# Patient Record
Sex: Male | Born: 1935 | Race: White | Hispanic: No | Marital: Married | Smoking: Former smoker
Health system: Southern US, Community
[De-identification: ages and names within clinical notes are randomized; demographics above are authoritative.]

## PROBLEM LIST (undated history)

## (undated) DIAGNOSIS — R351 Nocturia: Secondary | ICD-10-CM

## (undated) DIAGNOSIS — C61 Malignant neoplasm of prostate: Secondary | ICD-10-CM

## (undated) DIAGNOSIS — N3281 Overactive bladder: Secondary | ICD-10-CM

## (undated) DIAGNOSIS — E119 Type 2 diabetes mellitus without complications: Secondary | ICD-10-CM

## (undated) DIAGNOSIS — K8689 Other specified diseases of pancreas: Secondary | ICD-10-CM

## (undated) DIAGNOSIS — R32 Unspecified urinary incontinence: Secondary | ICD-10-CM

## (undated) DIAGNOSIS — S72001A Fracture of unspecified part of neck of right femur, initial encounter for closed fracture: Secondary | ICD-10-CM

## (undated) DIAGNOSIS — N400 Enlarged prostate without lower urinary tract symptoms: Secondary | ICD-10-CM

## (undated) HISTORY — DX: Unspecified urinary incontinence: R32

## (undated) HISTORY — PX: CATARACT EXTRACTION, BILATERAL: SHX1313

## (undated) HISTORY — PX: CHOLECYSTECTOMY: SHX55

## (undated) HISTORY — DX: Overactive bladder: N32.81

## (undated) HISTORY — DX: Nocturia: R35.1

## (undated) HISTORY — PX: HERNIA REPAIR: SHX51

## (undated) HISTORY — DX: Malignant neoplasm of prostate: C61

## (undated) HISTORY — DX: Benign prostatic hyperplasia without lower urinary tract symptoms: N40.0

## (undated) HISTORY — PX: PROSTATECTOMY: SHX69

---

## 2005-08-04 ENCOUNTER — Ambulatory Visit: Payer: Self-pay | Admitting: Oncology

## 2006-02-05 ENCOUNTER — Ambulatory Visit: Payer: Self-pay | Admitting: Radiation Oncology

## 2010-09-03 ENCOUNTER — Ambulatory Visit: Payer: Self-pay | Admitting: Ophthalmology

## 2010-09-18 ENCOUNTER — Ambulatory Visit: Payer: Self-pay | Admitting: Ophthalmology

## 2013-04-08 ENCOUNTER — Emergency Department: Payer: Self-pay | Admitting: Emergency Medicine

## 2014-08-25 DIAGNOSIS — I429 Cardiomyopathy, unspecified: Secondary | ICD-10-CM | POA: Insufficient documentation

## 2014-08-25 DIAGNOSIS — Z8546 Personal history of malignant neoplasm of prostate: Secondary | ICD-10-CM | POA: Insufficient documentation

## 2015-03-15 ENCOUNTER — Encounter: Payer: Self-pay | Admitting: Urology

## 2015-03-15 ENCOUNTER — Ambulatory Visit (INDEPENDENT_AMBULATORY_CARE_PROVIDER_SITE_OTHER): Payer: Medicare Other | Admitting: Urology

## 2015-03-15 VITALS — BP 169/74 | HR 65 | Ht 69.0 in | Wt 159.8 lb

## 2015-03-15 DIAGNOSIS — C61 Malignant neoplasm of prostate: Secondary | ICD-10-CM

## 2015-03-15 DIAGNOSIS — N3281 Overactive bladder: Secondary | ICD-10-CM

## 2015-03-15 DIAGNOSIS — N393 Stress incontinence (female) (male): Secondary | ICD-10-CM | POA: Diagnosis not present

## 2015-03-15 LAB — URINALYSIS, COMPLETE
BILIRUBIN UA: NEGATIVE
GLUCOSE, UA: NEGATIVE
KETONES UA: NEGATIVE
Leukocytes, UA: NEGATIVE
NITRITE UA: NEGATIVE
Protein, UA: NEGATIVE
UUROB: 0.2 mg/dL (ref 0.2–1.0)
pH, UA: 5 (ref 5.0–7.5)

## 2015-03-15 LAB — MICROSCOPIC EXAMINATION: Bacteria, UA: NONE SEEN

## 2015-03-15 NOTE — Progress Notes (Signed)
03/15/2015 3:35 PM   Gerald Alvarez. March 02, 1926 [id number]  Referring provider: Rusty Aus, MD [place], [place] [address]  Chief Complaint  Patient presents with  . Prostate Cancer    1 yr f/u. Pt complains that his urinary incontinence has worsen . the pt also complains of rash around he penis     HPI: Patient will has a long history of status post radical prostatectomy for prostate cancer with negative PSAs the last 20 years. Has some problems with stress incontinence and overactive bladder does not leak at night does get up several times at night. I gave him samples of mybetriq 25 mg is as been the only anticholinergic that helped him. The other anticholinergics caused too much constipation and he wants to have a bowel movement daily basis.     PMH: Past Medical History  Diagnosis Date  . OAB (overactive bladder)   . Incontinence   . Nocturia   . BPH (benign prostatic hyperplasia)   . Prostate cancer     Surgical History: Past Surgical History  Procedure Laterality Date  . Cholecystectomy    . Hernia repair      Home Medications:    Medication List       This list is accurate as of: 03/15/15  3:35 PM.  Always use your most recent med list.               ALPRAZolam 0.5 MG tablet  Commonly known as:  XANAX  Take by mouth.     aspirin 81 MG chewable tablet  Chew by mouth.     Azelastine HCl 0.15 % Soln  Place into the nose.     metoprolol tartrate 25 MG tablet  Commonly known as:  LOPRESSOR  Take by mouth.     MULTI-DAY VITAMINS Tabs  Take by mouth.     pantoprazole 40 MG tablet  Commonly known as:  PROTONIX  TAKE 1 TABLET TWICE A DAY        Allergies:  Allergies  Allergen Reactions  . Sulfa Antibiotics Nausea And Vomiting    Family History: No family history on file.  Social History:  reports that he quit smoking about 48 years ago. He does not have any smokeless tobacco history on file. He reports that he does  not drink alcohol or use illicit drugs.  BSJ:[id number] Symptom Review    Patient is experiencing the following symptoms: Frequent urination Leakage of urine Weak stream Erection problems (male only)   Review of Systems  Gastrointestinal (upper)  : Negative for upper GI symptoms  Gastrointestinal (lower) : Negative for lower GI symptoms  Constitutional : Negative for symptoms  Skin: Negative for skin symptoms  Eyes: Negative for eye symptoms  Ear/Nose/Throat : Negative for Ear/Nose/Throat symptoms  Hematologic/Lymphatic: Negative for Hematologic/Lymphatic symptoms  Cardiovascular : Negative for cardiovascular symptoms  Respiratory : Negative for respiratory symptoms  Endocrine: Negative for endocrine symptoms  Musculoskeletal: Back pain  Neurological: Negative for neurological symptoms  Psychologic: Anxiety   Physical Exam: BP 169/74 mmHg  Pulse 65  Ht 5\' 9"  (1.753 m)  Wt 159 lb 12.8 oz (72.485 kg)  BMI 23.59 kg/m2  Constitutional:  Alert and oriented, No acute distress. HEENT: [name] AT, moist mucus membranes.  Trachea midline, no masses. Cardiovascular: No clubbing, cyanosis, or edema. Respiratory: Normal respiratory effort, no increased work of breathing. GI: Abdomen is soft, nontender, nondistended, no abdominal masses GU: No CVA tenderness. Prostate absent rectal sphincter  good noncircumcised but retractable foreskin testes descended and normal Skin: No rashes, bruises or suspicious lesions. Lymph: No cervical or inguinal adenopathy. Neurologic: Grossly intact, no focal deficits, moving all 4 extremities. Psychiatric: Normal mood and affect.  Laboratory Data: No results found for: WBC, HGB, HCT, MCV, PLT  No results found for: CREATININE  No results found for: PSA  No results found for: TESTOSTERONE  No results found for: HGBA1C  Urinalysis No results found for: COLORURINE, APPEARANCEUR, LABSPEC, PHURINE, GLUCOSEU, HGBUR,  BILIRUBINUR, KETONESUR, PROTEINUR, UROBILINOGEN, NITRITE, LEUKOCYTESUR  Pertinent Imaging: None   Assessment & Plan:  status post prostatectomy with no recurrence of disease and a negative rectal exam. PSA drawn.  1. Prostate cancer  Await results- PSA  2. Stress incontinence  - continued stress incontinence but not at night. Overactive bladde  only at night Urinalysis, Complete  3. OAB (overactive bladder)    No Follow-up on file.  Collier Flowers, [facility] [address] [place], [place] [address] [phone]

## 2015-03-16 LAB — PSA

## 2016-03-10 ENCOUNTER — Encounter: Payer: Self-pay | Admitting: Urology

## 2016-03-10 ENCOUNTER — Ambulatory Visit (INDEPENDENT_AMBULATORY_CARE_PROVIDER_SITE_OTHER): Payer: Medicare Other | Admitting: Urology

## 2016-03-10 VITALS — BP 183/72 | HR 64 | Ht 67.0 in | Wt 152.0 lb

## 2016-03-10 DIAGNOSIS — C61 Malignant neoplasm of prostate: Secondary | ICD-10-CM

## 2016-03-10 DIAGNOSIS — N200 Calculus of kidney: Secondary | ICD-10-CM | POA: Insufficient documentation

## 2016-03-10 DIAGNOSIS — H9313 Tinnitus, bilateral: Secondary | ICD-10-CM | POA: Insufficient documentation

## 2016-03-10 DIAGNOSIS — N3281 Overactive bladder: Secondary | ICD-10-CM

## 2016-03-10 DIAGNOSIS — N393 Stress incontinence (female) (male): Secondary | ICD-10-CM | POA: Diagnosis not present

## 2016-03-10 DIAGNOSIS — K5790 Diverticulosis of intestine, part unspecified, without perforation or abscess without bleeding: Secondary | ICD-10-CM | POA: Insufficient documentation

## 2016-03-10 MED ORDER — MIRABEGRON ER 25 MG PO TB24: 25.0000 mg | ORAL_TABLET | Freq: Every day | ORAL | Status: DC

## 2016-03-10 NOTE — Progress Notes (Signed)
03/10/2016 10:02 AM   Gerald Alvarez. December 16, 1925 [id number]  Referring provider: No referring provider defined for this encounter.  Chief Complaint  Patient presents with  . Prostate Cancer    1year     HPI: The patient is a 82 year old male with a history of a radical prostatectomy approximately 20 years ago presents for annual follow-up. His pathology is not available to me at this time. However, he had a PSA drawn last year that was undetectable.    He has stress urinary incontinence. He uses 3-4 pads daily. He wears one at night. Does not leak at night. He also has urinary frequency. He has been on Myrbetriq in the past and this is helped with his leakage as well as his frequency. He does however denied urge incontinence. He was not able to afford his medications would not purchase. He was unable to tolerate anticholinergics severe problems with constipation. He would like to try samples of this medication again.   PMH: Past Medical History  Diagnosis Date  . OAB (overactive bladder)   . Incontinence   . Nocturia   . BPH (benign prostatic hyperplasia)   . Prostate cancer [facility])     Surgical History: Past Surgical History  Procedure Laterality Date  . Cholecystectomy    . Hernia repair      Home Medications:    Medication List       This list is accurate as of: 03/10/16 10:02 AM.  Always use your most recent med list.               ALPRAZolam 0.5 MG tablet  Commonly known as:  XANAX  Take by mouth.     aspirin 81 MG chewable tablet  Chew by mouth.     metoprolol tartrate 25 MG tablet  Commonly known as:  LOPRESSOR  Take by mouth.     MULTI-DAY VITAMINS Tabs  Take by mouth.     pantoprazole 40 MG tablet  Commonly known as:  PROTONIX  TAKE 1 TABLET TWICE A DAY        Allergies:  Allergies  Allergen Reactions  . Sulfa Antibiotics Nausea And Vomiting    Family History: History reviewed. No pertinent family history.  Social History:   reports that he quit smoking about 49 years ago. He does not have any smokeless tobacco history on file. He reports that he does not drink alcohol or use illicit drugs.  ROS: UROLOGY Frequent Urination?: No Hard to postpone urination?: No Burning/pain with urination?: No Get up at night to urinate?: No Leakage of urine?: Yes Urine stream starts and stops?: No Trouble starting stream?: No Do you have to strain to urinate?: No Blood in urine?: No Urinary tract infection?: No Sexually transmitted disease?: No Injury to kidneys or bladder?: No Painful intercourse?: No Weak stream?: No Erection problems?: No Penile pain?: No  Gastrointestinal Nausea?: No Vomiting?: No Indigestion/heartburn?: No Diarrhea?: No Constipation?: No  Constitutional Fever: No Night sweats?: No Weight loss?: No Fatigue?: No  Skin Skin rash/lesions?: No Itching?: No  Eyes Blurred vision?: No Double vision?: No  Ears/Nose/Throat Sore throat?: No Sinus problems?: No  Hematologic/Lymphatic Swollen glands?: No Easy bruising?: No  Cardiovascular Leg swelling?: No Chest pain?: No  Respiratory Cough?: No Shortness of breath?: No  Endocrine Excessive thirst?: No  Musculoskeletal Back pain?: No Joint pain?: No  Neurological Headaches?: No Dizziness?: No  Psychologic Depression?: No Anxiety?: No  Physical Exam: BP 183/72 mmHg  Pulse 64  Ht 5\' 7"  (1.702 m)  Wt 152 lb (68.947 kg)  BMI 23.80 kg/m2  Constitutional:  Alert and oriented, No acute distress. HEENT: [place] AT, moist mucus membranes.  Trachea midline, no masses. Cardiovascular: No clubbing, cyanosis, or edema. Respiratory: Normal respiratory effort, no increased work of breathing. GI: Abdomen is soft, nontender, nondistended, no abdominal masses GU: No CVA tenderness. Uncircumcised phallus. Mild irritation of glans. No sign of infection. Skin: No rashes, bruises or suspicious lesions. Lymph: No cervical or inguinal  adenopathy. Neurologic: Grossly intact, no focal deficits, moving all 4 extremities. Psychiatric: Normal mood and affect.  Laboratory Data: No results found for: WBC, HGB, HCT, MCV, PLT  No results found for: CREATININE  No results found for: PSA  No results found for: TESTOSTERONE  No results found for: HGBA1C  Urinalysis    Component Value Date/Time   APPEARANCEUR Clear 03/15/2015 1517   GLUCOSEU Negative 03/15/2015 1517   BILIRUBINUR Negative 03/15/2015 1517   PROTEINUR Negative 03/15/2015 1517   NITRITE Negative 03/15/2015 1517   LEUKOCYTESUR Negative 03/15/2015 1517     Assessment & Plan:    1. History of prostate cancer -check PSA today  2. Overactive bladder -Patient was given samples of Myrbetric as this has worked in the past. A Rx was sent to his pharmacy for Myrbetriq 25 mg daily.  3. SUI -continue pads    Return in about 1 year (around 03/10/2017).  Nickie Retort, MD  [facility] [address] [place], [place] [address] [phone]

## 2016-03-11 LAB — PSA: Prostate Specific Ag, Serum: <0.1 ng/mL (ref 0.0–4.0)

## 2016-03-14 ENCOUNTER — Ambulatory Visit: Payer: Self-pay | Admitting: Urology

## 2016-03-14 ENCOUNTER — Ambulatory Visit: Payer: Self-pay

## 2016-03-14 ENCOUNTER — Telehealth: Payer: Self-pay

## 2016-03-14 NOTE — Telephone Encounter (Signed)
-----   Message from Nickie Retort, MD sent at 03/13/2016 10:19 AM EDT ----- Please let patient know PSA remains undetectable. F/u as scheduled

## 2016-03-14 NOTE — Telephone Encounter (Signed)
LMOM- most recent labs are undetectable.

## 2016-07-18 ENCOUNTER — Ambulatory Visit
Admission: RE | Admit: 2016-07-18 | Discharge: 2016-07-18 | Disposition: A | Discharge status: Home / Self Care | Payer: Medicare Other | Source: Ambulatory Visit | Attending: Internal Medicine | Admitting: Internal Medicine

## 2016-07-18 ENCOUNTER — Other Ambulatory Visit: Payer: Self-pay | Admitting: Internal Medicine

## 2016-07-18 DIAGNOSIS — R55 Syncope and collapse: Secondary | ICD-10-CM | POA: Diagnosis not present

## 2016-07-18 DIAGNOSIS — I6523 Occlusion and stenosis of bilateral carotid arteries: Secondary | ICD-10-CM | POA: Insufficient documentation

## 2016-09-04 DIAGNOSIS — Z0000 Encounter for general adult medical examination without abnormal findings: Secondary | ICD-10-CM | POA: Insufficient documentation

## 2016-09-09 ENCOUNTER — Other Ambulatory Visit: Payer: Self-pay

## 2016-10-16 ENCOUNTER — Ambulatory Visit: Admission: RE | Admit: 2016-10-16 | Payer: Medicare Other | Source: Ambulatory Visit

## 2016-10-16 ENCOUNTER — Ambulatory Visit
Admission: RE | Admit: 2016-10-16 | Discharge: 2016-10-16 | Disposition: A | Discharge status: Home / Self Care | Payer: Medicare Other | Source: Ambulatory Visit | Attending: Internal Medicine | Admitting: Internal Medicine

## 2016-10-16 ENCOUNTER — Other Ambulatory Visit: Payer: Self-pay | Admitting: Internal Medicine

## 2016-10-16 DIAGNOSIS — R1084 Generalized abdominal pain: Secondary | ICD-10-CM | POA: Diagnosis not present

## 2016-10-16 DIAGNOSIS — I700 Atherosclerosis of aorta: Secondary | ICD-10-CM | POA: Diagnosis not present

## 2016-10-16 DIAGNOSIS — M438X4 Other specified deforming dorsopathies, thoracic region: Secondary | ICD-10-CM | POA: Insufficient documentation

## 2016-10-16 DIAGNOSIS — K769 Liver disease, unspecified: Secondary | ICD-10-CM | POA: Diagnosis not present

## 2016-10-16 MED ORDER — IOPAMIDOL (ISOVUE-300) INJECTION 61%
100.0000 mL | Freq: Once | INTRAVENOUS | Status: AC | PRN
  Administered 2016-10-16: 100 mL via INTRAVENOUS

## 2016-10-23 DIAGNOSIS — E538 Deficiency of other specified B group vitamins: Secondary | ICD-10-CM | POA: Insufficient documentation

## 2016-12-31 ENCOUNTER — Other Ambulatory Visit: Payer: Self-pay | Admitting: Internal Medicine

## 2016-12-31 DIAGNOSIS — R1031 Right lower quadrant pain: Secondary | ICD-10-CM

## 2017-01-04 ENCOUNTER — Emergency Department
Admission: EM | Admit: 2017-01-04 | Discharge: 2017-01-04 | Disposition: A | Discharge status: Home / Self Care | Payer: Medicare Other | Attending: Emergency Medicine | Admitting: Emergency Medicine

## 2017-01-04 ENCOUNTER — Emergency Department: Payer: Medicare Other

## 2017-01-04 ENCOUNTER — Encounter: Payer: Self-pay | Admitting: Radiology

## 2017-01-04 DIAGNOSIS — Z8546 Personal history of malignant neoplasm of prostate: Secondary | ICD-10-CM | POA: Diagnosis not present

## 2017-01-04 DIAGNOSIS — M4854XA Collapsed vertebra, not elsewhere classified, thoracic region, initial encounter for fracture: Secondary | ICD-10-CM | POA: Insufficient documentation

## 2017-01-04 DIAGNOSIS — Z7982 Long term (current) use of aspirin: Secondary | ICD-10-CM | POA: Diagnosis not present

## 2017-01-04 DIAGNOSIS — R0789 Other chest pain: Secondary | ICD-10-CM | POA: Diagnosis not present

## 2017-01-04 DIAGNOSIS — Z87891 Personal history of nicotine dependence: Secondary | ICD-10-CM | POA: Diagnosis not present

## 2017-01-04 DIAGNOSIS — R1084 Generalized abdominal pain: Secondary | ICD-10-CM | POA: Insufficient documentation

## 2017-01-04 DIAGNOSIS — S22000A Wedge compression fracture of unspecified thoracic vertebra, initial encounter for closed fracture: Secondary | ICD-10-CM

## 2017-01-04 DIAGNOSIS — R109 Unspecified abdominal pain: Secondary | ICD-10-CM

## 2017-01-04 DIAGNOSIS — Z79899 Other long term (current) drug therapy: Secondary | ICD-10-CM | POA: Insufficient documentation

## 2017-01-04 LAB — URINALYSIS, ROUTINE W REFLEX MICROSCOPIC
BACTERIA UA: NONE SEEN
BILIRUBIN URINE: NEGATIVE
Glucose, UA: NEGATIVE mg/dL
KETONES UR: NEGATIVE mg/dL
Leukocytes, UA: NEGATIVE
NITRITE: NEGATIVE
PROTEIN: NEGATIVE mg/dL
Specific Gravity, Urine: 1.021 (ref 1.005–1.030)
Squamous Epithelial / LPF: NONE SEEN
pH: 5 (ref 5.0–8.0)

## 2017-01-04 LAB — CBC
HCT: 43.2 % (ref 40.0–52.0)
HEMOGLOBIN: 14.9 g/dL (ref 13.0–18.0)
MCH: 35.1 pg — ABNORMAL HIGH (ref 26.0–34.0)
MCHC: 34.5 g/dL (ref 32.0–36.0)
MCV: 101.6 fL — ABNORMAL HIGH (ref 80.0–100.0)
PLATELETS: 250 10*3/uL (ref 150–440)
RBC: 4.25 MIL/uL — AB (ref 4.40–5.90)
RDW: 13.4 % (ref 11.5–14.5)
WBC: 5.6 10*3/uL (ref 3.8–10.6)

## 2017-01-04 LAB — BASIC METABOLIC PANEL
Anion gap: 9 (ref 5–15)
BUN: 16 mg/dL (ref 6–20)
CHLORIDE: 102 mmol/L (ref 101–111)
CO2: 24 mmol/L (ref 22–32)
CREATININE: 0.9 mg/dL (ref 0.61–1.24)
Calcium: 8.8 mg/dL — ABNORMAL LOW (ref 8.9–10.3)
GFR calc Af Amer: >60 mL/min (ref >60)
GFR calc non Af Amer: >60 mL/min (ref >60)
Glucose, Bld: 181 mg/dL — ABNORMAL HIGH (ref 65–99)
POTASSIUM: 4.1 mmol/L (ref 3.5–5.1)
SODIUM: 135 mmol/L (ref 135–145)

## 2017-01-04 LAB — TROPONIN I

## 2017-01-04 LAB — LIPASE, BLOOD: LIPASE: 20 U/L (ref 11–51)

## 2017-01-04 LAB — HEPATIC FUNCTION PANEL
ALT: 15 U/L — ABNORMAL LOW (ref 17–63)
AST: 19 U/L (ref 15–41)
Albumin: 3.6 g/dL (ref 3.5–5.0)
Alkaline Phosphatase: 66 U/L (ref 38–126)
Total Bilirubin: 0.9 mg/dL (ref 0.3–1.2)
Total Protein: 6.9 g/dL (ref 6.5–8.1)

## 2017-01-04 MED ORDER — CYCLOBENZAPRINE HCL 10 MG PO TABS: 10.0000 mg | ORAL_TABLET | Freq: Three times a day (TID) | ORAL | 0 refills | Status: DC | PRN

## 2017-01-04 MED ORDER — CYCLOBENZAPRINE HCL 10 MG PO TABS
10.0000 mg | ORAL_TABLET | Freq: Once | ORAL | Status: AC
  Administered 2017-01-04: 10 mg via ORAL
  Filled 2017-01-04: qty 1

## 2017-01-04 MED ORDER — HYDROCODONE-ACETAMINOPHEN 5-325 MG PO TABS: 1.0000 | ORAL_TABLET | Freq: Four times a day (QID) | ORAL | 0 refills | Status: DC | PRN

## 2017-01-04 MED ORDER — IOPAMIDOL (ISOVUE-370) INJECTION 76%
125.0000 mL | Freq: Once | INTRAVENOUS | Status: AC | PRN
  Administered 2017-01-04: 100 mL via INTRAVENOUS

## 2017-01-04 NOTE — ED Notes (Signed)
Pt c/o abdominal pain and bilateral lower back pain, hurts worse when standing. Abdomen tender and distended.  Reports taking MOM a couple of days ago and had BM on 4/28.  Pt is alert and oriented x 4.

## 2017-01-04 NOTE — ED Notes (Signed)
Patient transported to CT 

## 2017-01-04 NOTE — ED Triage Notes (Signed)
Patient reports having left sided chest pain and right lower abdominal pain off/on for 2 weeks.  Reports pain was worse during the night.

## 2017-01-04 NOTE — ED Notes (Signed)
CT notified patient has an IV

## 2017-01-04 NOTE — Discharge Instructions (Signed)
You were evaluated for chest and abdominal back pain, and although no certain cause was found, your exam and evaluation are reassuring in the emergency department today.  As we discussed, I suspect muscle spasm may be responsible for the intermittent severe pains that are now gone. He may try a muscle relaxer Flexeril.  If you have any severe pain, I will prescribe Norco, a narcotic for severe pain as needed.  Beware not to take at the same time due to sedation side effect.    As we discussed, please follow-up with your primary care doctor for further investigation which might include gastroenterology consultation for girdle ulcers, orthopedic surgeon consultation for possible intervention on T12 vertebral fracture, and I would also recommend seeing a cardiologist due to the nonspecific chest discomfort.  Return to the emergency room immediately for any worsening chest pain, abdominal pain, cough, trouble breathing, skin rash, weakness, numbness, or any other symptoms concerning to you.

## 2017-01-04 NOTE — ED Provider Notes (Signed)
[facility] Emergency Department Provider Note ____________________________________________   I have reviewed the triage vital signs and the triage nursing note.  HISTORY  Chief Complaint Chest Pain and Abdominal Pain   Historian Patient, wife and two daughters Patient somewhat poor historian  HPI Gerald Alvarez. is a 83 y.o. male presents with his wife and 2 daughters for acute worsening of pain that he's been having somewhat chronically but slightly intermittently for several months. However last night around 8 PM his wife reports he was in much more severe pain in his back and right flank and then overnight complained of chest pain. At times he'll have trouble breathing. Patient is a history of kidney stones, but states this does not fairly feel like a kidney stone. He feels like his abdomen is bloated. Pain at times was severe, currently moderate reading 6 out of 10 although he is declining any pain medication at this point in time. No nausea or vomiting. Denies constant patient diarrhea. He's been taking tramadol for pain, prescribed by primary care physician Dr. Sabra Heck.  History of prostate cancer over 20years ago    Past Medical History:  Diagnosis Date  . BPH (benign prostatic hyperplasia)   . Incontinence   . Nocturia   . OAB (overactive bladder)   . Prostate cancer [facility])     Patient Active Problem List   Diagnosis Date Noted  . DD (diverticular disease) 03/10/2016  . Calculus of kidney 03/10/2016  . Bilateral tinnitus 03/10/2016  . Cardiomyopathy ([place]) 08/25/2014  . H/O malignant neoplasm of prostate 08/25/2014    Past Surgical History:  Procedure Laterality Date  . CHOLECYSTECTOMY    . HERNIA REPAIR      Prior to Admission medications   Medication Sig Start Date End Date Taking? Authorizing Provider  ALPRAZolam Duanne Moron) 0.5 MG tablet Take by mouth. 08/25/14   Historical Provider, MD  aspirin 81 MG chewable tablet Chew by mouth.     Historical Provider, MD  metoprolol tartrate (LOPRESSOR) 25 MG tablet Take by mouth. 01/15/15 01/15/16  Historical Provider, MD  mirabegron ER (MYRBETRIQ) 25 MG TB24 tablet Take 1 tablet (25 mg total) by mouth daily. 03/10/16   Nickie Retort, MD  Multiple Vitamin (MULTI-DAY VITAMINS) TABS Take by mouth.    Historical Provider, MD  pantoprazole (PROTONIX) 40 MG tablet TAKE 1 TABLET TWICE A DAY 02/08/15   Historical Provider, MD    Allergies  Allergen Reactions  . Sulfa Antibiotics Nausea And Vomiting    No family history on file.  Social History Social History  Substance Use Topics  . Smoking status: Former Smoker    Quit date: 03/15/1967  . Smokeless tobacco: Not on file  . Alcohol use No    Review of Systems  Constitutional: Negative for fever. Eyes: Negative for visual changes. ENT: Negative for sore throat. Cardiovascular: positivefor chest pain. Respiratory: positive for occasional, but no currentshortness of breath. Gastrointestinal: Negative for  vomiting and diarrhea. Genitourinary: Negative for dysuria. Musculoskeletal: positivefor back pain. Skin: Negative for rash. Neurological: Negative for headache. 10 point Review of Systems otherwise negative ____________________________________________   PHYSICAL EXAM:  VITAL SIGNS: ED Triage Vitals  Enc Vitals Group     BP 01/04/17 0700 (!) 148/67     Pulse Rate 01/04/17 0700 81     Resp 01/04/17 0700 20     Temp 01/04/17 0700 97.8 F (36.6 C)     Temp Source 01/04/17 0700 Oral     SpO2 01/04/17  0700 95 %     Weight 01/04/17 0700 163 lb (73.9 kg)     Height 01/04/17 0700 5\' 10"  (1.778 m)     Head Circumference --      Peak Flow --      Pain Score 01/04/17 0652 6     Pain Loc --      Pain Edu? --      Excl. in [place]? --      Constitutional: Alert and oriented. Well appearing and in no distress. HEENT   Head: Normocephalic and atraumatic.      Eyes: Conjunctivae are normal. PERRL. Normal extraocular  movements.      Ears:         Nose: No congestion/rhinnorhea.   Mouth/Throat: Mucous membranes are moist.   Neck: No stridor. Cardiovascular/Chest: Normal rate, regular rhythm.  No murmurs, rubs, or gallops. Respiratory: Normal respiratory effort without tachypnea nor retractions. Breath sounds are clear and equal bilaterally. No wheezes/rales/rhonchi. Gastrointestinal: Soft. Somewhat distended. Moderate tenderness diffusely. No guarding or rebound.  Genitourinary/rectal:Deferred Musculoskeletal: Nontender with normal range of motion in all extremities. No joint effusions.  No lower extremity tenderness.  No edema. Neurologic:  Normal speech and language. No gross or focal neurologic deficits are appreciated. Skin:  Skin is warm, dry and intact. No rash noted. Psychiatric: Mood and affect are normal. Speech and behavior are normal. Patient exhibits appropriate insight and judgment.   ____________________________________________  LABS (pertinent positives/negatives)  Labs Reviewed  BASIC METABOLIC PANEL - Abnormal; Notable for the following:       Result Value   Glucose, Bld 181 (*)    Calcium 8.8 (*)    All other components within normal limits  CBC - Abnormal; Notable for the following:    RBC 4.25 (*)    MCV 101.6 (*)    MCH 35.1 (*)    All other components within normal limits  URINALYSIS, ROUTINE W REFLEX MICROSCOPIC - Abnormal; Notable for the following:    Color, Urine YELLOW (*)    APPearance CLEAR (*)    Hgb urine dipstick SMALL (*)    All other components within normal limits  HEPATIC FUNCTION PANEL - Abnormal; Notable for the following:    ALT 15 (*)    Bilirubin, Direct <0.1 (*)    All other components within normal limits  TROPONIN I  LIPASE, BLOOD  TROPONIN I    ____________________________________________    EKG I, Lisa Roca, MD, the attending physician have personally viewed and interpreted all ECGs.  80 bpm normal sinus rhythm. Left  axisdeviation. Nonspecific T-wave LVH. ____________________________________________  RADIOLOGY All Xrays were viewed by me. Imaging interpreted by Radiologist.  CT angiogramchest abdomen and pelvis:  IMPRESSION: CT angiogram chest: No demonstrable thoracic aortic aneurysm or dissection. No pulmonary embolus. There is bibasilar atelectasis without frank edema or consolidation. No adenopathy. Compression fractures in the thoracic spine, most pronounced at T7. Sclerosis in the T7 vertebral body could be due to the compression but also potentially could be due to known prostate carcinoma. Note that there is mild retropulsion of bone at T12 at the site of a fracture, unchanged from prior study. No significant spinal stenosis in this region.  CT angiogram abdomen ; CT angiogram pelvis: Areas of atherosclerotic calcification in the aorta as well as in major mesenteric and pelvic arterial vessels without hemodynamically significant obstruction is. No aneurysm or dissection noted in in the abdominal or pelvic arterial vessel.  No focal liver lesions are identified on arterial  phase imaging. Indeterminate lesions noted on prior CT in the penis studies are not appreciable on this current examination. Recommendation for pre and serial post CT or MR imaging of the liver to further evaluate these lesions nonemergently remains in effect.  Scattered sigmoid diverticular without diverticulitis. No bowel obstruction or bowel wall thickening. No abscess. Appendix appears normal. Postoperative change noted in pelvis. Prostate absent. Gallbladder absent.  No evident renal or ureteral calculus. No hydronephrosis.  Stable 8 mm sclerotic focus left iliac bone of uncertain etiology. No new sclerotic lesions in abdomen or pelvis. No lytic or destructive bone lesions evident abdomen or pelvis.  There is a small ventral hernia containing only  fat. __________________________________________  PROCEDURES  Procedure(s) performed: None  Critical Care performed: None  ____________________________________________   ED COURSE / ASSESSMENT AND PLAN  Pertinent labs & imaging results that were available during my care of the patient were reviewed by me and considered in my medical decision making (see chart for details).   Mr. Carmen has been dealing with pain all over and specifically back and abdomen pain for several months, but apparently had acute worsening overnight with extension up into his chest. It's been waxing and waning is somewhat moderate at this current point in time.    Given the chest discomfort in the back discomfort, I think he needs imaging to evaluate the aorta. We did discuss that this might limit diagnosis of kidney stone, although  Description of pain seems a little unlikely for kidney stone given the chest component.  Symptoms do not seem all explained by ACS. This would be an atypical presentation for that. EKG is nonspecific.  CTA chest and abd/pel without acute finding to explain symptoms.    Discussed with patient and family.  Multiple sclerotic findings in bones, but no certain lytic or destructive findings.   T7, T2 undetermined age, compression fractures.  T12 fracture (on prior scan 50%), unchanged from Feb scan.  Consider GERD/Gastritis, esophageal spasm, body wall/muscular spasm.    Also, perhaps pain is coming from the spine compression fractures.  Will refer to see ortho and PCP can also discuss pain management options.  I don't like the idea of him being on long term narcotics.  I will however give rx for 6 tablets as an emergency relief option if uncontrolled pain.    We discussed the symptoms a little more, and it really does seem spasm like and I am going to add Flexeril for likely muscular spasm as a source of the discomfort.  However I did discuss follow-up with primary care doctor  as well as cardiologist, and consider gastroenterology if GI symptoms become most prominent, and orthopedic given the 50% height loss in the T12 vertebra.      CONSULTATIONS:   None   Patient / Family / Caregiver informed of clinical course, medical decision-making process, and agree with plan.   I discussed return precautions, follow-up instructions, and discharge instructions with patient and/or family.  Discharge Instructions:  You were evaluated for chest and abdominal back pain, and although no certain cause was found, your exam and evaluation are reassuring in the emergency department today.  As we discussed, I suspect muscle spasm may be responsible for the intermittent severe pains that are now gone. He may try a muscle relaxer Flexeril.  If you have any severe pain, I will prescribe Norco, a narcotic for severe pain as needed.  Beware not to take at the same time due to sedation side effect.  As we discussed, please follow-up with your primary care doctor for further investigation which might include gastroenterology consultation for girdle ulcers, orthopedic surgeon consultation for possible intervention on T12 vertebral fracture, and I would also recommend seeing a cardiologist due to the nonspecific chest discomfort.  Return to the emergency room immediately for any worsening chest pain, abdominal pain, cough, trouble breathing, skin rash, weakness, numbness, or any other symptoms concerning to you. ___________________________________________   FINAL CLINICAL IMPRESSION(S) / ED DIAGNOSES   Final diagnoses:  Abdominal pain, right lateral  Atypical chest pain  Compression fracture of body of thoracic vertebra [facility])              Note: This dictation was prepared with Dragon dictation. Any transcriptional errors that result from this process are unintentional    Lisa Roca, MD 01/04/17 1235

## 2017-01-04 NOTE — ED Notes (Signed)
Pt given crackers and gingerale to drink, already drank water which he has been able to keep down.

## 2017-01-05 ENCOUNTER — Other Ambulatory Visit: Payer: Self-pay | Admitting: Internal Medicine

## 2017-01-05 DIAGNOSIS — S22000A Wedge compression fracture of unspecified thoracic vertebra, initial encounter for closed fracture: Secondary | ICD-10-CM

## 2017-01-06 ENCOUNTER — Ambulatory Visit
Admission: RE | Admit: 2017-01-06 | Discharge: 2017-01-06 | Disposition: A | Discharge status: Home / Self Care | Payer: Medicare Other | Source: Ambulatory Visit | Attending: Internal Medicine | Admitting: Internal Medicine

## 2017-01-07 ENCOUNTER — Ambulatory Visit
Admission: RE | Admit: 2017-01-07 | Discharge: 2017-01-07 | Disposition: A | Discharge status: Home / Self Care | Payer: Medicare Other | Source: Ambulatory Visit | Attending: Internal Medicine | Admitting: Internal Medicine

## 2017-01-07 ENCOUNTER — Other Ambulatory Visit: Payer: Self-pay | Admitting: Internal Medicine

## 2017-01-07 DIAGNOSIS — S22000A Wedge compression fracture of unspecified thoracic vertebra, initial encounter for closed fracture: Secondary | ICD-10-CM | POA: Insufficient documentation

## 2017-01-07 DIAGNOSIS — M5134 Other intervertebral disc degeneration, thoracic region: Secondary | ICD-10-CM | POA: Diagnosis not present

## 2017-01-19 DIAGNOSIS — M4850XD Collapsed vertebra, not elsewhere classified, site unspecified, subsequent encounter for fracture with routine healing: Secondary | ICD-10-CM

## 2017-01-19 DIAGNOSIS — M818 Other osteoporosis without current pathological fracture: Secondary | ICD-10-CM | POA: Insufficient documentation

## 2017-01-19 DIAGNOSIS — IMO0001 Reserved for inherently not codable concepts without codable children: Secondary | ICD-10-CM | POA: Insufficient documentation

## 2017-03-05 DIAGNOSIS — R7989 Other specified abnormal findings of blood chemistry: Secondary | ICD-10-CM | POA: Insufficient documentation

## 2017-03-20 ENCOUNTER — Ambulatory Visit (INDEPENDENT_AMBULATORY_CARE_PROVIDER_SITE_OTHER): Payer: Medicare Other | Admitting: Urology

## 2017-03-20 ENCOUNTER — Encounter: Payer: Self-pay | Admitting: Urology

## 2017-03-20 ENCOUNTER — Ambulatory Visit: Payer: Medicare Other

## 2017-03-20 VITALS — BP 181/108 | HR 73 | Ht 64.0 in | Wt 162.4 lb

## 2017-03-20 DIAGNOSIS — C61 Malignant neoplasm of prostate: Secondary | ICD-10-CM

## 2017-03-20 DIAGNOSIS — N393 Stress incontinence (female) (male): Secondary | ICD-10-CM

## 2017-03-20 NOTE — Progress Notes (Signed)
03/20/2017 3:42 PM   Gerald Alvarez. [birth year] [id number]  Referring provider: Rusty Aus, MD [facility] [place], [place] [address]  Chief Complaint  Patient presents with  . Follow-up    1 year Prostate cancer    HPI: The patient is a 83-year-old gentleman who presents today for annual follow-up.  1. History of prostate cancer Patient had a radical prostatectomy over 20 years ago for unknown stage/grade. His last PSA in 2017 was undetectable.  2. Incontinence   He has stress urinary incontinence. He uses 4-5 pads daily. He wears one at night. Does not leak at night. He also has urinary frequency. He has been on Myrbetriq in the past and this is helped with his leakage as well as his frequency. He does however denied urge incontinence. He was unable to tolerate anticholinergics severe problems with constipation. He was restarted on Myrbetriq 25 mg last year. However, he since stopped this medication. Not interested in restarting   PMH: Past Medical History:  Diagnosis Date  . BPH (benign prostatic hyperplasia)   . Incontinence   . Nocturia   . OAB (overactive bladder)   . Prostate cancer [facility])     Surgical History: Past Surgical History:  Procedure Laterality Date  . CHOLECYSTECTOMY    . HERNIA REPAIR     x 3  . PROSTATECTOMY      Home Medications:  Allergies as of 03/20/2017      Reactions   Sulfa Antibiotics Nausea And Vomiting      Medication List       Accurate as of 03/20/17  3:42 PM. Always use your most recent med list.          acetaminophen 325 MG tablet Commonly known as:  TYLENOL Take by mouth.   ALPRAZolam 0.5 MG tablet Commonly known as:  XANAX Take by mouth.   aspirin 81 MG chewable tablet Chew by mouth.   bisoprolol 5 MG tablet Commonly known as:  ZEBETA Take by mouth.   cyclobenzaprine 10 MG tablet Commonly known as:  FLEXERIL Take 1 tablet (10 mg total) by mouth 3 (three)  times daily as needed for muscle spasms.   doxazosin 2 MG tablet Commonly known as:  CARDURA Take by mouth.   HYDROcodone-acetaminophen 5-325 MG tablet Commonly known as:  NORCO/VICODIN Take 1 tablet by mouth every 6 (six) hours as needed for moderate pain.   metoprolol tartrate 25 MG tablet Commonly known as:  LOPRESSOR Take by mouth.   mirabegron ER 25 MG Tb24 tablet Commonly known as:  MYRBETRIQ Take 1 tablet (25 mg total) by mouth daily.   MULTI-DAY VITAMINS Tabs Take by mouth.   MULTI-VITAMINS Tabs Take by mouth.   pantoprazole 40 MG tablet Commonly known as:  PROTONIX TAKE 1 TABLET TWICE A DAY   traZODone 50 MG tablet Commonly known as:  DESYREL Take by mouth.   vitamin B-12 1000 MCG tablet Commonly known as:  CYANOCOBALAMIN Take by mouth.   Vitamin D3 2000 units capsule Take by mouth.       Allergies:  Allergies  Allergen Reactions  . Sulfa Antibiotics Nausea And Vomiting    Family History: Family History  Problem Relation Age of Onset  . Prostate cancer Brother   . Bladder Cancer Brother   . Prostate cancer Brother   . Kidney cancer Neg Hx     Social History:  reports that he quit smoking about 50 years ago. He has  quit using smokeless tobacco. His smokeless tobacco use included Chew. He reports that he does not drink alcohol or use drugs.  ROS: UROLOGY Frequent Urination?: No Hard to postpone urination?: No Burning/pain with urination?: No Get up at night to urinate?: Yes Leakage of urine?: Yes Urine stream starts and stops?: No Trouble starting stream?: No Do you have to strain to urinate?: No Blood in urine?: No Urinary tract infection?: No Sexually transmitted disease?: No Injury to kidneys or bladder?: No Painful intercourse?: No Weak stream?: No Erection problems?: No Penile pain?: No  Gastrointestinal Nausea?: No Vomiting?: No Indigestion/heartburn?: No Diarrhea?: No Constipation?: No  Constitutional Fever: No Night  sweats?: No Weight loss?: No Fatigue?: Yes  Skin Skin rash/lesions?: No Itching?: No  Eyes Blurred vision?: No Double vision?: No  Ears/Nose/Throat Sore throat?: No Sinus problems?: No  Hematologic/Lymphatic Swollen glands?: No Easy bruising?: Yes  Cardiovascular Leg swelling?: Yes Chest pain?: No  Respiratory Cough?: No Shortness of breath?: No  Endocrine Excessive thirst?: No  Musculoskeletal Back pain?: No Joint pain?: No  Neurological Headaches?: No Dizziness?: Yes  Psychologic Depression?: No Anxiety?: No  Physical Exam: BP (!) 181/108   Pulse 73   Ht 5\' 4"  (1.626 m)   Wt 162 lb 6.4 oz (73.7 kg)   BMI 27.88 kg/m   Constitutional:  Alert and oriented, No acute distress. HEENT: [place] AT, moist mucus membranes.  Trachea midline, no masses. Cardiovascular: No clubbing, cyanosis, or edema. Respiratory: Normal respiratory effort, no increased work of breathing. GI: Abdomen is soft, nontender, nondistended, no abdominal masses GU: No CVA tenderness.  Skin: No rashes, bruises or suspicious lesions. Lymph: No cervical or inguinal adenopathy. Neurologic: Grossly intact, no focal deficits, moving all 4 extremities. Psychiatric: Normal mood and affect.  Laboratory Data: Lab Results  Component Value Date   WBC 5.6 01/04/2017   HGB 14.9 01/04/2017   HCT 43.2 01/04/2017   MCV 101.6 (H) 01/04/2017   PLT 250 01/04/2017    Lab Results  Component Value Date   CREATININE 0.90 01/04/2017    No results found for: PSA  No results found for: TESTOSTERONE  No results found for: HGBA1C  Urinalysis    Component Value Date/Time   COLORURINE YELLOW (A) 01/04/2017 0800   APPEARANCEUR CLEAR (A) 01/04/2017 0800   APPEARANCEUR Clear 03/15/2015 1517   LABSPEC 1.021 01/04/2017 0800   PHURINE 5.0 01/04/2017 0800   GLUCOSEU NEGATIVE 01/04/2017 0800   HGBUR SMALL (A) 01/04/2017 0800   BILIRUBINUR NEGATIVE 01/04/2017 0800   BILIRUBINUR Negative 03/15/2015 1517     KETONESUR NEGATIVE 01/04/2017 0800   PROTEINUR NEGATIVE 01/04/2017 0800   NITRITE NEGATIVE 01/04/2017 0800   LEUKOCYTESUR NEGATIVE 01/04/2017 0800   LEUKOCYTESUR Negative 03/15/2015 1517     Assessment & Plan:    1. History of prostate cancer -check PSA today  2. SUI -continue pads as needed  Return in about 1 year (around 03/20/2018).  Nickie Retort, MD  [facility] [address], [place], [place] [address] [phone]

## 2017-03-21 LAB — PSA: Prostate Specific Ag, Serum: <0.1 ng/mL (ref 0.0–4.0)

## 2017-03-30 ENCOUNTER — Telehealth: Payer: Self-pay | Admitting: Family Medicine

## 2017-03-30 NOTE — Telephone Encounter (Signed)
-----   Message from Nickie Retort, MD sent at 03/26/2017 12:47 PM EDT ----- Please let patient know PSA was undetectable. thanks   ----- Message ----- From: Orlene Erm, CMA Sent: 03/23/2017   8:11 AM To: Nickie Retort, MD    ----- Message ----- From: Interface, [facility] Lab Results In Sent: 03/21/2017   5:41 AM To: Rowe Robert Clinical

## 2017-03-30 NOTE — Telephone Encounter (Signed)
Patient notified

## 2017-08-05 ENCOUNTER — Ambulatory Visit
Admission: RE | Admit: 2017-08-05 | Discharge: 2017-08-05 | Disposition: A | Discharge status: Home / Self Care | Payer: Medicare Other | Source: Ambulatory Visit | Attending: Internal Medicine | Admitting: Internal Medicine

## 2017-08-05 ENCOUNTER — Other Ambulatory Visit: Payer: Self-pay | Admitting: Internal Medicine

## 2017-08-05 DIAGNOSIS — M4854XA Collapsed vertebra, not elsewhere classified, thoracic region, initial encounter for fracture: Secondary | ICD-10-CM | POA: Insufficient documentation

## 2017-08-05 DIAGNOSIS — R1312 Dysphagia, oropharyngeal phase: Secondary | ICD-10-CM

## 2017-08-05 DIAGNOSIS — K5730 Diverticulosis of large intestine without perforation or abscess without bleeding: Secondary | ICD-10-CM | POA: Diagnosis not present

## 2017-08-05 DIAGNOSIS — R1084 Generalized abdominal pain: Secondary | ICD-10-CM

## 2017-08-05 DIAGNOSIS — K7689 Other specified diseases of liver: Secondary | ICD-10-CM | POA: Diagnosis not present

## 2017-08-05 DIAGNOSIS — I700 Atherosclerosis of aorta: Secondary | ICD-10-CM | POA: Insufficient documentation

## 2017-08-05 LAB — POCT I-STAT CREATININE: Creatinine, Ser: 1 mg/dL (ref 0.61–1.24)

## 2017-08-05 MED ORDER — IOPAMIDOL (ISOVUE-300) INJECTION 61%
100.0000 mL | Freq: Once | INTRAVENOUS | Status: AC | PRN
  Administered 2017-08-05: 100 mL via INTRAVENOUS

## 2017-09-25 ENCOUNTER — Inpatient Hospital Stay
Admission: EM | Admit: 2017-09-25 | Discharge: 2017-09-28 | DRG: 374 | Disposition: A | Discharge status: Home / Self Care | Payer: Medicare Other | Attending: Internal Medicine | Admitting: Internal Medicine

## 2017-09-25 ENCOUNTER — Emergency Department: Payer: Medicare Other

## 2017-09-25 ENCOUNTER — Other Ambulatory Visit: Payer: Self-pay

## 2017-09-25 DIAGNOSIS — E860 Dehydration: Secondary | ICD-10-CM

## 2017-09-25 DIAGNOSIS — N3281 Overactive bladder: Secondary | ICD-10-CM | POA: Diagnosis present

## 2017-09-25 DIAGNOSIS — Z8546 Personal history of malignant neoplasm of prostate: Secondary | ICD-10-CM | POA: Diagnosis not present

## 2017-09-25 DIAGNOSIS — F411 Generalized anxiety disorder: Secondary | ICD-10-CM | POA: Diagnosis present

## 2017-09-25 DIAGNOSIS — Z8042 Family history of malignant neoplasm of prostate: Secondary | ICD-10-CM

## 2017-09-25 DIAGNOSIS — N401 Enlarged prostate with lower urinary tract symptoms: Secondary | ICD-10-CM | POA: Diagnosis present

## 2017-09-25 DIAGNOSIS — K869 Disease of pancreas, unspecified: Secondary | ICD-10-CM | POA: Diagnosis present

## 2017-09-25 DIAGNOSIS — E119 Type 2 diabetes mellitus without complications: Secondary | ICD-10-CM | POA: Diagnosis present

## 2017-09-25 DIAGNOSIS — F329 Major depressive disorder, single episode, unspecified: Secondary | ICD-10-CM | POA: Diagnosis present

## 2017-09-25 DIAGNOSIS — Z882 Allergy status to sulfonamides status: Secondary | ICD-10-CM

## 2017-09-25 DIAGNOSIS — Z79899 Other long term (current) drug therapy: Secondary | ICD-10-CM | POA: Diagnosis not present

## 2017-09-25 DIAGNOSIS — E440 Moderate protein-calorie malnutrition: Secondary | ICD-10-CM | POA: Diagnosis present

## 2017-09-25 DIAGNOSIS — Z7982 Long term (current) use of aspirin: Secondary | ICD-10-CM | POA: Diagnosis not present

## 2017-09-25 DIAGNOSIS — R17 Unspecified jaundice: Secondary | ICD-10-CM | POA: Diagnosis not present

## 2017-09-25 DIAGNOSIS — Z6826 Body mass index (BMI) 26.0-26.9, adult: Secondary | ICD-10-CM

## 2017-09-25 DIAGNOSIS — D490 Neoplasm of unspecified behavior of digestive system: Principal | ICD-10-CM | POA: Diagnosis present

## 2017-09-25 DIAGNOSIS — Z8052 Family history of malignant neoplasm of bladder: Secondary | ICD-10-CM

## 2017-09-25 DIAGNOSIS — K831 Obstruction of bile duct: Secondary | ICD-10-CM

## 2017-09-25 DIAGNOSIS — R351 Nocturia: Secondary | ICD-10-CM | POA: Diagnosis present

## 2017-09-25 DIAGNOSIS — K8689 Other specified diseases of pancreas: Secondary | ICD-10-CM

## 2017-09-25 DIAGNOSIS — Z87891 Personal history of nicotine dependence: Secondary | ICD-10-CM

## 2017-09-25 HISTORY — DX: Type 2 diabetes mellitus without complications: E11.9

## 2017-09-25 LAB — URINALYSIS, COMPLETE (UACMP) WITH MICROSCOPIC
BACTERIA UA: NONE SEEN
GLUCOSE, UA: NEGATIVE mg/dL
KETONES UR: NEGATIVE mg/dL
Leukocytes, UA: NEGATIVE
Nitrite: NEGATIVE
PROTEIN: 30 mg/dL — AB
Specific Gravity, Urine: 1.021 (ref 1.005–1.030)
pH: 5 (ref 5.0–8.0)

## 2017-09-25 LAB — CBC WITH DIFFERENTIAL/PLATELET
BASOS PCT: 1 %
Basophils Absolute: 0.1 10*3/uL (ref 0–0.1)
EOS ABS: 0.3 10*3/uL (ref 0–0.7)
EOS PCT: 6 %
HCT: 44.4 % (ref 40.0–52.0)
HEMOGLOBIN: 15.2 g/dL (ref 13.0–18.0)
Lymphocytes Relative: 27 %
Lymphs Abs: 1.5 10*3/uL (ref 1.0–3.6)
MCH: 34.6 pg — ABNORMAL HIGH (ref 26.0–34.0)
MCHC: 34.1 g/dL (ref 32.0–36.0)
MCV: 101.4 fL — ABNORMAL HIGH (ref 80.0–100.0)
MONOS PCT: 9 %
Monocytes Absolute: 0.5 10*3/uL (ref 0.2–1.0)
NEUTROS PCT: 57 %
Neutro Abs: 3.2 10*3/uL (ref 1.4–6.5)
PLATELETS: 267 10*3/uL (ref 150–440)
RBC: 4.38 MIL/uL — AB (ref 4.40–5.90)
RDW: 15.1 % — ABNORMAL HIGH (ref 11.5–14.5)
WBC: 5.5 10*3/uL (ref 3.8–10.6)

## 2017-09-25 LAB — COMPREHENSIVE METABOLIC PANEL
ALT: 814 U/L — AB (ref 17–63)
ANION GAP: 10 (ref 5–15)
AST: 587 U/L — ABNORMAL HIGH (ref 15–41)
Albumin: 3.4 g/dL — ABNORMAL LOW (ref 3.5–5.0)
Alkaline Phosphatase: 534 U/L — ABNORMAL HIGH (ref 38–126)
BUN: 24 mg/dL — ABNORMAL HIGH (ref 6–20)
CHLORIDE: 99 mmol/L — AB (ref 101–111)
CO2: 25 mmol/L (ref 22–32)
CREATININE: 0.55 mg/dL — AB (ref 0.61–1.24)
Calcium: 8.6 mg/dL — ABNORMAL LOW (ref 8.9–10.3)
Glucose, Bld: 74 mg/dL (ref 65–99)
Potassium: 4 mmol/L (ref 3.5–5.1)
Sodium: 134 mmol/L — ABNORMAL LOW (ref 135–145)
Total Bilirubin: 13.9 mg/dL — ABNORMAL HIGH (ref 0.3–1.2)
Total Protein: 7 g/dL (ref 6.5–8.1)

## 2017-09-25 LAB — GLUCOSE, CAPILLARY: Glucose-Capillary: 119 mg/dL — ABNORMAL HIGH (ref 65–99)

## 2017-09-25 LAB — ACETAMINOPHEN LEVEL

## 2017-09-25 LAB — PROTIME-INR
INR: 0.94
PROTHROMBIN TIME: 12.5 s (ref 11.4–15.2)

## 2017-09-25 LAB — LIPASE, BLOOD: Lipase: 19 U/L (ref 11–51)

## 2017-09-25 MED ORDER — MIRABEGRON ER 25 MG PO TB24
25.0000 mg | ORAL_TABLET | Freq: Every day | ORAL | Status: DC
  Administered 2017-09-26 – 2017-09-28 (×3): 25 mg via ORAL
  Filled 2017-09-25 (×4): qty 1

## 2017-09-25 MED ORDER — SODIUM CHLORIDE 0.9% FLUSH
3.0000 mL | Freq: Two times a day (BID) | INTRAVENOUS | Status: DC
  Administered 2017-09-26 – 2017-09-28 (×6): 3 mL via INTRAVENOUS

## 2017-09-25 MED ORDER — VITAMIN D 1000 UNITS PO TABS
2000.0000 [IU] | ORAL_TABLET | Freq: Every day | ORAL | Status: DC
  Administered 2017-09-26 – 2017-09-28 (×3): 2000 [IU] via ORAL
  Filled 2017-09-25 (×3): qty 2

## 2017-09-25 MED ORDER — ONDANSETRON HCL 4 MG PO TABS
4.0000 mg | ORAL_TABLET | Freq: Four times a day (QID) | ORAL | Status: DC | PRN
  Administered 2017-09-28: 4 mg via ORAL
  Filled 2017-09-25: qty 1

## 2017-09-25 MED ORDER — VITAMIN B-12 1000 MCG PO TABS
1000.0000 ug | ORAL_TABLET | Freq: Every day | ORAL | Status: DC
  Administered 2017-09-26 – 2017-09-28 (×3): 1000 ug via ORAL
  Filled 2017-09-25 (×3): qty 1

## 2017-09-25 MED ORDER — SODIUM CHLORIDE 0.9% FLUSH: 3.0000 mL | INTRAVENOUS | Status: DC | PRN

## 2017-09-25 MED ORDER — CYCLOBENZAPRINE HCL 10 MG PO TABS: 10.0000 mg | ORAL_TABLET | Freq: Three times a day (TID) | ORAL | Status: DC | PRN

## 2017-09-25 MED ORDER — IOPAMIDOL (ISOVUE-300) INJECTION 61%
30.0000 mL | Freq: Once | INTRAVENOUS | Status: AC | PRN
  Administered 2017-09-25: 30 mL via ORAL

## 2017-09-25 MED ORDER — DULOXETINE HCL 20 MG PO CPEP
20.0000 mg | ORAL_CAPSULE | Freq: Every day | ORAL | Status: DC
  Administered 2017-09-26 – 2017-09-28 (×3): 20 mg via ORAL
  Filled 2017-09-25 (×3): qty 1

## 2017-09-25 MED ORDER — ONDANSETRON HCL 4 MG/2ML IJ SOLN
4.0000 mg | Freq: Four times a day (QID) | INTRAMUSCULAR | Status: DC | PRN
  Administered 2017-09-26 – 2017-09-27 (×2): 4 mg via INTRAVENOUS
  Filled 2017-09-25 (×2): qty 2

## 2017-09-25 MED ORDER — INSULIN ASPART 100 UNIT/ML ~~LOC~~ SOLN
0.0000 [IU] | Freq: Three times a day (TID) | SUBCUTANEOUS | Status: DC
  Administered 2017-09-26: 2 [IU] via SUBCUTANEOUS
  Administered 2017-09-26 – 2017-09-27 (×2): 3 [IU] via SUBCUTANEOUS
  Administered 2017-09-27 – 2017-09-28 (×4): 2 [IU] via SUBCUTANEOUS
  Filled 2017-09-25 (×7): qty 1

## 2017-09-25 MED ORDER — HEPARIN SODIUM (PORCINE) 5000 UNIT/ML IJ SOLN
5000.0000 [IU] | Freq: Three times a day (TID) | INTRAMUSCULAR | Status: DC
  Administered 2017-09-26 (×2): 5000 [IU] via SUBCUTANEOUS
  Filled 2017-09-25 (×2): qty 1

## 2017-09-25 MED ORDER — VITAMIN D 1000 UNITS PO TABS: 2000.0000 [IU] | ORAL_TABLET | Freq: Every day | ORAL | Status: DC

## 2017-09-25 MED ORDER — IOPAMIDOL (ISOVUE-300) INJECTION 61%
100.0000 mL | Freq: Once | INTRAVENOUS | Status: AC | PRN
  Administered 2017-09-25: 100 mL via INTRAVENOUS

## 2017-09-25 MED ORDER — ALPRAZOLAM 0.5 MG PO TABS
0.5000 mg | ORAL_TABLET | Freq: Every day | ORAL | Status: DC
  Administered 2017-09-26 – 2017-09-27 (×3): 0.5 mg via ORAL
  Filled 2017-09-25 (×3): qty 1

## 2017-09-25 MED ORDER — SODIUM CHLORIDE 0.9 % IV SOLN
250.0000 mL | INTRAVENOUS | Status: DC | PRN
  Administered 2017-09-27: 08:00:00 via INTRAVENOUS

## 2017-09-25 MED ORDER — SODIUM CHLORIDE 0.9 % IV BOLUS (SEPSIS)
1000.0000 mL | Freq: Once | INTRAVENOUS | Status: AC
  Administered 2017-09-25: 1000 mL via INTRAVENOUS

## 2017-09-25 MED ORDER — PANTOPRAZOLE SODIUM 40 MG PO TBEC
40.0000 mg | DELAYED_RELEASE_TABLET | Freq: Two times a day (BID) | ORAL | Status: DC
  Administered 2017-09-26 – 2017-09-28 (×5): 40 mg via ORAL
  Filled 2017-09-25 (×5): qty 1

## 2017-09-25 MED ORDER — SERTRALINE HCL 50 MG PO TABS
50.0000 mg | ORAL_TABLET | Freq: Every day | ORAL | Status: DC
  Administered 2017-09-26 – 2017-09-28 (×3): 50 mg via ORAL
  Filled 2017-09-25 (×3): qty 1

## 2017-09-25 MED ORDER — DOXAZOSIN MESYLATE 2 MG PO TABS
2.0000 mg | ORAL_TABLET | Freq: Every day | ORAL | Status: DC
  Administered 2017-09-26 – 2017-09-28 (×3): 2 mg via ORAL
  Filled 2017-09-25 (×4): qty 1

## 2017-09-25 MED ORDER — TRAZODONE HCL 50 MG PO TABS
50.0000 mg | ORAL_TABLET | Freq: Every day | ORAL | Status: DC
  Administered 2017-09-26 – 2017-09-27 (×3): 50 mg via ORAL
  Filled 2017-09-25 (×3): qty 1

## 2017-09-25 NOTE — ED Triage Notes (Signed)
Pt was referred from PCP to the ED for elevated liver enzymes. States he called his PCP due to having yellow colored skin , states instructed to come to the office to have labs drawn and called back with elevated labs. Denies any pain or recent illness. Pt is a/ox4 on arrival.

## 2017-09-25 NOTE — ED Provider Notes (Signed)
[facility] Emergency Department Provider Note  ____________________________________________  Time seen: Approximately 8:01 PM  I have reviewed the triage vital signs and the nursing notes.   HISTORY  Chief Complaint Abnormal Lab  Level 5 Caveat: Portions of the History and Physical are unable to be obtained due to patient being a poor historian   HPI Gerald Alvarez. is a 84 y.o. male to the ED by his primary care doctor due to elevated LFTs today. He had called his doctor because he is worried about his skin turning yellow over the last 2 or 3 days. When labs are obtained by PCP, and found to be very abnormal, he was called and told to come to the ED. Patient denies any pain. He has had decreased appetite for the past several days and decreased oral intake. Denies fevers or chills. No significant weight change.     Past Medical History:  Diagnosis Date  . BPH (benign prostatic hyperplasia)   . Diabetes mellitus without complication ([place])   . Incontinence   . Nocturia   . OAB (overactive bladder)   . Prostate cancer [facility])      Patient Active Problem List   Diagnosis Date Noted  . DD (diverticular disease) 03/10/2016  . Calculus of kidney 03/10/2016  . Bilateral tinnitus 03/10/2016  . Cardiomyopathy ([place]) 08/25/2014  . H/O malignant neoplasm of prostate 08/25/2014     Past Surgical History:  Procedure Laterality Date  . CHOLECYSTECTOMY    . HERNIA REPAIR     x 3  . PROSTATECTOMY       Prior to Admission medications   Medication Sig Start Date End Date Taking? Authorizing Provider  acetaminophen (TYLENOL) 325 MG tablet Take by mouth.    [provider]  ALPRAZolam Duanne Moron) 0.5 MG tablet Take by mouth. 08/25/14   [provider]  aspirin 81 MG chewable tablet Chew by mouth.    [provider]  bisoprolol (ZEBETA) 5 MG tablet Take by mouth. 02/25/17   [provider]  Cholecalciferol (VITAMIN D3) 2000  units capsule Take by mouth. 03/05/17   [provider]  cyclobenzaprine (FLEXERIL) 10 MG tablet Take 1 tablet (10 mg total) by mouth 3 (three) times daily as needed for muscle spasms. Patient not taking: Reported on 03/20/2017 01/04/17   Lisa Roca, MD  doxazosin (CARDURA) 2 MG tablet Take by mouth. 12/31/16 12/31/17  [provider]  HYDROcodone-acetaminophen (NORCO/VICODIN) 5-325 MG tablet Take 1 tablet by mouth every 6 (six) hours as needed for moderate pain. Patient not taking: Reported on 03/20/2017 01/04/17   Lisa Roca, MD  metoprolol tartrate (LOPRESSOR) 25 MG tablet Take by mouth. 01/15/15 01/15/16  [provider]  mirabegron ER (MYRBETRIQ) 25 MG TB24 tablet Take 1 tablet (25 mg total) by mouth daily. Patient not taking: Reported on 03/20/2017 03/10/16   Nickie Retort, MD  Multiple Vitamin (MULTI-DAY VITAMINS) TABS Take by mouth.    [provider]  Multiple Vitamin (MULTI-VITAMINS) TABS Take by mouth.    [provider]  pantoprazole (PROTONIX) 40 MG tablet TAKE 1 TABLET TWICE A DAY 02/08/15   [provider]  traZODone (DESYREL) 50 MG tablet Take by mouth. 12/29/16   [provider]  vitamin B-12 (CYANOCOBALAMIN) 1000 MCG tablet Take by mouth.    [provider]     Allergies Sulfa antibiotics   Family History  Problem Relation Age of Onset  . Prostate cancer Brother   . Bladder Cancer  Brother   . Prostate cancer Brother   . Kidney cancer Neg Hx     Social History Social History   Tobacco Use  . Smoking status: Former Smoker    Last attempt to quit: 03/15/1967    Years since quitting: 50.5  . Smokeless tobacco: Former Systems developer    Types: Chew  . Tobacco comment: quit long time  Substance Use Topics  . Alcohol use: No    Alcohol/week: 0.0 oz  . Drug use: No    Review of Systems  Constitutional:   No fever or chills.  Cardiovascular:   No chest pain or syncope. Respiratory:   No dyspnea or  cough. Gastrointestinal:   Negative for abdominal pain, positive occasional vomiting over the last 3 days. No diarrhea or constipation.  Musculoskeletal:   Negative for focal pain or swelling All other systems reviewed and are negative except as documented above in ROS and HPI.  ____________________________________________   PHYSICAL EXAM:  VITAL SIGNS: ED Triage Vitals  Enc Vitals Group     BP 09/25/17 1703 (!) 140/54     Pulse Rate 09/25/17 1703 65     Resp 09/25/17 1821 19     Temp 09/25/17 1703 97.9 F (36.6 C)     Temp Source 09/25/17 1703 Oral     SpO2 09/25/17 1703 96 %     Weight --      Height --      Head Circumference --      Peak Flow --      Pain Score --      Pain Loc --      Pain Edu? --      Excl. in [place]? --     Vital signs reviewed, nursing assessments reviewed.   Constitutional:   Alert and oriented to person and place. not in distress Eyes:   No scleral icterus.  EOMI. No nystagmus. No conjunctival pallor. PERRL. ENT   Head:   Normocephalic and atraumatic.   Nose:   No congestion/rhinnorhea.    Mouth/Throat:   dry mucous membranes, no pharyngeal erythema. No peritonsillar mass.    Neck:   No meningismus. Full ROM. Hematological/Lymphatic/Immunilogical:   No cervical lymphadenopathy. Cardiovascular:   RRR. Symmetric bilateral radial and DP pulses.  No murmurs.  Respiratory:   Normal respiratory effort without tachypnea/retractions. Breath sounds are clear and equal bilaterally. No wheezes/rales/rhonchi. Gastrointestinal:   Soft with epigastric tenderness. Non distended. There is no CVA tenderness.  No rebound, rigidity, or guarding. Genitourinary:   deferred Musculoskeletal:   Normal range of motion in all extremities. No joint effusions.  No lower extremity tenderness.  No edema. Neurologic:   Normal speech and language.  Motor grossly intact. No acute focal neurologic deficits are appreciated.  Skin:    Skin is warm, dry and intact. No  rash noted.  No petechiae, purpura, or bullae.  ____________________________________________    LABS (pertinent positives/negatives) (all labs ordered are listed, but only abnormal results are displayed) Labs Reviewed  CBC WITH DIFFERENTIAL/PLATELET - Abnormal; Notable for the following components:      Result Value   RBC 4.38 (*)    MCV 101.4 (*)    MCH 34.6 (*)    RDW 15.1 (*)    All other components within normal limits  URINALYSIS, COMPLETE (UACMP) WITH MICROSCOPIC - Abnormal; Notable for the following components:   Color, Urine AMBER (*)    APPearance CLEAR (*)    Hgb urine dipstick MODERATE (*)  Bilirubin Urine MODERATE (*)    Protein, ur 30 (*)    Squamous Epithelial / LPF 0-5 (*)    All other components within normal limits  ACETAMINOPHEN LEVEL - Abnormal; Notable for the following components:   Acetaminophen (Tylenol), Serum <10 (*)    All other components within normal limits  COMPREHENSIVE METABOLIC PANEL - Abnormal; Notable for the following components:   Sodium 134 (*)    Chloride 99 (*)    BUN 24 (*)    Creatinine, Ser 0.55 (*)    Calcium 8.6 (*)    Albumin 3.4 (*)    AST 587 (*)    ALT 814 (*)    Alkaline Phosphatase 534 (*)    Total Bilirubin 13.9 (*)    All other components within normal limits  PROTIME-INR  LIPASE, BLOOD  HEPATITIS PANEL, ACUTE   ____________________________________________   EKG    ____________________________________________    RADIOLOGY  Ct Abdomen Pelvis W Contrast  Result Date: 09/25/2017 CLINICAL DATA:  Jaundice.  Biliary obstruction. EXAM: CT ABDOMEN AND PELVIS WITH CONTRAST TECHNIQUE: Multidetector CT imaging of the abdomen and pelvis was performed using the standard protocol following bolus administration of intravenous contrast. CONTRAST:  158mL ISOVUE-300 IOPAMIDOL (ISOVUE-300) INJECTION 61% COMPARISON:  08/05/2017.  And 10/16/2016 FINDINGS: Lower chest: No pleural or pericardial effusion. The lung bases appear  clear. Calcifications within the LAD coronary artery identified. Aortic atherosclerosis noted. Hepatobiliary: Along the dome of the liver within the medial segment of left lobe there is a 1.6 cm ill-defined low-attenuation lesion measuring 1.5 cm, image 16 of series 2. A second lesion is identified within the medial right lobe of liver measuring 2.6 x 1.3 cm. These are not significantly changed when compared with 10/16/16. Status post cholecystectomy. There is moderate to marked intrahepatic biliary dilatation. The common bile duct is increased in caliber measuring 1.2 cm. Pancreas: There is abrupt cut off of the common bile duct at the level of the head of pancreas where there is a focal area of enhancement measuring 1.8 cm, image 36 of series 2 and image 40 of series 5. main duct dilatation measuring up to 5 mm is identified, image 28 of series 2. There is mild atrophy of the body and tail of pancreas. Spleen: Normal in size without focal abnormality. Adrenals/Urinary Tract: The adrenal glands are normal. Normal adrenal glands. Small bilateral kidney lesions are too small to reliably characterize. No hydronephrosis is identified. Urinary bladder appears within normal limits. Stomach/Bowel: No pathologic dilatation of the small or large bowel loops. The appendix is visualized and appears normal. Distal colonic diverticula noted without acute inflammation. Vascular/Lymphatic: Aortic atherosclerosis. No aneurysm. No upper abdominal adenopathy. No pelvic or inguinal adenopathy. Reproductive: Previous prostatectomy. Other: No free fluid or fluid collections. No peritoneal nodularity identified. Musculoskeletal: Probable bone island noted within the left iliac bone. No suspicious bone lesions identified. Age-indeterminate T10 compression deformity is new from 10/16/2016. Unchanged T12 compression fracture. Moderate to advanced degenerative disc disease is noted throughout the lumbar spine. IMPRESSION: 1. Interval  development of moderate to marked intrahepatic biliary dilatation with increase caliber of the common bile duct, new from 08/05/2017. There is an abrupt cut off of the common bile duct at the level of the head of pancreas. A suspicious area of enhancement at the level of the cut off is identified. Findings are suspicious for lesion within head of pancreas. Further evaluation with contrast enhanced pancreas protocol is advised. 2. There are 2 ill-defined lesions within the liver.  These are indeterminate but are unchanged when compared with 10/16/2016. These can may be further characterized with MRI. 3.  Aortic Atherosclerosis (ICD10-I70.0). 4. T10 and T12 compression fractures. Electronically Signed   By: Kerby Moors M.D.   On: 09/25/2017 19:58   US Abdomen Limited Ruq  Result Date: 09/25/2017 CLINICAL DATA:  Hepatitis.  Elevated LFTs. EXAM: ULTRASOUND ABDOMEN LIMITED RIGHT UPPER QUADRANT COMPARISON:  CT abdomen and pelvis dated 08/05/2017. FINDINGS: Gallbladder: Surgically absent. Common bile duct: Diameter: New dilatation up to 1.1 cm, previously 6 mm on prior CT. Liver: No focal lesion identified. New mild intrahepatic biliary dilatation. Within normal limits in parenchymal echogenicity. Portal vein is patent on color Doppler imaging with normal direction of blood flow towards the liver. IMPRESSION: 1. New extra- and intrahepatic biliary dilatation when compared to prior CT. Further evaluation with ERCP or MRCP (if the patient can tolerate breath holds) is recommended. Electronically Signed   By: Titus Dubin M.D.   On: 09/25/2017 17:11    ____________________________________________   PROCEDURES Procedures  ____________________________________________    CLINICAL IMPRESSION / ASSESSMENT AND PLAN / ED COURSE  Pertinent labs & imaging results that were available during my care of the patient were reviewed by me and considered in my medical decision making (see chart for  details).     Clinical Course as of Sep 26 2007  Fri Sep 25, 2017  1736 Pt p/w nausea, otherwise painless jaundice. [place] today suggests biliary obstruction. ?biliary stone vs pancreatic mass. Will obtain CT scan, plan to admit for GI eval.   [PS]    Clinical Course User Index [PS] Carrie Mew, MD     ----------------------------------------- 8:08 PM on 09/25/2017 -----------------------------------------  CT suspicious for mass in the head of the pancreas causing biliary obstruction. We'll discuss with hospitalist for further management.  ____________________________________________   FINAL CLINICAL IMPRESSION(S) / ED DIAGNOSES    Final diagnoses:  Dehydration  Pancreatic mass  Common bile duct obstruction       Portions of this note were generated with dragon dictation software. Dictation errors may occur despite best attempts at proofreading.    Carrie Mew, MD 09/25/17 2009

## 2017-09-25 NOTE — H&P (Signed)
[facility] [place] NAME: Gerald Alvarez    MR#:  [id number]  DATE OF BIRTH:  12-28-1925  DATE OF ADMISSION:  09/25/2017  PRIMARY CARE PHYSICIAN: Rusty Aus, MD   REQUESTING/REFERRING PHYSICIAN: Brenton Grills MD  CHIEF COMPLAINT:   Chief Complaint  Patient presents with  . Abnormal Lab    HISTORY OF PRESENT ILLNESS: Gerald Alvarez  is a 84 y.o. male with a known history of BPH, diabetes type 2, urinary incontinence and history of prostate cancer who is presenting due to painless obstructive jaundice.  Patient states that he has not had much appetite over the past few weeks.  He is lost about 8-10 pounds.  He also has had some abdominal discomfort.  And distention.  Patient started to become jaundiced therefore had blood work drawn and was sent to the ED for evaluation in the ER patient had a CT scan which was abnormal. PAST MEDICAL HISTORY:   Past Medical History:  Diagnosis Date  . BPH (benign prostatic hyperplasia)   . Diabetes mellitus without complication ([place])   . Incontinence   . Nocturia   . OAB (overactive bladder)   . Prostate cancer ([place])     PAST SURGICAL HISTORY:  Past Surgical History:  Procedure Laterality Date  . CHOLECYSTECTOMY    . HERNIA REPAIR     x 3  . PROSTATECTOMY      SOCIAL HISTORY:  Social History   Tobacco Use  . Smoking status: Former Smoker    Last attempt to quit: 03/15/1967    Years since quitting: 50.5  . Smokeless tobacco: Former Systems developer    Types: Chew  . Tobacco comment: quit long time  Substance Use Topics  . Alcohol use: No    Alcohol/week: 0.0 oz    FAMILY HISTORY:  Family History  Problem Relation Age of Onset  . Prostate cancer Brother   . Bladder Cancer Brother   . Prostate cancer Brother   . Kidney cancer Neg Hx     DRUG ALLERGIES:  Allergies  Allergen Reactions  . Sulfa Antibiotics Nausea And Vomiting    REVIEW OF SYSTEMS:   CONSTITUTIONAL: No fever, fatigue or  weakness.  EYES: No blurred or double vision.  EARS, NOSE, AND THROAT: No tinnitus or ear pain.  RESPIRATORY: No cough, shortness of breath, wheezing or hemoptysis.  CARDIOVASCULAR: No chest pain, orthopnea, edema.  GASTROINTESTINAL: No nausea, vomiting, diarrhea or abdominal pain.  GENITOURINARY: No dysuria, hematuria.  ENDOCRINE: No polyuria, nocturia,  HEMATOLOGY: No anemia, easy bruising or bleeding SKIN: Positive jaundice MUSCULOSKELETAL: No joint pain or arthritis.   NEUROLOGIC: No tingling, numbness, weakness.  PSYCHIATRY: No anxiety or depression.   MEDICATIONS AT HOME:  Prior to Admission medications   Medication Sig Start Date End Date Taking? Authorizing Provider  acetaminophen (TYLENOL) 325 MG tablet Take 650 mg by mouth every 4 (four) hours as needed.    Yes [provider]  ALPRAZolam Duanne Moron) 0.5 MG tablet Take 0.5 mg by mouth at bedtime.  08/25/14  Yes [provider]  aspirin 81 MG chewable tablet Chew 81 mg by mouth daily.    Yes [provider]  Cholecalciferol (VITAMIN D3) 2000 units capsule Take 2,000 Units by mouth daily.  03/05/17  Yes [provider]  doxazosin (CARDURA) 2 MG tablet Take 2 mg by mouth daily.  12/31/16 12/31/17 Yes [provider]  DULoxetine (CYMBALTA) 20 MG capsule Take 1 capsule by mouth daily. 09/12/17  Yes [provider]  LEVEMIR FLEXTOUCH 100 UNIT/ML Pen Inject 20 Units into the skin 2 (two) times daily. 6 units-PM 09/11/17  Yes [provider]  Multiple Vitamin (MULTI-VITAMINS) TABS Take 1 tablet by mouth daily.    Yes [provider]  pantoprazole (PROTONIX) 40 MG tablet TAKE 1 TABLET TWICE A DAY 02/08/15  Yes [provider]  sertraline (ZOLOFT) 50 MG tablet Take 1 tablet by mouth daily. 07/14/17  Yes [provider]  traZODone (DESYREL) 50 MG tablet Take 50 mg by mouth at bedtime.  12/29/16  Yes [provider]  vitamin B-12 (CYANOCOBALAMIN) 1000 MCG  tablet Take 1,000 mcg by mouth daily.    Yes [provider]  cyclobenzaprine (FLEXERIL) 10 MG tablet Take 1 tablet (10 mg total) by mouth 3 (three) times daily as needed for muscle spasms. Patient not taking: Reported on 03/20/2017 01/04/17   Lisa Roca, MD  HYDROcodone-acetaminophen (NORCO/VICODIN) 5-325 MG tablet Take 1 tablet by mouth every 6 (six) hours as needed for moderate pain. Patient not taking: Reported on 03/20/2017 01/04/17   Lisa Roca, MD  metoprolol tartrate (LOPRESSOR) 25 MG tablet Take by mouth. 01/15/15 01/15/16  [provider]  mirabegron ER (MYRBETRIQ) 25 MG TB24 tablet Take 1 tablet (25 mg total) by mouth daily. Patient not taking: Reported on 03/20/2017 03/10/16   Nickie Retort, MD      PHYSICAL EXAMINATION:   VITAL SIGNS: Blood pressure (!) 143/76, pulse 73, temperature 97.9 F (36.6 C), temperature source Oral, resp. rate 19, SpO2 95 %.  GENERAL:  84 y.o.-year-old patient lying in the bed with no acute distress.  EYES: Pupils equal, round, reactive to light and accommodation. No scleral icterus. Extraocular muscles intact.  HEENT: Head atraumatic, normocephalic. Oropharynx and nasopharynx clear.  NECK:  Supple, no jugular venous distention. No thyroid enlargement, no tenderness.  LUNGS: Normal breath sounds bilaterally, no wheezing, rales,rhonchi or crepitation. No use of accessory muscles of respiration.  CARDIOVASCULAR: S1, S2 normal. No murmurs, rubs, or gallops.  ABDOMEN: Soft, mildly distended, nondistended. Bowel sounds present. No organomegaly or mass.  EXTREMITIES: No pedal edema, cyanosis, or clubbing.  NEUROLOGIC: Cranial nerves II through XII are intact. Muscle strength 5/5 in all extremities. Sensation intact. Gait not checked.  PSYCHIATRIC: The patient is alert and oriented x 3.  SKIN: Positive jaundice  LABORATORY PANEL:   CBC Recent Labs  Lab 09/25/17 1705  WBC 5.5  HGB 15.2  HCT 44.4  PLT 267  MCV 101.4*  MCH 34.6*   MCHC 34.1  RDW 15.1*  LYMPHSABS 1.5  MONOABS 0.5  EOSABS 0.3  BASOSABS 0.1   ------------------------------------------------------------------------------------------------------------------  Chemistries  Recent Labs  Lab 09/25/17 1810  NA 134*  K 4.0  CL 99*  CO2 25  GLUCOSE 74  BUN 24*  CREATININE 0.55*  CALCIUM 8.6*  AST 587*  ALT 814*  ALKPHOS 534*  BILITOT 13.9*   ------------------------------------------------------------------------------------------------------------------ CrCl cannot be calculated (Unknown ideal weight.). ------------------------------------------------------------------------------------------------------------------ No results for input(s): TSH, T4TOTAL, T3FREE, THYROIDAB in the last 72 hours.  Invalid input(s): FREET3   Coagulation profile Recent Labs  Lab 09/25/17 1705  INR 0.94   ------------------------------------------------------------------------------------------------------------------- No results for input(s): DDIMER in the last 72 hours. -------------------------------------------------------------------------------------------------------------------  Cardiac Enzymes No results for input(s): CKMB, TROPONINI, MYOGLOBIN in the last 168 hours.  Invalid input(s): CK ------------------------------------------------------------------------------------------------------------------ Invalid input(s): POCBNP  ---------------------------------------------------------------------------------------------------------------  Urinalysis    Component Value Date/Time   COLORURINE AMBER (A) 09/25/2017 1705   APPEARANCEUR CLEAR (A) 09/25/2017  [place] 03/15/2015 1517   LABSPEC 1.021 09/25/2017 1705   PHURINE 5.0 09/25/2017 1705   GLUCOSEU NEGATIVE 09/25/2017 1705   HGBUR MODERATE (A) 09/25/2017 1705   BILIRUBINUR MODERATE (A) 09/25/2017 1705   BILIRUBINUR Negative 03/15/2015 [place]  09/25/2017 1705   PROTEINUR 30 (A) 09/25/2017 1705   NITRITE NEGATIVE 09/25/2017 1705   LEUKOCYTESUR NEGATIVE 09/25/2017 1705   LEUKOCYTESUR Negative 03/15/2015 1517     RADIOLOGY: Ct Abdomen Pelvis W Contrast  Result Date: 09/25/2017 CLINICAL DATA:  Jaundice.  Biliary obstruction. EXAM: CT ABDOMEN AND PELVIS WITH CONTRAST TECHNIQUE: Multidetector CT imaging of the abdomen and pelvis was performed using the standard protocol following bolus administration of intravenous contrast. CONTRAST:  162mL ISOVUE-300 IOPAMIDOL (ISOVUE-300) INJECTION 61% COMPARISON:  08/05/2017.  And 10/16/2016 FINDINGS: Lower chest: No pleural or pericardial effusion. The lung bases appear clear. Calcifications within the LAD coronary artery identified. Aortic atherosclerosis noted. Hepatobiliary: Along the dome of the liver within the medial segment of left lobe there is a 1.6 cm ill-defined low-attenuation lesion measuring 1.5 cm, image 16 of series 2. A second lesion is identified within the medial right lobe of liver measuring 2.6 x 1.3 cm. These are not significantly changed when compared with 10/16/16. Status post cholecystectomy. There is moderate to marked intrahepatic biliary dilatation. The common bile duct is increased in caliber measuring 1.2 cm. Pancreas: There is abrupt cut off of the common bile duct at the level of the head of pancreas where there is a focal area of enhancement measuring 1.8 cm, image 36 of series 2 and image 40 of series 5. main duct dilatation measuring up to 5 mm is identified, image 28 of series 2. There is mild atrophy of the body and tail of pancreas. Spleen: Normal in size without focal abnormality. Adrenals/Urinary Tract: The adrenal glands are normal. Normal adrenal glands. Small bilateral kidney lesions are too small to reliably characterize. No hydronephrosis is identified. Urinary bladder appears within normal limits. Stomach/Bowel: No pathologic dilatation of the small or large bowel  loops. The appendix is visualized and appears normal. Distal colonic diverticula noted without acute inflammation. Vascular/Lymphatic: Aortic atherosclerosis. No aneurysm. No upper abdominal adenopathy. No pelvic or inguinal adenopathy. Reproductive: Previous prostatectomy. Other: No free fluid or fluid collections. No peritoneal nodularity identified. Musculoskeletal: Probable bone island noted within the left iliac bone. No suspicious bone lesions identified. Age-indeterminate T10 compression deformity is new from 10/16/2016. Unchanged T12 compression fracture. Moderate to advanced degenerative disc disease is noted throughout the lumbar spine. IMPRESSION: 1. Interval development of moderate to marked intrahepatic biliary dilatation with increase caliber of the common bile duct, new from 08/05/2017. There is an abrupt cut off of the common bile duct at the level of the head of pancreas. A suspicious area of enhancement at the level of the cut off is identified. Findings are suspicious for lesion within head of pancreas. Further evaluation with contrast enhanced pancreas protocol is advised. 2. There are 2 ill-defined lesions within the liver. These are indeterminate but are unchanged when compared with 10/16/2016. These can may be further characterized with MRI. 3.  Aortic Atherosclerosis (ICD10-I70.0). 4. T10 and T12 compression fractures. Electronically Signed   By: Kerby Moors M.D.   On: 09/25/2017 19:58   US Abdomen Limited Ruq  Result Date: 09/25/2017 CLINICAL DATA:  Hepatitis.  Elevated LFTs. EXAM: ULTRASOUND ABDOMEN LIMITED RIGHT UPPER QUADRANT COMPARISON:  CT abdomen and pelvis dated 08/05/2017. FINDINGS: Gallbladder: Surgically absent. Common  bile duct: Diameter: New dilatation up to 1.1 cm, previously 6 mm on prior CT. Liver: No focal lesion identified. New mild intrahepatic biliary dilatation. Within normal limits in parenchymal echogenicity. Portal vein is patent on color Doppler imaging with  normal direction of blood flow towards the liver. IMPRESSION: 1. New extra- and intrahepatic biliary dilatation when compared to prior CT. Further evaluation with ERCP or MRCP (if the patient can tolerate breath holds) is recommended. Electronically Signed   By: Titus Dubin M.D.   On: 09/25/2017 17:11    EKG: Orders placed or performed during the hospital encounter of 01/04/17  . ED EKG  . ED EKG  . ED EKG within 10 minutes  . ED EKG within 10 minutes  . EKG    IMPRESSION AND PLAN: Patient is a 84 year old white male being admitted for jaundice evaluation  1.  Obstructive jaundice concerning for possible pancreatic malignancy Will obtain MRCP GI evaluation Check CA-[id number]  2.  Diabetes type 2 sliding scale insulin hold insulin due to blood glucose being 79  3.  Generalized anxiety disorder continue alprazolam and Cymbalta and Zoloft and trazodone  4.  Miscellaneous SCDs for DVT prophylaxis    All the records are reviewed and case discussed with ED provider. Management plans discussed with the patient, family and they are in agreement.  CODE STATUS: Code Status History    This patient does not have a recorded code status. Please follow your organizational policy for patients in this situation.       TOTAL TIME TAKING CARE OF THIS PATIENT55 minutes.    Dustin Flock M.D on 09/25/2017 at 8:49 PM  Between 7am to 6pm - Pager - [phone]  After 6pm go to [email or web address] - password [email or web address] [facility]  Office  [phone]  CC: Primary care physician; Rusty Aus, MD

## 2017-09-26 ENCOUNTER — Inpatient Hospital Stay: Payer: Medicare Other

## 2017-09-26 DIAGNOSIS — K8689 Other specified diseases of pancreas: Secondary | ICD-10-CM

## 2017-09-26 DIAGNOSIS — K869 Disease of pancreas, unspecified: Secondary | ICD-10-CM

## 2017-09-26 DIAGNOSIS — R17 Unspecified jaundice: Secondary | ICD-10-CM

## 2017-09-26 DIAGNOSIS — K831 Obstruction of bile duct: Secondary | ICD-10-CM

## 2017-09-26 LAB — COMPREHENSIVE METABOLIC PANEL
ALBUMIN: 2.8 g/dL — AB (ref 3.5–5.0)
ALK PHOS: 483 U/L — AB (ref 38–126)
ALT: 622 U/L — AB (ref 17–63)
AST: 474 U/L — ABNORMAL HIGH (ref 15–41)
Anion gap: 10 (ref 5–15)
BILIRUBIN TOTAL: 12.7 mg/dL — AB (ref 0.3–1.2)
BUN: 17 mg/dL (ref 6–20)
CO2: 22 mmol/L (ref 22–32)
CREATININE: 0.46 mg/dL — AB (ref 0.61–1.24)
Calcium: 8.2 mg/dL — ABNORMAL LOW (ref 8.9–10.3)
Chloride: 103 mmol/L (ref 101–111)
GFR calc Af Amer: >60 mL/min (ref >60)
GLUCOSE: 130 mg/dL — AB (ref 65–99)
Potassium: 3.2 mmol/L — ABNORMAL LOW (ref 3.5–5.1)
Sodium: 135 mmol/L (ref 135–145)
TOTAL PROTEIN: 5.9 g/dL — AB (ref 6.5–8.1)

## 2017-09-26 LAB — HEMOGLOBIN A1C
HEMOGLOBIN A1C: 7.2 % — AB (ref 4.8–5.6)
MEAN PLASMA GLUCOSE: 159.94 mg/dL

## 2017-09-26 LAB — CBC
HEMATOCRIT: 39 % — AB (ref 40.0–52.0)
HEMOGLOBIN: 13.7 g/dL (ref 13.0–18.0)
MCH: 35.7 pg — ABNORMAL HIGH (ref 26.0–34.0)
MCHC: 35.1 g/dL (ref 32.0–36.0)
MCV: 101.7 fL — AB (ref 80.0–100.0)
Platelets: 204 10*3/uL (ref 150–440)
RBC: 3.83 MIL/uL — AB (ref 4.40–5.90)
RDW: 15 % — ABNORMAL HIGH (ref 11.5–14.5)
WBC: 4.5 10*3/uL (ref 3.8–10.6)

## 2017-09-26 LAB — GLUCOSE, CAPILLARY
Glucose-Capillary: 116 mg/dL — ABNORMAL HIGH (ref 65–99)
Glucose-Capillary: 246 mg/dL — ABNORMAL HIGH (ref 65–99)
Glucose-Capillary: 186 mg/dL — ABNORMAL HIGH (ref 65–99)
Glucose-Capillary: 165 mg/dL — ABNORMAL HIGH (ref 65–99)

## 2017-09-26 LAB — HEPATITIS PANEL, ACUTE
HEP A IGM: NEGATIVE
Hep B C IgM: NEGATIVE
Hepatitis B Surface Ag: NEGATIVE

## 2017-09-26 MED ORDER — GADOBENATE DIMEGLUMINE 529 MG/ML IV SOLN
15.0000 mL | Freq: Once | INTRAVENOUS | Status: AC | PRN
  Administered 2017-09-26: 03:00:00 15 mL via INTRAVENOUS

## 2017-09-26 NOTE — Plan of Care (Signed)
  Progressing Spiritual Needs Ability to function at adequate level 09/26/2017 1308 - Progressing by Cherylann Parr, RN Education: Knowledge of General Education information will improve 09/26/2017 1308 - Progressing by Cherylann Parr, RN Health Behavior/Discharge Planning: Ability to manage health-related needs will improve 09/26/2017 1308 - Progressing by Cherylann Parr, RN Clinical Measurements: Ability to maintain clinical measurements within normal limits will improve 09/26/2017 1308 - Progressing by Cherylann Parr, RN Will remain free from infection 09/26/2017 1308 - Progressing by Cherylann Parr, RN Diagnostic test results will improve 09/26/2017 1308 - Progressing by Cherylann Parr, RN Respiratory complications will improve 09/26/2017 1308 - Progressing by Cherylann Parr, RN Cardiovascular complication will be avoided 09/26/2017 1308 - Progressing by Cherylann Parr, RN Activity: Risk for activity intolerance will decrease 09/26/2017 1308 - Progressing by Cherylann Parr, RN Nutrition: Adequate nutrition will be maintained 09/26/2017 1308 - Progressing by Cherylann Parr, RN Coping: Level of anxiety will decrease 09/26/2017 1308 - Progressing by Cherylann Parr, RN Elimination: Will not experience complications related to bowel motility 09/26/2017 1308 - Progressing by Cherylann Parr, RN Will not experience complications related to urinary retention 09/26/2017 1308 - Progressing by Cherylann Parr, RN Safety: Ability to remain free from injury will improve 09/26/2017 1308 - Progressing by Cherylann Parr, RN Skin Integrity: Risk for impaired skin integrity will decrease 09/26/2017 1308 - Progressing by Cherylann Parr, RN

## 2017-09-26 NOTE — Anesthesia Preprocedure Evaluation (Deleted)
Anesthesia Evaluation    Airway        Dental   Pulmonary former smoker,           Cardiovascular      Neuro/Psych  Neuromuscular disease    GI/Hepatic   Endo/Other  diabetes  Renal/GU Renal disease     Musculoskeletal   Abdominal   Peds  Hematology   Anesthesia Other Findings Past Medical History: No date: BPH (benign prostatic hyperplasia) No date: Diabetes mellitus without complication ([place]) No date: Incontinence No date: Nocturia No date: OAB (overactive bladder) No date: Prostate cancer [facility])    Reproductive/Obstetrics                             Anesthesia Physical Anesthesia Plan Anesthesia Quick Evaluation

## 2017-09-26 NOTE — Anesthesia Preprocedure Evaluation (Addendum)
Anesthesia Evaluation  Patient identified by MRN, date of birth, ID band Patient awake    Reviewed: Allergy & Precautions, H&P , NPO status , Patient's Chart, lab work & pertinent test results, reviewed documented beta blocker date and time   History of Anesthesia Complications Negative for: history of anesthetic complications  Airway Mallampati: II  TM Distance: >3 FB Neck ROM: full    Dental  (+) Caps, Dental Advidsory Given, Teeth Intact   Pulmonary neg pulmonary ROS, former smoker,           Cardiovascular Exercise Tolerance: Good hypertension, (-) angina(-) CAD, (-) Past MI, (-) Cardiac Stents and (-) CABG (-) dysrhythmias + Valvular Problems/Murmurs      Neuro/Psych neg Seizures  Neuromuscular disease negative psych ROS   GI/Hepatic Neg liver ROS, GERD  ,  Endo/Other  diabetes  Renal/GU Renal disease  negative genitourinary   Musculoskeletal   Abdominal   Peds  Hematology negative hematology ROS (+)   Anesthesia Other Findings Past Medical History: No date: BPH (benign prostatic hyperplasia) No date: Diabetes mellitus without complication ([facility]) No date: Incontinence No date: Nocturia No date: OAB (overactive bladder) No date: Prostate cancer ([facility])  BMI    Body Mass Index:  26.75 kg/m    Past Surgical History: No date: CHOLECYSTECTOMY No date: HERNIA REPAIR     Comment:  x 3 No date: PROSTATECTOMY   Reproductive/Obstetrics negative OB ROS                            Anesthesia Physical Anesthesia Plan  ASA: III  Anesthesia Plan: General   Post-op Pain Management:    Induction: Intravenous  PONV Risk Score and Plan: 2 and Propofol infusion  Airway Management Planned: Nasal Cannula  Additional Equipment:   Intra-op Plan:   Post-operative Plan:   Informed Consent: I have reviewed the patients History and Physical, chart, labs and discussed the procedure  including the risks, benefits and alternatives for the proposed anesthesia with the patient or authorized representative who has indicated his/her understanding and acceptance.   Dental Advisory Given  Plan Discussed with: Anesthesiologist, CRNA and Surgeon  Anesthesia Plan Comments:         Anesthesia Quick Evaluation

## 2017-09-26 NOTE — Progress Notes (Signed)
Patient ID: Gerald Angst., male   DOB: [birth year], 84 y.o.   MRN: [id number]  [facility] PROGRESS NOTE  Gerald Angst. VPX:[id number] DOB: 08-Feb-1926 DOA: 09/25/2017 PCP: Rusty Aus, MD  HPI/Subjective: Patient feels okay.  No abdominal pain.  Had some decreased appetite at home.  Came in with jaundice  Objective: Vitals:   09/26/17 0422 09/26/17 1525  BP: (!) 107/51 (!) 133/55  Pulse: 66 72  Resp: 16   Temp: 97.6 F (36.4 C)   SpO2: 95% 99%    Intake/Output Summary (Last 24 hours) at 09/26/2017 1606 Last data filed at 09/26/2017 1300 Gross per 24 hour  Intake 1603 ml  Output -  Net 1603 ml   Filed Weights   09/25/17 2252  Weight: 66.3 kg (146 lb 4 oz)    ROS: Review of Systems  Constitutional: Negative for chills and fever.  Eyes: Negative for blurred vision.  Respiratory: Negative for cough and shortness of breath.   Cardiovascular: Negative for chest pain.  Gastrointestinal: Negative for abdominal pain, constipation, diarrhea, nausea and vomiting.  Genitourinary: Negative for dysuria.  Musculoskeletal: Negative for joint pain.  Neurological: Negative for dizziness and headaches.   Exam: Physical Exam  Constitutional: He is oriented to person, place, and time.  HENT:  Nose: No mucosal edema.  Mouth/Throat: No oropharyngeal exudate or posterior oropharyngeal edema.  Eyes: EOM and lids are normal. Pupils are equal, round, and reactive to light.  Conjunctiva icteric  Neck: No JVD present. Carotid bruit is not present. No edema present. No thyroid mass and no thyromegaly present.  Cardiovascular: S1 normal and S2 normal. Exam reveals no gallop.  No murmur heard. Pulses:      Dorsalis pedis pulses are 2+ on the right side, and 2+ on the left side.  Respiratory: No respiratory distress. He has no wheezes. He has no rhonchi. He has no rales.  GI: Soft. Bowel sounds are normal. There is no tenderness.  Musculoskeletal:       Right ankle: He exhibits  no swelling.       Left ankle: He exhibits no swelling.  Lymphadenopathy:    He has no cervical adenopathy.  Neurological: He is alert and oriented to person, place, and time. No cranial nerve deficit.  Skin: Skin is warm. Nails show no clubbing.  Jaundice  Psychiatric: He has a normal mood and affect.      Data Reviewed: Basic Metabolic Panel: Recent Labs  Lab 09/25/17 1810 09/26/17 0546  NA 134* 135  K 4.0 3.2*  CL 99* 103  CO2 25 22  GLUCOSE 74 130*  BUN 24* 17  CREATININE 0.55* 0.46*  CALCIUM 8.6* 8.2*   Liver Function Tests: Recent Labs  Lab 09/25/17 1810 09/26/17 0546  AST 587* 474*  ALT 814* 622*  ALKPHOS 534* 483*  BILITOT 13.9* 12.7*  PROT 7.0 5.9*  ALBUMIN 3.4* 2.8*   Recent Labs  Lab 09/25/17 1810  LIPASE 19   CBC: Recent Labs  Lab 09/25/17 1705 09/26/17 0546  WBC 5.5 4.5  NEUTROABS 3.2  --   HGB 15.2 13.7  HCT 44.4 39.0*  MCV 101.4* 101.7*  PLT 267 204    CBG: Recent Labs  Lab 09/25/17 2259 09/26/17 0736 09/26/17 1148  GLUCAP 119* 116* 246*     Studies: Ct Abdomen Pelvis W Contrast  Result Date: 09/25/2017 CLINICAL DATA:  Jaundice.  Biliary obstruction. EXAM: CT ABDOMEN AND PELVIS WITH CONTRAST TECHNIQUE: Multidetector CT imaging of the abdomen  and pelvis was performed using the standard protocol following bolus administration of intravenous contrast. CONTRAST:  11mL ISOVUE-300 IOPAMIDOL (ISOVUE-300) INJECTION 61% COMPARISON:  08/05/2017.  And 10/16/2016 FINDINGS: Lower chest: No pleural or pericardial effusion. The lung bases appear clear. Calcifications within the LAD coronary artery identified. Aortic atherosclerosis noted. Hepatobiliary: Along the dome of the liver within the medial segment of left lobe there is a 1.6 cm ill-defined low-attenuation lesion measuring 1.5 cm, image 16 of series 2. A second lesion is identified within the medial right lobe of liver measuring 2.6 x 1.3 cm. These are not significantly changed when  compared with 10/16/16. Status post cholecystectomy. There is moderate to marked intrahepatic biliary dilatation. The common bile duct is increased in caliber measuring 1.2 cm. Pancreas: There is abrupt cut off of the common bile duct at the level of the head of pancreas where there is a focal area of enhancement measuring 1.8 cm, image 36 of series 2 and image 40 of series 5. main duct dilatation measuring up to 5 mm is identified, image 28 of series 2. There is mild atrophy of the body and tail of pancreas. Spleen: Normal in size without focal abnormality. Adrenals/Urinary Tract: The adrenal glands are normal. Normal adrenal glands. Small bilateral kidney lesions are too small to reliably characterize. No hydronephrosis is identified. Urinary bladder appears within normal limits. Stomach/Bowel: No pathologic dilatation of the small or large bowel loops. The appendix is visualized and appears normal. Distal colonic diverticula noted without acute inflammation. Vascular/Lymphatic: Aortic atherosclerosis. No aneurysm. No upper abdominal adenopathy. No pelvic or inguinal adenopathy. Reproductive: Previous prostatectomy. Other: No free fluid or fluid collections. No peritoneal nodularity identified. Musculoskeletal: Probable bone island noted within the left iliac bone. No suspicious bone lesions identified. Age-indeterminate T10 compression deformity is new from 10/16/2016. Unchanged T12 compression fracture. Moderate to advanced degenerative disc disease is noted throughout the lumbar spine. IMPRESSION: 1. Interval development of moderate to marked intrahepatic biliary dilatation with increase caliber of the common bile duct, new from 08/05/2017. There is an abrupt cut off of the common bile duct at the level of the head of pancreas. A suspicious area of enhancement at the level of the cut off is identified. Findings are suspicious for lesion within head of pancreas. Further evaluation with contrast enhanced pancreas  protocol is advised. 2. There are 2 ill-defined lesions within the liver. These are indeterminate but are unchanged when compared with 10/16/2016. These can may be further characterized with MRI. 3.  Aortic Atherosclerosis (ICD10-I70.0). 4. T10 and T12 compression fractures. Electronically Signed   By: Kerby Moors M.D.   On: 09/25/2017 19:58   Mr 3d Recon At Scanner  Result Date: 09/26/2017 CLINICAL DATA:  84 year old male with history of painless obstructive jaundice. Lack of appetite. 8-10 pound weight loss over the past several weeks. Abdominal discomfort. EXAM: MRI ABDOMEN WITHOUT AND WITH CONTRAST (INCLUDING MRCP) TECHNIQUE: Multiplanar multisequence MR imaging of the abdomen was performed both before and after the administration of intravenous contrast. Heavily T2-weighted images of the biliary and pancreatic ducts were obtained, and three-dimensional MRCP images were rendered by post processing. CONTRAST:  47mL MULTIHANCE GADOBENATE DIMEGLUMINE 529 MG/ML IV SOLN COMPARISON:  No priors. FINDINGS: Lower chest: Unremarkable. Hepatobiliary: 1.4 x 0.9 cm T1 hypointense, T2 hyperintense, nonenhancing lesion in the central aspect of segment 8 of the liver is compatible with a small cyst. Additionally, in the central aspect of segments 7 and 8 there is a 2.5 x 1.5 cm T1 hypointense,  T2 hyperintense, nonenhancing lesion (axial image 44 of series 20), compatible with a small cyst. No other suspicious hepatic lesions are noted. Status post cholecystectomy. MRCP images demonstrate moderate intra and extrahepatic biliary ductal dilatation. Notably, the common bile duct abruptly terminates as it approaches the pancreatic head, with an appearance suggestive of compression from an extrinsic source. No intraductal lesion identified to suggest retained choledocholithiasis. Pancreas: There is a multilocular mixed cystic and solid lesion in the head of the pancreas which is poorly defined on today's examination, but  coincides with the area of extrinsic mass effect upon the common bile duct, highly concerning for primary pancreatic neoplasm. The area of apparent involvement is estimated to measure approximately 2.6 x 3.6 cm (axial image 23 of series 14), as demonstrated by multiple small T1 hypointense, T2 hyperintense, nonenhancing cysts, as well as some amorphous soft tissue stroma between the cysts which demonstrates heterogeneous low level enhancement. This is also associated with diffuse pancreatic ductal dilatation measuring up to 6 mm in the body of the pancreas. No peripancreatic fluid or inflammatory changes. Spleen:  Unremarkable. Adrenals/Urinary Tract: Multiple T1 hypointense, T2 hyperintense, nonenhancing lesions are noted in both kidneys, compatible with multiple simple cysts, all of which are subcentimeter in size. No hydroureteronephrosis in the visualized portions of the abdomen. Bilateral adrenal glands are normal in appearance. Stomach/Bowel: Visualized portions are unremarkable. Vascular/Lymphatic: No aneurysm identified in the visualized abdominal vasculature. No lymphadenopathy noted in the visualized portions of the abdomen. Other:  No significant volume portions of the peritoneal cavity. Musculoskeletal: No aggressive appearing osseous lesions are noted in the visualized portions of the skeleton. IMPRESSION: 1. 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 Further evaluation with endoscopy for endoscopic ultrasound and biopsy is in the near future to establish a tissue diagnosis. 2. 2 small lesions in the liver are compatible with cysts. Electronically Signed   By: Vinnie Langton M.D.   On: 09/26/2017 09:36   Mr Abdomen Mrcp Moise Boring Contast  Result Date: 09/26/2017 CLINICAL DATA:  84 year old male with history of painless obstructive jaundice. Lack of appetite. 8-10  pound weight loss over the past several weeks. Abdominal discomfort. EXAM: MRI ABDOMEN WITHOUT AND WITH CONTRAST (INCLUDING MRCP) TECHNIQUE: Multiplanar multisequence MR imaging of the abdomen was performed both before and after the administration of intravenous contrast. Heavily T2-weighted images of the biliary and pancreatic ducts were obtained, and three-dimensional MRCP images were rendered by post processing. CONTRAST:  47mL MULTIHANCE GADOBENATE DIMEGLUMINE 529 MG/ML IV SOLN COMPARISON:  No priors. FINDINGS: Lower chest: Unremarkable. Hepatobiliary: 1.4 x 0.9 cm T1 hypointense, T2 hyperintense, nonenhancing lesion in the central aspect of segment 8 of the liver is compatible with a small cyst. Additionally, in the central aspect of segments 7 and 8 there is a 2.5 x 1.5 cm T1 hypointense, T2 hyperintense, nonenhancing lesion (axial image 44 of series 20), compatible with a small cyst. No other suspicious hepatic lesions are noted. Status post cholecystectomy. MRCP images demonstrate moderate intra and extrahepatic biliary ductal dilatation. Notably, the common bile duct abruptly terminates as it approaches the pancreatic head, with an appearance suggestive of compression from an extrinsic source. No intraductal lesion identified to suggest retained choledocholithiasis. Pancreas: There is a multilocular mixed cystic and solid lesion in the head of the pancreas which is poorly defined on today's examination, but coincides with the area  of extrinsic mass effect upon the common bile duct, highly concerning for primary pancreatic neoplasm. The area of apparent involvement is estimated to measure approximately 2.6 x 3.6 cm (axial image 23 of series 14), as demonstrated by multiple small T1 hypointense, T2 hyperintense, nonenhancing cysts, as well as some amorphous soft tissue stroma between the cysts which demonstrates heterogeneous low level enhancement. This is also associated with diffuse pancreatic ductal  dilatation measuring up to 6 mm in the body of the pancreas. No peripancreatic fluid or inflammatory changes. Spleen:  Unremarkable. Adrenals/Urinary Tract: Multiple T1 hypointense, T2 hyperintense, nonenhancing lesions are noted in both kidneys, compatible with multiple simple cysts, all of which are subcentimeter in size. No hydroureteronephrosis in the visualized portions of the abdomen. Bilateral adrenal glands are normal in appearance. Stomach/Bowel: Visualized portions are unremarkable. Vascular/Lymphatic: No aneurysm identified in the visualized abdominal vasculature. No lymphadenopathy noted in the visualized portions of the abdomen. Other:  No significant volume portions of the peritoneal cavity. Musculoskeletal: No aggressive appearing osseous lesions are noted in the visualized portions of the skeleton. IMPRESSION: 1. 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 Further evaluation with endoscopy for endoscopic ultrasound and biopsy is in the near future to establish a tissue diagnosis. 2. 2 small lesions in the liver are compatible with cysts. Electronically Signed   By: Vinnie Langton M.D.   On: 09/26/2017 09:36   US Abdomen Limited Ruq  Result Date: 09/25/2017 CLINICAL DATA:  Hepatitis.  Elevated LFTs. EXAM: ULTRASOUND ABDOMEN LIMITED RIGHT UPPER QUADRANT COMPARISON:  CT abdomen and pelvis dated 08/05/2017. FINDINGS: Gallbladder: Surgically absent. Common bile duct: Diameter: New dilatation up to 1.1 cm, previously 6 mm on prior CT. Liver: No focal lesion identified. New mild intrahepatic biliary dilatation. Within normal limits in parenchymal echogenicity. Portal vein is patent on color Doppler imaging with normal direction of blood flow towards the liver. IMPRESSION: 1. New extra- and intrahepatic biliary dilatation when compared to prior CT. Further  evaluation with ERCP or MRCP (if the patient can tolerate breath holds) is recommended. Electronically Signed   By: Titus Dubin M.D.   On: 09/25/2017 17:11    Scheduled Meds: . ALPRAZolam  0.5 mg Oral QHS  . cholecalciferol  2,000 Units Oral Daily  . doxazosin  2 mg Oral Daily  . DULoxetine  20 mg Oral Daily  . insulin aspart  0-9 Units Subcutaneous TID WC  . mirabegron ER  25 mg Oral Daily  . pantoprazole  40 mg Oral BID  . sertraline  50 mg Oral Daily  . sodium chloride flush  3 mL Intravenous Q12H  . traZODone  50 mg Oral QHS  . vitamin B-12  1,000 mcg Oral Daily   Continuous Infusions: . sodium chloride      Assessment/Plan:  1. Jaundice with obstructing pancreatic mass.  Patient ate breakfast today so unable to do ERCP today.  Hopefully will be able to get tomorrow. 2. History of prostate cancer 3. Anxiety on Xanax 4. Type 2 diabetes on sliding scale 5. Depression on Zoloft and trazodone 6. BPH on doxazosin  Code Status:     Code Status Orders  (From admission, onward)        Start     Ordered   09/25/17 2317  Full code  Continuous     09/25/17 2317    Code Status  History    Date Active Date Inactive Code Status Order ID Comments User Context   This patient has a current code status but no historical code status.    Advance Directive Documentation     Most Recent Value  Type of Advance Directive  Healthcare Power of Attorney, Living will  Pre-existing out of facility DNR order (yellow form or pink MOST form)  No data  "MOST" Form in Place?  No data     Family Communication: Family at bedside Disposition Plan: Hopefully home in a day  after ERCP   Consultants:  Gastroenterology  Time spent: 35 minutes  [place]

## 2017-09-26 NOTE — Plan of Care (Signed)
VSS, free of falls since admission to unit.  Oriented to unit, call bell, bed alarm for FPP.  MRI completed during shift.  Denies pain, no needs overnight.  Bed in low position, call bell within reach.  WCTM.

## 2017-09-26 NOTE — Evaluation (Addendum)
Clinical/Bedside Swallow Evaluation Patient Details  Name: Gerald Alvarez. MRN: [id number] Date of Birth: August 28, 1926  Today's Date: 09/26/2017 Time: SLP Start Time (ACUTE ONLY): 0830 SLP Stop Time (ACUTE ONLY): 0940 SLP Time Calculation (min) (ACUTE ONLY): 70 min  Past Medical History:  Past Medical History:  Diagnosis Date  . BPH (benign prostatic hyperplasia)   . Diabetes mellitus without complication ([place])   . Incontinence   . Nocturia   . OAB (overactive bladder)   . Prostate cancer [facility])    Past Surgical History:  Past Surgical History:  Procedure Laterality Date  . CHOLECYSTECTOMY    . HERNIA REPAIR     x 3  . PROSTATECTOMY     HPI: Per Admitting History and Physical:  Gerald Alvarez  is a 84 y.o. male with a known history of BPH, diabetes type 2, urinary incontinence and history of prostate cancer who is presenting due to painless obstructive jaundice.  Patient states that he has not had much appetite over the past few weeks.  He is lost about 8-10 pounds.  He also has had some abdominal discomfort.  And distention.  Patient started to become jaundiced therefore had blood work drawn and was sent to the ED for evaluation in the ER patient had a CT scan which was        Assessment / Plan / Recommendation Clinical Impression  Pt presents with inconsistent s/s of aspiration with thin liquids. Pt tolerated all other consistencies well. Oral mech exam WFL. Given thin liquid by cup, Pt often presented with an immediate cough. Pt tolerated thin by spoon well. When asked to hold the thin liquid bolus in his mouth prior to swallowing, no s/s of aspiration were noted. Laryngeal elevation appeared adequate, vocal quality remained clear. Pt also tolerated single sips of water by straw while intentionally holding the bolus for 1 second prior to swallowing. Adequate oral transit with no oral residue after the swallow with solids. Rec continue with current heart healthy diet. Implement  swallowing stragy of intentioanl bolus hold with thin liquids before the swallow. Pt and family were instructed on strategy. If Pt continues to show difficulty with thin by straw or cup, ask Pt to take liquids by spoon. All meds to be given in applesauce or something thick. SLP Visit Diagnosis: Dysphagia, oropharyngeal phase (R13.12)    Aspiration Risk  Mild aspiration risk    Diet Recommendation Regular   Medication Administration: Whole meds with puree Supervision: Intermittent supervision to cue for compensatory strategies Compensations: Slow rate;Small sips/bites(Hold small sip of liquid in mouth before swallowing)    Other  Recommendations Oral Care Recommendations: Patient independent with oral care   Follow up Recommendations        Frequency and Duration min 3x week  1 week       Prognosis Prognosis for Safe Diet Advancement: Good      Swallow Study   General Type of Study: Bedside Swallow Evaluation Temperature Spikes Noted: No Respiratory Status: Room air History of Recent Intubation: No Behavior/Cognition: Alert;Cooperative;Pleasant mood Oral Cavity Assessment: Within Functional Limits Oral Care Completed by SLP: Yes Oral Cavity - Dentition: Adequate natural dentition Self-Feeding Abilities: Able to feed self Patient Positioning: Upright in bed Baseline Vocal Quality: Normal Volitional Cough: Strong Volitional Swallow: Able to elicit    Oral/Motor/Sensory Function     Ice Chips     Thin Liquid Thin Liquid: Impaired Presentation: Cup;Self Fed;Spoon;Straw Pharyngeal  Phase Impairments: Suspected delayed Swallow;Cough - Immediate Other Comments: (  Pt tolerated better when asked to hold in mouth)    Nectar Thick     Honey Thick     Puree Puree: Within functional limits   Solid   GO   Solid: Within functional limits        Lucila Maine 09/26/2017,9:41 AM

## 2017-09-26 NOTE — Consult Note (Addendum)
Gerald Lame, MD [facility]  [address], [place], [place] [address] Phone: [phone] Fax : [phone]  Consultation  Referring Provider:     Dr. Posey Pronto Primary Care Physician:  Rusty Aus, MD Primary Gastroenterologist:  Dr. Vira Agar         Reason for Consultation:     Obstructive jaundice  Date of Admission:  09/25/2017 Date of Consultation:  09/26/2017         HPI:   Gerald Alvarez. is a 84 y.o. male who was admitted with a few days of worsening jaundice. The patient also has lost approximate 10 pounds recently. The patient has not been needing and states that his abdomen has been bloated. The patient reports that a few years ago he was recommended to have a colonoscopy but his insurance companies stated that they would not cover 2 he did not have it done. The patient has been suffering from abdominal discomfort and bloating for some time. The patient had a CT scan of the abdomen back in November that showed a stable low attenuation lesion in the liver with diverticulosis and a chronic T12 compression fraction. The patient then had a repeat CT scan yesterday that showed interval development of a markedly dilated biliary system there was an abrupt cut off in the bile duct at the level of the head of the pancreas suspicious for a possible pancreatic head lesion. The lesions in the liver were unchanged. I'm now being asked to see the patient for obstructive jaundice.  Past Medical History:  Diagnosis Date  . BPH (benign prostatic hyperplasia)   . Diabetes mellitus without complication ([place])   . Incontinence   . Nocturia   . OAB (overactive bladder)   . Prostate cancer [facility])     Past Surgical History:  Procedure Laterality Date  . CHOLECYSTECTOMY    . HERNIA REPAIR     x 3  . PROSTATECTOMY      Prior to Admission medications   Medication Sig Start Date End Date Taking? Authorizing Provider  acetaminophen (TYLENOL) 325 MG tablet Take 650 mg by mouth every 4 (four)  hours as needed.    Yes [provider]  ALPRAZolam Duanne Moron) 0.5 MG tablet Take 0.5 mg by mouth at bedtime.  08/25/14  Yes [provider]  aspirin 81 MG chewable tablet Chew 81 mg by mouth daily.    Yes [provider]  Cholecalciferol (VITAMIN D3) 2000 units capsule Take 2,000 Units by mouth daily.  03/05/17  Yes [provider]  doxazosin (CARDURA) 2 MG tablet Take 2 mg by mouth daily.  12/31/16 12/31/17 Yes [provider]  DULoxetine (CYMBALTA) 20 MG capsule Take 1 capsule by mouth daily. 09/12/17  Yes [provider]  LEVEMIR FLEXTOUCH 100 UNIT/ML Pen Inject 20 Units into the skin 2 (two) times daily. 6 units-PM 09/11/17  Yes [provider]  Multiple Vitamin (MULTI-VITAMINS) TABS Take 1 tablet by mouth daily.    Yes [provider]  pantoprazole (PROTONIX) 40 MG tablet TAKE 1 TABLET TWICE A DAY 02/08/15  Yes [provider]  sertraline (ZOLOFT) 50 MG tablet Take 1 tablet by mouth daily. 07/14/17  Yes [provider]  traZODone (DESYREL) 50 MG tablet Take 50 mg by mouth at bedtime.  12/29/16  Yes [provider]  vitamin B-12 (CYANOCOBALAMIN) 1000 MCG tablet Take 1,000 mcg by mouth daily.    Yes [provider]  cyclobenzaprine (FLEXERIL) 10 MG tablet Take 1  tablet (10 mg total) by mouth 3 (three) times daily as needed for muscle spasms. Patient not taking: Reported on 03/20/2017 01/04/17   Lisa Roca, MD  HYDROcodone-acetaminophen (NORCO/VICODIN) 5-325 MG tablet Take 1 tablet by mouth every 6 (six) hours as needed for moderate pain. Patient not taking: Reported on 03/20/2017 01/04/17   Lisa Roca, MD  metoprolol tartrate (LOPRESSOR) 25 MG tablet Take by mouth. 01/15/15 01/15/16  [provider]  mirabegron ER (MYRBETRIQ) 25 MG TB24 tablet Take 1 tablet (25 mg total) by mouth daily. Patient not taking: Reported on 03/20/2017 03/10/16   Nickie Retort, MD    Family History  Problem  Relation Age of Onset  . Prostate cancer Brother   . Bladder Cancer Brother   . Prostate cancer Brother   . Kidney cancer Neg Hx      Social History   Tobacco Use  . Smoking status: Former Smoker    Last attempt to quit: 03/15/1967    Years since quitting: 50.5  . Smokeless tobacco: Former Systems developer    Types: Chew  . Tobacco comment: quit long time  Substance Use Topics  . Alcohol use: No    Alcohol/week: 0.0 oz  . Drug use: No    Allergies as of 09/25/2017 - Review Complete 09/25/2017  Allergen Reaction Noted  . Sulfa antibiotics Nausea And Vomiting 03/15/2015    Review of Systems:    All systems reviewed and negative except where noted in HPI.   Physical Exam:  Vital signs in last 24 hours: Temp:  [97.5 F (36.4 C)-97.9 F (36.6 C)] 97.6 F (36.4 C) (01/19 0422) Pulse Rate:  [63-86] 66 (01/19 0422) Resp:  [13-33] 16 (01/19 0422) BP: (107-145)/(51-82) 107/51 (01/19 0422) SpO2:  [92 %-100 %] 95 % (01/19 0422) Weight:  [146 lb 4 oz (66.3 kg)] 146 lb 4 oz (66.3 kg) (01/18 2252) Last BM Date: 09/26/17 General:   Pleasant, cooperative in NAD Head:  Normocephalic and atraumatic. Eyes:   Positive icterus.   Conjunctiva yellow. PERRLA. Ears:  Normal auditory acuity. Neck:  Supple; no masses or thyroidomegaly Lungs: Respirations even and unlabored. Lungs clear to auscultation bilaterally.   No wheezes, crackles, or rhonchi.  Heart:  Regular rate and rhythm;  positive murmur, positive click, rubs or gallops Abdomen:  Soft, nondistended, nontender. Normal bowel sounds. No appreciable masses or hepatomegaly.  No rebound or guarding.  Rectal:  Not performed. Msk:  Symmetrical without gross deformities.  Strength  Extremities:  Without edema, cyanosis or clubbing. Neurologic:  Alert and oriented x3;  grossly normal neurologically. Hard of hearing Skin:  Intact without significant lesions or rashes. Positive jaundice Cervical Nodes:  No significant cervical adenopathy. Psych:   Alert and cooperative. Normal affect.  LAB RESULTS: Recent Labs    09/25/17 1705 09/26/17 0546  WBC 5.5 4.5  HGB 15.2 13.7  HCT 44.4 39.0*  PLT 267 204   BMET Recent Labs    09/25/17 1810 09/26/17 0546  NA 134* 135  K 4.0 3.2*  CL 99* 103  CO2 25 22  GLUCOSE 74 130*  BUN 24* 17  CREATININE 0.55* 0.46*  CALCIUM 8.6* 8.2*   LFT Recent Labs    09/26/17 0546  PROT 5.9*  ALBUMIN 2.8*  AST 474*  ALT 622*  ALKPHOS 483*  BILITOT 12.7*   PT/INR Recent Labs    09/25/17 1705  LABPROT 12.5  INR 0.94    STUDIES: Ct Abdomen Pelvis W Contrast  Result Date: 09/25/2017 CLINICAL DATA:  Jaundice.  Biliary obstruction. EXAM: CT ABDOMEN AND PELVIS WITH CONTRAST TECHNIQUE: Multidetector CT imaging of the abdomen and pelvis was performed using the standard protocol following bolus administration of intravenous contrast. CONTRAST:  174mL ISOVUE-300 IOPAMIDOL (ISOVUE-300) INJECTION 61% COMPARISON:  08/05/2017.  And 10/16/2016 FINDINGS: Lower chest: No pleural or pericardial effusion. The lung bases appear clear. Calcifications within the LAD coronary artery identified. Aortic atherosclerosis noted. Hepatobiliary: Along the dome of the liver within the medial segment of left lobe there is a 1.6 cm ill-defined low-attenuation lesion measuring 1.5 cm, image 16 of series 2. A second lesion is identified within the medial right lobe of liver measuring 2.6 x 1.3 cm. These are not significantly changed when compared with 10/16/16. Status post cholecystectomy. There is moderate to marked intrahepatic biliary dilatation. The common bile duct is increased in caliber measuring 1.2 cm. Pancreas: There is abrupt cut off of the common bile duct at the level of the head of pancreas where there is a focal area of enhancement measuring 1.8 cm, image 36 of series 2 and image 40 of series 5. main duct dilatation measuring up to 5 mm is identified, image 28 of series 2. There is mild atrophy of the body and tail of  pancreas. Spleen: Normal in size without focal abnormality. Adrenals/Urinary Tract: The adrenal glands are normal. Normal adrenal glands. Small bilateral kidney lesions are too small to reliably characterize. No hydronephrosis is identified. Urinary bladder appears within normal limits. Stomach/Bowel: No pathologic dilatation of the small or large bowel loops. The appendix is visualized and appears normal. Distal colonic diverticula noted without acute inflammation. Vascular/Lymphatic: Aortic atherosclerosis. No aneurysm. No upper abdominal adenopathy. No pelvic or inguinal adenopathy. Reproductive: Previous prostatectomy. Other: No free fluid or fluid collections. No peritoneal nodularity identified. Musculoskeletal: Probable bone island noted within the left iliac bone. No suspicious bone lesions identified. Age-indeterminate T10 compression deformity is new from 10/16/2016. Unchanged T12 compression fracture. Moderate to advanced degenerative disc disease is noted throughout the lumbar spine. IMPRESSION: 1. Interval development of moderate to marked intrahepatic biliary dilatation with increase caliber of the common bile duct, new from 08/05/2017. There is an abrupt cut off of the common bile duct at the level of the head of pancreas. A suspicious area of enhancement at the level of the cut off is identified. Findings are suspicious for lesion within head of pancreas. Further evaluation with contrast enhanced pancreas protocol is advised. 2. There are 2 ill-defined lesions within the liver. These are indeterminate but are unchanged when compared with 10/16/2016. These can may be further characterized with MRI. 3.  Aortic Atherosclerosis (ICD10-I70.0). 4. T10 and T12 compression fractures. Electronically Signed   By: Kerby Moors M.D.   On: 09/25/2017 19:58   Mr 3d Recon At Scanner  Result Date: 09/26/2017 CLINICAL DATA:  84 year old male with history of painless obstructive jaundice. Lack of appetite. 8-10  pound weight loss over the past several weeks. Abdominal discomfort. EXAM: MRI ABDOMEN WITHOUT AND WITH CONTRAST (INCLUDING MRCP) TECHNIQUE: Multiplanar multisequence MR imaging of the abdomen was performed both before and after the administration of intravenous contrast. Heavily T2-weighted images of the biliary and pancreatic ducts were obtained, and three-dimensional MRCP images were rendered by post processing. CONTRAST:  6mL MULTIHANCE GADOBENATE DIMEGLUMINE 529 MG/ML IV SOLN COMPARISON:  No priors. FINDINGS: Lower chest: Unremarkable. Hepatobiliary: 1.4 x 0.9 cm T1 hypointense, T2 hyperintense, nonenhancing lesion in the central aspect of segment 8 of the liver is compatible with a small cyst. Additionally,  in the central aspect of segments 7 and 8 there is a 2.5 x 1.5 cm T1 hypointense, T2 hyperintense, nonenhancing lesion (axial image 44 of series 20), compatible with a small cyst. No other suspicious hepatic lesions are noted. Status post cholecystectomy. MRCP images demonstrate moderate intra and extrahepatic biliary ductal dilatation. Notably, the common bile duct abruptly terminates as it approaches the pancreatic head, with an appearance suggestive of compression from an extrinsic source. No intraductal lesion identified to suggest retained choledocholithiasis. Pancreas: There is a multilocular mixed cystic and solid lesion in the head of the pancreas which is poorly defined on today's examination, but coincides with the area of extrinsic mass effect upon the common bile duct, highly concerning for primary pancreatic neoplasm. The area of apparent involvement is estimated to measure approximately 2.6 x 3.6 cm (axial image 23 of series 14), as demonstrated by multiple small T1 hypointense, T2 hyperintense, nonenhancing cysts, as well as some amorphous soft tissue stroma between the cysts which demonstrates heterogeneous low level enhancement. This is also associated with diffuse pancreatic ductal  dilatation measuring up to 6 mm in the body of the pancreas. No peripancreatic fluid or inflammatory changes. Spleen:  Unremarkable. Adrenals/Urinary Tract: Multiple T1 hypointense, T2 hyperintense, nonenhancing lesions are noted in both kidneys, compatible with multiple simple cysts, all of which are subcentimeter in size. No hydroureteronephrosis in the visualized portions of the abdomen. Bilateral adrenal glands are normal in appearance. Stomach/Bowel: Visualized portions are unremarkable. Vascular/Lymphatic: No aneurysm identified in the visualized abdominal vasculature. No lymphadenopathy noted in the visualized portions of the abdomen. Other:  No significant volume portions of the peritoneal cavity. Musculoskeletal: No aggressive appearing osseous lesions are noted in the visualized portions of the skeleton. IMPRESSION: 1. 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 Further evaluation with endoscopy for endoscopic ultrasound and biopsy is in the near future to establish a tissue diagnosis. 2. 2 small lesions in the liver are compatible with cysts. Electronically Signed   By: Vinnie Langton M.D.   On: 09/26/2017 09:36   Mr Abdomen Mrcp Moise Boring Contast  Result Date: 09/26/2017 CLINICAL DATA:  85 year old male with history of painless obstructive jaundice. Lack of appetite. 8-10 pound weight loss over the past several weeks. Abdominal discomfort. EXAM: MRI ABDOMEN WITHOUT AND WITH CONTRAST (INCLUDING MRCP) TECHNIQUE: Multiplanar multisequence MR imaging of the abdomen was performed both before and after the administration of intravenous contrast. Heavily T2-weighted images of the biliary and pancreatic ducts were obtained, and three-dimensional MRCP images were rendered by post processing. CONTRAST:  35mL MULTIHANCE GADOBENATE DIMEGLUMINE 529 MG/ML IV SOLN COMPARISON:  No  priors. FINDINGS: Lower chest: Unremarkable. Hepatobiliary: 1.4 x 0.9 cm T1 hypointense, T2 hyperintense, nonenhancing lesion in the central aspect of segment 8 of the liver is compatible with a small cyst. Additionally, in the central aspect of segments 7 and 8 there is a 2.5 x 1.5 cm T1 hypointense, T2 hyperintense, nonenhancing lesion (axial image 44 of series 20), compatible with a small cyst. No other suspicious hepatic lesions are noted. Status post cholecystectomy. MRCP images demonstrate moderate intra and extrahepatic biliary ductal dilatation. Notably, the common bile duct abruptly terminates as it approaches the pancreatic head, with an appearance suggestive of compression from an extrinsic source. No intraductal lesion identified to suggest retained choledocholithiasis. Pancreas: There is a multilocular mixed cystic and solid lesion  in the head of the pancreas which is poorly defined on today's examination, but coincides with the area of extrinsic mass effect upon the common bile duct, highly concerning for primary pancreatic neoplasm. The area of apparent involvement is estimated to measure approximately 2.6 x 3.6 cm (axial image 23 of series 14), as demonstrated by multiple small T1 hypointense, T2 hyperintense, nonenhancing cysts, as well as some amorphous soft tissue stroma between the cysts which demonstrates heterogeneous low level enhancement. This is also associated with diffuse pancreatic ductal dilatation measuring up to 6 mm in the body of the pancreas. No peripancreatic fluid or inflammatory changes. Spleen:  Unremarkable. Adrenals/Urinary Tract: Multiple T1 hypointense, T2 hyperintense, nonenhancing lesions are noted in both kidneys, compatible with multiple simple cysts, all of which are subcentimeter in size. No hydroureteronephrosis in the visualized portions of the abdomen. Bilateral adrenal glands are normal in appearance. Stomach/Bowel: Visualized portions are unremarkable.  Vascular/Lymphatic: No aneurysm identified in the visualized abdominal vasculature. No lymphadenopathy noted in the visualized portions of the abdomen. Other:  No significant volume portions of the peritoneal cavity. Musculoskeletal: No aggressive appearing osseous lesions are noted in the visualized portions of the skeleton. IMPRESSION: 1. 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 Further evaluation with endoscopy for endoscopic ultrasound and biopsy is in the near future to establish a tissue diagnosis. 2. 2 small lesions in the liver are compatible with cysts. Electronically Signed   By: Vinnie Langton M.D.   On: 09/26/2017 09:36   US Abdomen Limited Ruq  Result Date: 09/25/2017 CLINICAL DATA:  Hepatitis.  Elevated LFTs. EXAM: ULTRASOUND ABDOMEN LIMITED RIGHT UPPER QUADRANT COMPARISON:  CT abdomen and pelvis dated 08/05/2017. FINDINGS: Gallbladder: Surgically absent. Common bile duct: Diameter: New dilatation up to 1.1 cm, previously 6 mm on prior CT. Liver: No focal lesion identified. New mild intrahepatic biliary dilatation. Within normal limits in parenchymal echogenicity. Portal vein is patent on color Doppler imaging with normal direction of blood flow towards the liver. IMPRESSION: 1. New extra- and intrahepatic biliary dilatation when compared to prior CT. Further evaluation with ERCP or MRCP (if the patient can tolerate breath holds) is recommended. Electronically Signed   By: Titus Dubin M.D.   On: 09/25/2017 17:11      Impression / Plan:   Gerald Alvarez. is a 84 y.o. y/o male with short of jaundice with markedly increased bilirubin and liver enzymes. The patient will be set up for an ERCP for tomorrow. The patient has already in breakfast this morning. The patient and family have been explained the risks and benefits of the ERCP. I agree with  the CA 19-9 levels to be checked. The patient's bilirubin on admission was 13.9 with alkaline phosphatase of 537. The patient and family have been told the risks of pancreatitis perforation bleeding and death. He agreed to proceed with the procedure.  Thank you for involving me in the care of this patient.      LOS: 1 day   Gerald Lame, MD  09/26/2017, 10:13 AM   Note: This dictation was prepared with Dragon dictation along with smaller phrase technology. Any transcriptional errors that result from this process are unintentional.

## 2017-09-27 ENCOUNTER — Encounter: Payer: Self-pay | Admitting: Anesthesiology

## 2017-09-27 ENCOUNTER — Inpatient Hospital Stay: Payer: Medicare Other | Admitting: Anesthesiology

## 2017-09-27 ENCOUNTER — Encounter
Admission: EM | Disposition: A | Discharge status: Home / Self Care | Payer: Self-pay | Source: Home / Self Care | Attending: Internal Medicine

## 2017-09-27 ENCOUNTER — Inpatient Hospital Stay: Payer: Medicare Other

## 2017-09-27 HISTORY — PX: ENDOSCOPIC RETROGRADE CHOLANGIOPANCREATOGRAPHY (ERCP) WITH PROPOFOL: SHX5810

## 2017-09-27 LAB — GLUCOSE, CAPILLARY
GLUCOSE-CAPILLARY: 153 mg/dL — AB (ref 65–99)
GLUCOSE-CAPILLARY: 227 mg/dL — AB (ref 65–99)
Glucose-Capillary: 198 mg/dL — ABNORMAL HIGH (ref 65–99)
Glucose-Capillary: 148 mg/dL — ABNORMAL HIGH (ref 65–99)

## 2017-09-27 SURGERY — ENDOSCOPIC RETROGRADE CHOLANGIOPANCREATOGRAPHY (ERCP) WITH PROPOFOL
Anesthesia: General

## 2017-09-27 MED ORDER — PROPOFOL 10 MG/ML IV BOLUS
INTRAVENOUS | Status: DC | PRN
  Administered 2017-09-27: 20 mg via INTRAVENOUS

## 2017-09-27 MED ORDER — INDOMETHACIN 50 MG RE SUPP
100.0000 mg | Freq: Once | RECTAL | Status: DC
  Filled 2017-09-27: qty 2

## 2017-09-27 MED ORDER — INDOMETHACIN 50 MG RE SUPP
RECTAL | Status: DC | PRN
  Administered 2017-09-27: 100 mg via RECTAL

## 2017-09-27 MED ORDER — PROPOFOL 500 MG/50ML IV EMUL
INTRAVENOUS | Status: DC | PRN
  Administered 2017-09-27: 75 ug/kg/min via INTRAVENOUS

## 2017-09-27 NOTE — Op Note (Signed)
[facility] Patient Name: Gerald Alvarez Procedure Date: 09/27/2017 7:43 AM MRN: [id number] Account #: [id number] Date of Birth: [birth year] Admit Type: Inpatient Age: 84 Room: [facility] ENDO ROOM 4 Gender: Male Note Status: Finalized Procedure:            ERCP Indications:          Jaundice, Elevated liver enzymes, Tumor of the head of                        pancreas Providers:            Lucilla Lame MD, MD Medicines:            Propofol per Anesthesia Complications:        No immediate complications. Procedure:            Pre-Anesthesia Assessment:                       - Prior to the procedure, a History and Physical was                        performed, and patient medications and allergies were                        reviewed. The patient's tolerance of previous                        anesthesia was also reviewed. The risks and benefits of                        the procedure and the sedation options and risks were                        discussed with the patient. All questions were                        answered, and informed consent was obtained. Prior                        Anticoagulants: The patient has taken no previous                        anticoagulant or antiplatelet agents. ASA Grade                        Assessment: II - A patient with mild systemic disease.                        After reviewing the risks and benefits, the patient was                        deemed in satisfactory condition to undergo the                        procedure.                       After obtaining informed consent, the scope was passed                        under direct vision. Throughout the  procedure, the                        patient's blood pressure, pulse, and oxygen saturations                        were monitored continuously. The Endoscope was                        introduced through the mouth, and used to inject   contrast into and used to cannulate the bile duct. The                        ERCP was accomplished without difficulty. The patient                        tolerated the procedure well. Findings:      A scout film of the abdomen was obtained. Surgical clips were seen in       the area of the cystic duct. The esophagus was successfully intubated       under direct vision. The scope was advanced to a normal major papilla in       the descending duodenum without detailed examination of the pharynx,       larynx and associated structures, and upper GI tract. The upper GI tract       was grossly normal. The bile duct was deeply cannulated with the       short-nosed traction sphincterotome. Contrast was injected. I personally       interpreted the bile duct images. There was brisk flow of contrast       through the ducts. Image quality was excellent. Contrast extended to the       entire biliary tree. The lower third of the main bile duct contained a       single segmental stenosis 10 mm in length. The main bile duct was       diffusely dilated, with extrinsic compression causing an obstruction. A       wire was passed into the biliary tree. A 7 mm biliary sphincterotomy was       made with a sphincterotome using ERBE electrocautery. There was no       post-sphincterotomy bleeding. One 10 Fr by 7 cm plastic stent with a       single external flap and a single internal flap was placed 5 cm into the       common bile duct. Bile flowed through the stent. The stent was in good       position. Impression:           - A segmental biliary stricture was found.                       - The entire main bile duct was dilated, with extrinsic                        compression causing an obstruction.                       - A biliary sphincterotomy was performed.                       - One plastic stent was placed into the  common bile                        duct. Recommendation:       - Return patient to  hospital ward for ongoing care.                       - Continue present medications. Procedure Code(s):    --- Professional ---                       [phone], Endoscopic retrograde cholangiopancreatography                        (ERCP); with placement of endoscopic stent into biliary                        or pancreatic duct, including pre- and post-dilation                        and guide wire passage, when performed, including                        sphincterotomy, when performed, each stent                       [id number], Endoscopic catheterization of the biliary ductal                        system, radiological supervision and interpretation Diagnosis Code(s):    --- Professional ---                       R17, Unspecified jaundice                       R74.8, Abnormal levels of other serum enzymes                       D49.0, Neoplasm of unspecified behavior of digestive                        system                       K83.1, Obstruction of bile duct CPT copyright 2016 [employer]. All rights reserved. The codes documented in this report are preliminary and upon coder review may  be revised to meet current compliance requirements. Lucilla Lame MD, MD 09/27/2017 8:53:54 AM This report has been signed electronically. Number of Addenda: 0 Note Initiated On: 09/27/2017 7:43 AM      [facility]

## 2017-09-27 NOTE — Anesthesia Post-op Follow-up Note (Signed)
Anesthesia QCDR form completed.        

## 2017-09-27 NOTE — Progress Notes (Signed)
Initial Nutrition Assessment  DOCUMENTATION CODES:   Non-severe (moderate) malnutrition in context of chronic illness  INTERVENTION:  Once diet advanced, recommend providing Premier Protein po BID, each supplement provides 160 kcal and 30 grams of protein.  Discussed increased needs for calories and protein. Encouraged adequate intake through meals and ONS.  NUTRITION DIAGNOSIS:   Moderate Malnutrition related to chronic illness(obstructing pancreatic mass) as evidenced by moderate fat depletion, moderate muscle depletion.  GOAL:   Patient will meet greater than or equal to 90% of their needs  MONITOR:   PO intake, Supplement acceptance, Diet advancement, Labs, Weight trends, Skin, I & O's  REASON FOR ASSESSMENT:   Malnutrition Screening Tool    ASSESSMENT:   83 year old male with PMHx of BPH, hx prostate cancer s/p prostatectomy, DM type 2 who was admitted with jaundice and obstructing pancreatic mass s/p ERCP with stent placement 1/20.   Met with patient and his family members at bedside. He has had a poor appetite for the past 2-3 months. He reports it is related to the fluid building up on his abdomen, causing pain and nausea. He has not been able to eat well, but is unable to provide details of intake PTA. He is on CLD today following ERCP.   Patient reports he lost from 170 lbs to current weight over the past 2 weeks. Does not align with weights in chart. He was 154 lbs on 09/11/2017 and has lost about 8 lbs of fluid over the past few weeks. He was 162.4 lbs on 03/20/2017, so he has lost about 16.1 lbs of true weight (9.9% body weight) over the past 6 months, which is significant for time frame. Weight still a little falsely elevated as patient still has fluid on his abdomen. He has tried Ensure at home before but he actually likes the store-brand better than regular Ensure. He would like to try a different ONS here.  Medications reviewed and include: Xanax, Vitamin D 2000  units daily, Novolog 0-9 units TID, pantoprazole, sertraline, vitamin B12 1000 micrograms daily.  Labs reviewed: CBG 153-246, HgbA1c 7.2, Potassium 3.2, Creatinine 0.46.  NUTRITION - FOCUSED PHYSICAL EXAM:   Most Recent Value  Orbital Region  Moderate depletion  Upper Arm Region  Moderate depletion  Thoracic and Lumbar Region  Moderate depletion  Buccal Region  Moderate depletion  Temple Region  Mild depletion  Clavicle Bone Region  Moderate depletion  Clavicle and Acromion Bone Region  Moderate depletion  Scapular Bone Region  Moderate depletion  Dorsal Hand  Moderate depletion  Patellar Region  Moderate depletion  Anterior Thigh Region  Moderate depletion  Posterior Calf Region  Moderate depletion  Edema (RD Assessment)  -- [fluid on abdomen]  Hair  Reviewed  Eyes  Reviewed  Mouth  Reviewed  Skin  Reviewed [jaundice]  Nails  Reviewed     Diet Order:  Diet clear liquid Room service appropriate? Yes; Fluid consistency: Thin  EDUCATION NEEDS:   Education needs have been addressed  Skin:  Skin Assessment: Reviewed RN Assessment(ecchymosis to bilateral arms)  Last BM:  09/26/2017  Height:   Ht Readings from Last 1 Encounters:  09/27/17 5' 2"  (1.575 m)    Weight:   Wt Readings from Last 1 Encounters:  09/25/17 146 lb 4 oz (66.3 kg)    Ideal Body Weight:  53.6 kg  BMI:  Body mass index is 26.75 kg/m.  Estimated Nutritional Needs:   Kcal:  1580-1820 (MSJ x 1.3-1.5)  Protein:  80-100  grams (1.2-1.5 grams/kg)  Fluid:  1.6 L/day (25 mL/kg)  Willey Blade, MS, RD, LDN Office: [phone] Pager: [phone] After Hours/Weekend Pager: [phone]

## 2017-09-27 NOTE — Progress Notes (Signed)
15 minutes call to floor.

## 2017-09-27 NOTE — Plan of Care (Signed)
  Progressing Spiritual Needs Ability to function at adequate level 09/27/2017 1309 - Progressing by Cherylann Parr, RN Education: Knowledge of General Education information will improve 09/27/2017 1309 - Progressing by Cherylann Parr, RN Health Behavior/Discharge Planning: Ability to manage health-related needs will improve 09/27/2017 1309 - Progressing by Cherylann Parr, RN Clinical Measurements: Ability to maintain clinical measurements within normal limits will improve 09/27/2017 1309 - Progressing by Cherylann Parr, RN Will remain free from infection 09/27/2017 1309 - Progressing by Cherylann Parr, RN Diagnostic test results will improve 09/27/2017 1309 - Progressing by Cherylann Parr, RN Respiratory complications will improve 09/27/2017 1309 - Progressing by Cherylann Parr, RN Cardiovascular complication will be avoided 09/27/2017 1309 - Progressing by Cherylann Parr, RN Activity: Risk for activity intolerance will decrease 09/27/2017 1309 - Progressing by Cherylann Parr, RN Nutrition: Adequate nutrition will be maintained 09/27/2017 1309 - Progressing by Cherylann Parr, RN Coping: Level of anxiety will decrease 09/27/2017 1309 - Progressing by Cherylann Parr, RN Elimination: Will not experience complications related to bowel motility 09/27/2017 1309 - Progressing by Cherylann Parr, RN Will not experience complications related to urinary retention 09/27/2017 1309 - Progressing by Cherylann Parr, RN Safety: Ability to remain free from injury will improve 09/27/2017 1309 - Progressing by Cherylann Parr, RN Skin Integrity: Risk for impaired skin integrity will decrease 09/27/2017 1309 - Progressing by Cherylann Parr, RN

## 2017-09-27 NOTE — Anesthesia Postprocedure Evaluation (Signed)
Anesthesia Post Note  Patient: Gerald Alvarez.  Procedure(s) Performed: ENDOSCOPIC RETROGRADE CHOLANGIOPANCREATOGRAPHY (ERCP) WITH PROPOFOL (N/A )  Patient location during evaluation: Endoscopy Anesthesia Type: General Level of consciousness: awake and alert Pain management: pain level controlled Vital Signs Assessment: post-procedure vital signs reviewed and stable Respiratory status: spontaneous breathing, nonlabored ventilation, respiratory function stable and patient connected to nasal cannula oxygen Cardiovascular status: blood pressure returned to baseline and stable Postop Assessment: no apparent nausea or vomiting Anesthetic complications: no     Last Vitals:  Vitals:   09/27/17 0906 09/27/17 0921  BP: (!) 156/60 (!) 158/71  Pulse: 63 62  Resp: 17 (!) 25  Temp:    SpO2: 100% 99%    Last Pain:  Vitals:   09/27/17 0737  TempSrc: Tympanic                 Martha Clan

## 2017-09-27 NOTE — Progress Notes (Addendum)
Patient ID: Gerald Angst., male   DOB: [birth year], 84 y.o.   MRN: [id number]  [facility] PROGRESS NOTE  Gerald Angst. GLO:[id number] DOB: 06/14/1926 DOA: 09/25/2017 PCP: Rusty Aus, MD  HPI/Subjective: Patient was seen after procedure and was a little bit drowsy.  He states he feels okay.  No abdominal pain or nausea vomiting.  Objective: Vitals:   09/27/17 0906 09/27/17 0921  BP: (!) 156/60 (!) 158/71  Pulse: 63 62  Resp: 17 (!) 25  Temp:    SpO2: 100% 99%    Filed Weights   09/25/17 2252  Weight: 66.3 kg (146 lb 4 oz)    ROS: Review of Systems  Unable to perform ROS: Acuity of condition  Gastrointestinal: Negative for abdominal pain, nausea and vomiting.   Exam: Physical Exam  Constitutional: He is oriented to person, place, and time.  HENT:  Nose: No mucosal edema.  Mouth/Throat: No oropharyngeal exudate or posterior oropharyngeal edema.  Eyes: EOM and lids are normal. Pupils are equal, round, and reactive to light.  Conjunctiva icteric  Neck: No JVD present. Carotid bruit is not present. No edema present. No thyroid mass and no thyromegaly present.  Cardiovascular: S1 normal and S2 normal. Exam reveals no gallop.  No murmur heard. Pulses:      Dorsalis pedis pulses are 2+ on the right side, and 2+ on the left side.  Respiratory: No respiratory distress. He has no wheezes. He has no rhonchi. He has no rales.  GI: Soft. Bowel sounds are normal. There is no tenderness.  Musculoskeletal:       Right ankle: He exhibits no swelling.       Left ankle: He exhibits no swelling.  Lymphadenopathy:    He has no cervical adenopathy.  Neurological: He is alert and oriented to person, place, and time. No cranial nerve deficit.  Skin: Skin is warm. Nails show no clubbing.  Jaundice  Psychiatric: He has a normal mood and affect.      Data Reviewed: Basic Metabolic Panel: Recent Labs  Lab 09/25/17 1810 09/26/17 0546  NA 134* 135  K 4.0 3.2*  CL 99*  103  CO2 25 22  GLUCOSE 74 130*  BUN 24* 17  CREATININE 0.55* 0.46*  CALCIUM 8.6* 8.2*   Liver Function Tests: Recent Labs  Lab 09/25/17 1810 09/26/17 0546  AST 587* 474*  ALT 814* 622*  ALKPHOS 534* 483*  BILITOT 13.9* 12.7*  PROT 7.0 5.9*  ALBUMIN 3.4* 2.8*   Recent Labs  Lab 09/25/17 1810  LIPASE 19   CBC: Recent Labs  Lab 09/25/17 1705 09/26/17 0546  WBC 5.5 4.5  NEUTROABS 3.2  --   HGB 15.2 13.7  HCT 44.4 39.0*  MCV 101.4* 101.7*  PLT 267 204    CBG: Recent Labs  Lab 09/26/17 1148 09/26/17 1700 09/26/17 1935 09/27/17 0954 09/27/17 1247  GLUCAP 246* 186* 165* 153* 227*     Studies: Ct Abdomen Pelvis W Contrast  Result Date: 09/25/2017 CLINICAL DATA:  Jaundice.  Biliary obstruction. EXAM: CT ABDOMEN AND PELVIS WITH CONTRAST TECHNIQUE: Multidetector CT imaging of the abdomen and pelvis was performed using the standard protocol following bolus administration of intravenous contrast. CONTRAST:  162mL ISOVUE-300 IOPAMIDOL (ISOVUE-300) INJECTION 61% COMPARISON:  08/05/2017.  And 10/16/2016 FINDINGS: Lower chest: No pleural or pericardial effusion. The lung bases appear clear. Calcifications within the LAD coronary artery identified. Aortic atherosclerosis noted. Hepatobiliary: Along the dome of the liver within the medial segment of  left lobe there is a 1.6 cm ill-defined low-attenuation lesion measuring 1.5 cm, image 16 of series 2. A second lesion is identified within the medial right lobe of liver measuring 2.6 x 1.3 cm. These are not significantly changed when compared with 10/16/16. Status post cholecystectomy. There is moderate to marked intrahepatic biliary dilatation. The common bile duct is increased in caliber measuring 1.2 cm. Pancreas: There is abrupt cut off of the common bile duct at the level of the head of pancreas where there is a focal area of enhancement measuring 1.8 cm, image 36 of series 2 and image 40 of series 5. main duct dilatation measuring  up to 5 mm is identified, image 28 of series 2. There is mild atrophy of the body and tail of pancreas. Spleen: Normal in size without focal abnormality. Adrenals/Urinary Tract: The adrenal glands are normal. Normal adrenal glands. Small bilateral kidney lesions are too small to reliably characterize. No hydronephrosis is identified. Urinary bladder appears within normal limits. Stomach/Bowel: No pathologic dilatation of the small or large bowel loops. The appendix is visualized and appears normal. Distal colonic diverticula noted without acute inflammation. Vascular/Lymphatic: Aortic atherosclerosis. No aneurysm. No upper abdominal adenopathy. No pelvic or inguinal adenopathy. Reproductive: Previous prostatectomy. Other: No free fluid or fluid collections. No peritoneal nodularity identified. Musculoskeletal: Probable bone island noted within the left iliac bone. No suspicious bone lesions identified. Age-indeterminate T10 compression deformity is new from 10/16/2016. Unchanged T12 compression fracture. Moderate to advanced degenerative disc disease is noted throughout the lumbar spine. IMPRESSION: 1. Interval development of moderate to marked intrahepatic biliary dilatation with increase caliber of the common bile duct, new from 08/05/2017. There is an abrupt cut off of the common bile duct at the level of the head of pancreas. A suspicious area of enhancement at the level of the cut off is identified. Findings are suspicious for lesion within head of pancreas. Further evaluation with contrast enhanced pancreas protocol is advised. 2. There are 2 ill-defined lesions within the liver. These are indeterminate but are unchanged when compared with 10/16/2016. These can may be further characterized with MRI. 3.  Aortic Atherosclerosis (ICD10-I70.0). 4. T10 and T12 compression fractures. Electronically Signed   By: Kerby Moors M.D.   On: 09/25/2017 19:58   Mr 3d Recon At Scanner  Result Date: 09/26/2017 CLINICAL  DATA:  84 year old male with history of painless obstructive jaundice. Lack of appetite. 8-10 pound weight loss over the past several weeks. Abdominal discomfort. EXAM: MRI ABDOMEN WITHOUT AND WITH CONTRAST (INCLUDING MRCP) TECHNIQUE: Multiplanar multisequence MR imaging of the abdomen was performed both before and after the administration of intravenous contrast. Heavily T2-weighted images of the biliary and pancreatic ducts were obtained, and three-dimensional MRCP images were rendered by post processing. CONTRAST:  5mL MULTIHANCE GADOBENATE DIMEGLUMINE 529 MG/ML IV SOLN COMPARISON:  No priors. FINDINGS: Lower chest: Unremarkable. Hepatobiliary: 1.4 x 0.9 cm T1 hypointense, T2 hyperintense, nonenhancing lesion in the central aspect of segment 8 of the liver is compatible with a small cyst. Additionally, in the central aspect of segments 7 and 8 there is a 2.5 x 1.5 cm T1 hypointense, T2 hyperintense, nonenhancing lesion (axial image 44 of series 20), compatible with a small cyst. No other suspicious hepatic lesions are noted. Status post cholecystectomy. MRCP images demonstrate moderate intra and extrahepatic biliary ductal dilatation. Notably, the common bile duct abruptly terminates as it approaches the pancreatic head, with an appearance suggestive of compression from an extrinsic source. No intraductal lesion identified to  suggest retained choledocholithiasis. Pancreas: There is a multilocular mixed cystic and solid lesion in the head of the pancreas which is poorly defined on today's examination, but coincides with the area of extrinsic mass effect upon the common bile duct, highly concerning for primary pancreatic neoplasm. The area of apparent involvement is estimated to measure approximately 2.6 x 3.6 cm (axial image 23 of series 14), as demonstrated by multiple small T1 hypointense, T2 hyperintense, nonenhancing cysts, as well as some amorphous soft tissue stroma between the cysts which demonstrates  heterogeneous low level enhancement. This is also associated with diffuse pancreatic ductal dilatation measuring up to 6 mm in the body of the pancreas. No peripancreatic fluid or inflammatory changes. Spleen:  Unremarkable. Adrenals/Urinary Tract: Multiple T1 hypointense, T2 hyperintense, nonenhancing lesions are noted in both kidneys, compatible with multiple simple cysts, all of which are subcentimeter in size. No hydroureteronephrosis in the visualized portions of the abdomen. Bilateral adrenal glands are normal in appearance. Stomach/Bowel: Visualized portions are unremarkable. Vascular/Lymphatic: No aneurysm identified in the visualized abdominal vasculature. No lymphadenopathy noted in the visualized portions of the abdomen. Other:  No significant volume portions of the peritoneal cavity. Musculoskeletal: No aggressive appearing osseous lesions are noted in the visualized portions of the skeleton. IMPRESSION: 1. 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 Further evaluation with endoscopy for endoscopic ultrasound and biopsy is in the near future to establish a tissue diagnosis. 2. 2 small lesions in the liver are compatible with cysts. Electronically Signed   By: Vinnie Langton M.D.   On: 09/26/2017 09:36   Dg C-arm 1-60 Min-no Report  Result Date: 09/27/2017 Fluoroscopy was utilized by the requesting physician.  No radiographic interpretation.   Mr Abdomen Mrcp Moise Boring Contast  Result Date: 09/26/2017 CLINICAL DATA:  84 year old male with history of painless obstructive jaundice. Lack of appetite. 8-10 pound weight loss over the past several weeks. Abdominal discomfort. EXAM: MRI ABDOMEN WITHOUT AND WITH CONTRAST (INCLUDING MRCP) TECHNIQUE: Multiplanar multisequence MR imaging of the abdomen was performed both before and after the administration of intravenous  contrast. Heavily T2-weighted images of the biliary and pancreatic ducts were obtained, and three-dimensional MRCP images were rendered by post processing. CONTRAST:  20mL MULTIHANCE GADOBENATE DIMEGLUMINE 529 MG/ML IV SOLN COMPARISON:  No priors. FINDINGS: Lower chest: Unremarkable. Hepatobiliary: 1.4 x 0.9 cm T1 hypointense, T2 hyperintense, nonenhancing lesion in the central aspect of segment 8 of the liver is compatible with a small cyst. Additionally, in the central aspect of segments 7 and 8 there is a 2.5 x 1.5 cm T1 hypointense, T2 hyperintense, nonenhancing lesion (axial image 44 of series 20), compatible with a small cyst. No other suspicious hepatic lesions are noted. Status post cholecystectomy. MRCP images demonstrate moderate intra and extrahepatic biliary ductal dilatation. Notably, the common bile duct abruptly terminates as it approaches the pancreatic head, with an appearance suggestive of compression from an extrinsic source. No intraductal lesion identified to suggest retained choledocholithiasis. Pancreas: There is a multilocular mixed cystic and solid lesion in the head of the pancreas which is poorly defined on today's examination, but coincides with the area of extrinsic mass effect upon the common bile duct, highly concerning for primary pancreatic neoplasm. The area of apparent involvement is estimated to measure approximately 2.6 x 3.6 cm (axial image 23 of series 14), as demonstrated by multiple small T1  hypointense, T2 hyperintense, nonenhancing cysts, as well as some amorphous soft tissue stroma between the cysts which demonstrates heterogeneous low level enhancement. This is also associated with diffuse pancreatic ductal dilatation measuring up to 6 mm in the body of the pancreas. No peripancreatic fluid or inflammatory changes. Spleen:  Unremarkable. Adrenals/Urinary Tract: Multiple T1 hypointense, T2 hyperintense, nonenhancing lesions are noted in both kidneys, compatible with  multiple simple cysts, all of which are subcentimeter in size. No hydroureteronephrosis in the visualized portions of the abdomen. Bilateral adrenal glands are normal in appearance. Stomach/Bowel: Visualized portions are unremarkable. Vascular/Lymphatic: No aneurysm identified in the visualized abdominal vasculature. No lymphadenopathy noted in the visualized portions of the abdomen. Other:  No significant volume portions of the peritoneal cavity. Musculoskeletal: No aggressive appearing osseous lesions are noted in the visualized portions of the skeleton. IMPRESSION: 1. 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 Further evaluation with endoscopy for endoscopic ultrasound and biopsy is in the near future to establish a tissue diagnosis. 2. 2 small lesions in the liver are compatible with cysts. Electronically Signed   By: Vinnie Langton M.D.   On: 09/26/2017 09:36   US Abdomen Limited Ruq  Result Date: 09/25/2017 CLINICAL DATA:  Hepatitis.  Elevated LFTs. EXAM: ULTRASOUND ABDOMEN LIMITED RIGHT UPPER QUADRANT COMPARISON:  CT abdomen and pelvis dated 08/05/2017. FINDINGS: Gallbladder: Surgically absent. Common bile duct: Diameter: New dilatation up to 1.1 cm, previously 6 mm on prior CT. Liver: No focal lesion identified. New mild intrahepatic biliary dilatation. Within normal limits in parenchymal echogenicity. Portal vein is patent on color Doppler imaging with normal direction of blood flow towards the liver. IMPRESSION: 1. New extra- and intrahepatic biliary dilatation when compared to prior CT. Further evaluation with ERCP or MRCP (if the patient can tolerate breath holds) is recommended. Electronically Signed   By: Titus Dubin M.D.   On: 09/25/2017 17:11    Scheduled Meds: . ALPRAZolam  0.5 mg Oral QHS  . cholecalciferol  2,000 Units Oral Daily  . doxazosin   2 mg Oral Daily  . DULoxetine  20 mg Oral Daily  . indomethacin  100 mg Rectal Once  . insulin aspart  0-9 Units Subcutaneous TID WC  . mirabegron ER  25 mg Oral Daily  . pantoprazole  40 mg Oral BID  . sertraline  50 mg Oral Daily  . sodium chloride flush  3 mL Intravenous Q12H  . traZODone  50 mg Oral QHS  . vitamin B-12  1,000 mcg Oral Daily   Continuous Infusions: . sodium chloride      Assessment/Plan:  1. Jaundice with obstructing pancreatic mass.  Patient had an ERCP and stent this morning.  If liver function test trend better than we may be able to get him home tomorrow 2. History of prostate cancer 3. Anxiety on Xanax 4. Type 2 diabetes on sliding scale 5. Depression on Zoloft and trazodone 6. BPH on doxazosin  Code Status:     Code Status Orders  (From admission, onward)        Start     Ordered   09/25/17 2317  Full code  Continuous     09/25/17 2317    Code Status History    Date Active Date Inactive Code Status Order ID Comments User Context   This patient has a current code status but no  historical code status.    Advance Directive Documentation     Most Recent Value  Type of Advance Directive  Healthcare Power of Attorney, Living will  Pre-existing out of facility DNR order (yellow form or pink MOST form)  No data  "MOST" Form in Place?  No data     Family Communication: Family at bedside Disposition Plan: Hopefully home in a day after ERCP   Consultants:  Gastroenterology  Time spent: 25 minutes  [place]

## 2017-09-27 NOTE — Transfer of Care (Signed)
Immediate Anesthesia Transfer of Care Note  Patient: Gerald Alvarez.  Procedure(s) Performed: ENDOSCOPIC RETROGRADE CHOLANGIOPANCREATOGRAPHY (ERCP) WITH PROPOFOL (N/A )  Patient Location: PACU  Anesthesia Type:General  Level of Consciousness: awake, alert  and oriented  Airway & Oxygen Therapy: Patient Spontanous Breathing and Patient connected to nasal cannula oxygen  Post-op Assessment: Report given to RN  Post vital signs: Reviewed  Last Vitals:  Vitals:   09/27/17 0514 09/27/17 0737  BP: (!) 108/57 (!) 133/59  Pulse: (!) 101 74  Resp: 20 18  Temp: 36.5 C (!) 36.3 C  SpO2: 95% 97%    Last Pain:  Vitals:   09/27/17 0737  TempSrc: Tympanic         Complications: No apparent anesthesia complications

## 2017-09-28 ENCOUNTER — Encounter: Payer: Self-pay | Admitting: Gastroenterology

## 2017-09-28 DIAGNOSIS — E440 Moderate protein-calorie malnutrition: Secondary | ICD-10-CM

## 2017-09-28 LAB — COMPREHENSIVE METABOLIC PANEL
ALBUMIN: 2.6 g/dL — AB (ref 3.5–5.0)
ALT: 477 U/L — ABNORMAL HIGH (ref 17–63)
ANION GAP: 8 (ref 5–15)
AST: 180 U/L — ABNORMAL HIGH (ref 15–41)
Alkaline Phosphatase: 486 U/L — ABNORMAL HIGH (ref 38–126)
BILIRUBIN TOTAL: 6.4 mg/dL — AB (ref 0.3–1.2)
BUN: 14 mg/dL (ref 6–20)
CO2: 25 mmol/L (ref 22–32)
Calcium: 8.4 mg/dL — ABNORMAL LOW (ref 8.9–10.3)
Chloride: 101 mmol/L (ref 101–111)
Creatinine, Ser: 0.58 mg/dL — ABNORMAL LOW (ref 0.61–1.24)
Glucose, Bld: 172 mg/dL — ABNORMAL HIGH (ref 65–99)
POTASSIUM: 3.4 mmol/L — AB (ref 3.5–5.1)
Sodium: 134 mmol/L — ABNORMAL LOW (ref 135–145)
TOTAL PROTEIN: 5.6 g/dL — AB (ref 6.5–8.1)

## 2017-09-28 LAB — GLUCOSE, CAPILLARY
GLUCOSE-CAPILLARY: 200 mg/dL — AB (ref 65–99)
Glucose-Capillary: 179 mg/dL — ABNORMAL HIGH (ref 65–99)

## 2017-09-28 LAB — LIPASE, BLOOD: LIPASE: 23 U/L (ref 11–51)

## 2017-09-28 MED ORDER — PREMIER PROTEIN SHAKE: 11.0000 [oz_av] | Freq: Two times a day (BID) | ORAL | 0 refills | Status: AC

## 2017-09-28 MED ORDER — LEVEMIR FLEXTOUCH 100 UNIT/ML ~~LOC~~ SOPN: 15.0000 [IU] | PEN_INJECTOR | Freq: Every day | SUBCUTANEOUS | Status: DC

## 2017-09-28 MED ORDER — LEVEMIR FLEXTOUCH 100 UNIT/ML ~~LOC~~ SOPN: 15.0000 [IU] | PEN_INJECTOR | Freq: Every day | SUBCUTANEOUS | 11 refills | Status: DC

## 2017-09-28 MED ORDER — PREMIER PROTEIN SHAKE
11.0000 [oz_av] | Freq: Two times a day (BID) | ORAL | Status: DC
  Administered 2017-09-28: 11 [oz_av] via ORAL

## 2017-09-28 MED ORDER — POTASSIUM CHLORIDE CRYS ER 20 MEQ PO TBCR
40.0000 meq | EXTENDED_RELEASE_TABLET | Freq: Once | ORAL | Status: AC
  Administered 2017-09-28: 40 meq via ORAL
  Filled 2017-09-28: qty 2

## 2017-09-28 NOTE — Progress Notes (Signed)
Pt for discharge home. Alert. No resp distress. Instructions discussed with pt and family. meds discussed/ diet/ activity and f/u discussed. Supplements discussed. No voiced c/o. Home at this time via w/c with family w/o c/o.

## 2017-09-28 NOTE — Plan of Care (Signed)
  Progressing Spiritual Needs Ability to function at adequate level 09/28/2017 1156 - Progressing by Rowe Robert, RN Education: Knowledge of General Education information will improve 09/28/2017 1156 - Progressing by Rowe Robert, RN Health Behavior/Discharge Planning: Ability to manage health-related needs will improve 09/28/2017 1156 - Progressing by Rowe Robert, RN Clinical Measurements: Ability to maintain clinical measurements within normal limits will improve 09/28/2017 1156 - Progressing by Rowe Robert, RN Will remain free from infection 09/28/2017 1156 - Progressing by Rowe Robert, RN Diagnostic test results will improve 09/28/2017 1156 - Progressing by Rowe Robert, RN Respiratory complications will improve 09/28/2017 1156 - Progressing by Rowe Robert, RN Cardiovascular complication will be avoided 09/28/2017 1156 - Progressing by Rowe Robert, RN Activity: Risk for activity intolerance will decrease 09/28/2017 1156 - Progressing by Rowe Robert, RN Nutrition: Adequate nutrition will be maintained 09/28/2017 1156 - Progressing by Rowe Robert, RN Coping: Level of anxiety will decrease 09/28/2017 1156 - Progressing by Rowe Robert, RN Elimination: Will not experience complications related to bowel motility 09/28/2017 1156 - Progressing by Rowe Robert, RN Will not experience complications related to urinary retention 09/28/2017 1156 - Progressing by Rowe Robert, RN Safety: Ability to remain free from injury will improve 09/28/2017 1156 - Progressing by Rowe Robert, RN Skin Integrity: Risk for impaired skin integrity will decrease 09/28/2017 1156 - Progressing by Rowe Robert, RN

## 2017-09-28 NOTE — Discharge Summary (Signed)
[facility] [place] [facility] [place] NAME: Gerald Alvarez    MR#:  [id number]  DATE OF BIRTH:  Feb 05, 1926  DATE OF ADMISSION:  09/25/2017 ADMITTING PHYSICIAN: Dustin Flock, MD  DATE OF DISCHARGE: 09/28/2017  PRIMARY CARE PHYSICIAN: Rusty Aus, MD    ADMISSION DIAGNOSIS:  Dehydration [E86.0] Pancreatic mass [K86.9] Common bile duct obstruction [K83.1]  DISCHARGE DIAGNOSIS:  Active Problems:   Jaundice   Common bile duct obstruction   Pancreatic mass   Malnutrition of moderate degree   SECONDARY DIAGNOSIS:   Past Medical History:  Diagnosis Date  . BPH (benign prostatic hyperplasia)   . Diabetes mellitus without complication ([place])   . Incontinence   . Nocturia   . OAB (overactive bladder)   . Prostate cancer [facility])     HOSPITAL COURSE:   1.  Jaundice with obstructing pancreatic mass, elevated liver function test.  Patient had an ERCP and stent on 09/27/2017.  The patient's bilirubin and liver function tests trended better.  Patient's bilirubin was 13.9 when he came in.  After ERCP and stent bilirubin came down to 6.4.  Liver function test also elevated with an AST of 587 and ALT of 814 on admission.  Upon discharge AST is 180 and ALT is 477.  Patient will follow up with Dr. Allen Norris as outpatient.  He will set up for an endoscopic ultrasound with biopsy of this pancreatic mass. 2.  Type 2 diabetes mellitus.  I decreased the patient's Levemir to 15 units subcutaneous injection daily. 3.  History of prostate cancer 4.  Anxiety on Xanax 5.  Depression on Zoloft and trazodone 6.  BPH on doxazosin  DISCHARGE CONDITIONS:   Satisfactory  CONSULTS OBTAINED:  Treatment Team:  Lucilla Lame, MD  DRUG ALLERGIES:   Allergies  Allergen Reactions  . Sulfa Antibiotics Nausea And Vomiting    DISCHARGE MEDICATIONS:   Allergies as of 09/28/2017      Reactions   Sulfa Antibiotics Nausea And Vomiting      Medication List    STOP taking these  medications   acetaminophen 325 MG tablet Commonly known as:  TYLENOL   aspirin 81 MG chewable tablet   cyclobenzaprine 10 MG tablet Commonly known as:  FLEXERIL   HYDROcodone-acetaminophen 5-325 MG tablet Commonly known as:  NORCO/VICODIN   metoprolol tartrate 25 MG tablet Commonly known as:  LOPRESSOR   mirabegron ER 25 MG Tb24 tablet Commonly known as:  MYRBETRIQ     TAKE these medications   ALPRAZolam 0.5 MG tablet Commonly known as:  XANAX Take 0.5 mg by mouth at bedtime.   doxazosin 2 MG tablet Commonly known as:  CARDURA Take 2 mg by mouth daily.   DULoxetine 20 MG capsule Commonly known as:  CYMBALTA Take 1 capsule by mouth daily.   LEVEMIR FLEXTOUCH 100 UNIT/ML Pen Generic drug:  Insulin Detemir Inject 15 Units into the skin daily at 10 pm. What changed:    how much to take  when to take this  additional instructions   MULTI-VITAMINS Tabs Take 1 tablet by mouth daily.   pantoprazole 40 MG tablet Commonly known as:  PROTONIX TAKE 1 TABLET TWICE A DAY   protein supplement shake Liqd Commonly known as:  PREMIER PROTEIN Take 325 mLs (11 oz total) by mouth 2 (two) times daily between meals.   sertraline 50 MG tablet Commonly known as:  ZOLOFT Take 1 tablet by mouth daily.   traZODone 50 MG tablet Commonly known as:  DESYREL Take 50 mg by mouth at bedtime.   vitamin B-12 1000 MCG tablet Commonly known as:  CYANOCOBALAMIN Take 1,000 mcg by mouth daily.   Vitamin D3 2000 units capsule Take 2,000 Units by mouth daily.        DISCHARGE INSTRUCTIONS:   Follow-up with PMD 1 week Follow-up with Dr. Allen Norris gastroenterology 2-week Will need endoscopic ultrasound and biopsy of pancreas as outpatient  If you experience worsening of your admission symptoms, develop shortness of breath, life threatening emergency, suicidal or homicidal thoughts you must seek medical attention immediately by calling 911 or calling your MD immediately  if symptoms less  severe.  You Must read complete instructions/literature along with all the possible adverse reactions/side effects for all the Medicines you take and that have been prescribed to you. Take any new Medicines after you have completely understood and accept all the possible adverse reactions/side effects.   Please note  You were cared for by a hospitalist during your hospital stay. If you have any questions about your discharge medications or the care you received while you were in the hospital after you are discharged, you can call the unit and asked to speak with the hospitalist on call if the hospitalist that took care of you is not available. Once you are discharged, your primary care physician will handle any further medical issues. Please note that NO REFILLS for any discharge medications will be authorized once you are discharged, as it is imperative that you return to your primary care physician (or establish a relationship with a primary care physician if you do not have one) for your aftercare needs so that they can reassess your need for medications and monitor your lab values.    Today   CHIEF COMPLAINT:   Chief Complaint  Patient presents with  . Abnormal Lab    HISTORY OF PRESENT ILLNESS:  Gerald Alvarez  is a 85 y.o. male  came in with jaundice   VITAL SIGNS:  Blood pressure (!) 123/58, pulse 76, temperature (!) 97.5 F (36.4 C), temperature source Oral, resp. rate 16, height 5\' 2"  (1.575 m), weight 66.3 kg (146 lb 4 oz), SpO2 95 %.    PHYSICAL EXAMINATION:  GENERAL:  84 y.o.-year-old patient lying in the bed with no acute distress.  EYES: Pupils equal, round, reactive to light and accommodation.  positive for scleral icterus. Extraocular muscles intact.  HEENT: Head atraumatic, normocephalic. Oropharynx and nasopharynx clear.  NECK:  Supple, no jugular venous distention. No thyroid enlargement, no tenderness.  LUNGS: Normal breath sounds bilaterally, no wheezing,  rales,rhonchi or crepitation. No use of accessory muscles of respiration.  CARDIOVASCULAR: S1, S2 normal. No murmurs, rubs, or gallops.  ABDOMEN: Soft, non-tender, non-distended. Bowel sounds present. No organomegaly or mass.  EXTREMITIES: No pedal edema, cyanosis, or clubbing.  NEUROLOGIC: Cranial nerves II through XII are intact. Muscle strength 5/5 in all extremities. Sensation intact. Gait not checked.  PSYCHIATRIC: The patient is alert and oriented x 3.  SKIN: No obvious rash, lesion, or ulcer.  Positive for jaundice  DATA REVIEW:   CBC Recent Labs  Lab 09/26/17 0546  WBC 4.5  HGB 13.7  HCT 39.0*  PLT 204    Chemistries  Recent Labs  Lab 09/28/17 0451  NA 134*  K 3.4*  CL 101  CO2 25  GLUCOSE 172*  BUN 14  CREATININE 0.58*  CALCIUM 8.4*  AST 180*  ALT 477*  ALKPHOS 486*  BILITOT 6.4*     Management  plans discussed with the patient, family and they are in agreement.  CODE STATUS:     Code Status Orders  (From admission, onward)        Start     Ordered   09/25/17 2317  Full code  Continuous     09/25/17 2317    Code Status History    Date Active Date Inactive Code Status Order ID Comments User Context   This patient has a current code status but no historical code status.    Advance Directive Documentation     Most Recent Value  Type of Advance Directive  Healthcare Power of Attorney, Living will  Pre-existing out of facility DNR order (yellow form or pink MOST form)  No data  "MOST" Form in Place?  No data      TOTAL TIME TAKING CARE OF THIS PATIENT: 35 minutes.    Loletha Grayer M.D on 09/28/2017 at 1:32 PM  Between 7am to 6pm - Pager - [phone]  After 6pm go to [email or web address] - password [email or web address]  [facility] Office  [phone]  CC: Primary care physician; Rusty Aus, MD

## 2017-09-28 NOTE — Care Management Important Message (Signed)
Important Message  Patient Details  Name: Gerald Alvarez. MRN: [id number] Date of Birth: 09/17/1925   Medicare Important Message Given:  Yes    Shelbie Ammons, RN 09/28/2017, 6:30 AM

## 2017-09-29 LAB — CA 19-9 (SERIAL): CAN 19-9: 386 U/mL — AB (ref 0–35)

## 2017-10-01 ENCOUNTER — Telehealth: Payer: Self-pay | Admitting: Gastroenterology

## 2017-10-01 NOTE — Telephone Encounter (Signed)
Patient's daughter called and stated that her father had a stent put in bile duct and he is itching on stomach and back. Angela Nevin is concerned please call her.

## 2017-10-01 NOTE — Telephone Encounter (Signed)
Advised pt's daughter this is common when pt's liver enzymes are elevated. Advised her he can take Benedryl as directed on the bottle. If no improvement when he goes for his oncology appt on Monday, he should discuss with Dr. Grayland Ormond.

## 2017-10-04 NOTE — Progress Notes (Signed)
[name]  Telephone:([phone] [phone]  ID: Gerald Alvarez. OB: Oct 18, 1925  MR#: [id number]  NIO#:[id number]  Patient Care Team: Rusty Aus, MD as PCP - General (Internal Medicine)   CHIEF COMPLAINT: Pancreatic mass  INTERVAL HISTORY: Patient is an 84 year old male who recently had acute onset painless jaundice and was found to have a pancreatic mass highly suspicious for underlying malignancy.  He reports increased nausea and constipation.  He denies any pain.  He has noted a decrease in his appetite, but does not report weight loss.  He has no chest pain or shortness of breath.  He denies any vomiting or diarrhea.  He has no melena or hematochezia.  He has no urinary complaints.  Patient otherwise feels well and offers no further specific complaints.  REVIEW OF SYSTEMS:   Review of Systems  Constitutional: Negative.  Negative for fever, malaise/fatigue and weight loss.  Respiratory: Negative.  Negative for cough and shortness of breath.   Cardiovascular: Negative.  Negative for chest pain and leg swelling.  Gastrointestinal: Positive for constipation and nausea. Negative for abdominal pain, blood in stool, diarrhea and melena.  Genitourinary: Negative.  Negative for dysuria.  Musculoskeletal: Negative.   Skin: Negative.  Negative for rash.  Neurological: Negative for weakness.  Psychiatric/Behavioral: Negative.  The patient is not nervous/anxious.     As per HPI. Otherwise, a complete review of systems is negative.  PAST MEDICAL HISTORY: Past Medical History:  Diagnosis Date  . BPH (benign prostatic hyperplasia)   . Diabetes mellitus without complication ([place])   . Incontinence   . Nocturia   . OAB (overactive bladder)   . Prostate cancer ([place])     PAST SURGICAL HISTORY: Past Surgical History:  Procedure Laterality Date  . CHOLECYSTECTOMY    . ENDOSCOPIC RETROGRADE CHOLANGIOPANCREATOGRAPHY (ERCP) WITH PROPOFOL N/A 09/27/2017   Procedure: ENDOSCOPIC RETROGRADE CHOLANGIOPANCREATOGRAPHY (ERCP) WITH PROPOFOL;  Surgeon: Lucilla Lame, MD;  Location: [facility] ENDOSCOPY;  Service: Endoscopy;  Laterality: N/A;  . HERNIA REPAIR     x 3  . PROSTATECTOMY      FAMILY HISTORY: Family History  Problem Relation Age of Onset  . Prostate cancer Brother   . Bladder Cancer Brother   . Pancreatic cancer Brother   . Prostate cancer Brother   . Lymphoma Mother   . Leukemia Father   . Breast cancer Sister   . Uterine cancer Sister   . Kidney cancer Neg Hx     ADVANCED DIRECTIVES (Y/N):  N  HEALTH MAINTENANCE: Social History   Tobacco Use  . Smoking status: Former Smoker    Last attempt to quit: 03/15/1967    Years since quitting: 50.5  . Smokeless tobacco: Former Systems developer    Types: Chew  . Tobacco comment: quit long time  Substance Use Topics  . Alcohol use: No    Alcohol/week: 0.0 oz  . Drug use: No     Colonoscopy:  PAP:  Bone density:  Lipid panel:  Allergies  Allergen Reactions  . Sulfa Antibiotics Nausea And Vomiting    Current Outpatient Medications  Medication Sig Dispense Refill  . Cholecalciferol (VITAMIN D3) 2000 units capsule Take 2,000 Units by mouth daily.     . hydrOXYzine (VISTARIL) 25 MG capsule Take 12.5 mg by mouth 3 (three) times daily as needed for itching.    Marland Kitchen LEVEMIR FLEXTOUCH 100 UNIT/ML Pen Inject 15 Units into the skin daily at 10 pm. 15 mL   . ondansetron (ZOFRAN) 4 MG  tablet Take 4 mg by mouth every 8 (eight) hours as needed for nausea or vomiting.    . pantoprazole (PROTONIX) 40 MG tablet TAKE 1 TABLET TWICE A DAY    . protein supplement shake (PREMIER PROTEIN) LIQD Take 325 mLs (11 oz total) by mouth 2 (two) times daily between meals. 30 Can 0  . traZODone (DESYREL) 50 MG tablet Take 50 mg by mouth at bedtime.     . vitamin B-12 (CYANOCOBALAMIN) 1000 MCG tablet Take 1,000 mcg by mouth daily.     Marland Kitchen ALPRAZolam (XANAX) 0.5 MG tablet Take 0.5 mg by mouth at bedtime.     Marland Kitchen doxazosin  (CARDURA) 2 MG tablet Take 2 mg by mouth daily.     . DULoxetine (CYMBALTA) 20 MG capsule Take 1 capsule by mouth daily.    . Multiple Vitamin (MULTI-VITAMINS) TABS Take 1 tablet by mouth daily.     . sertraline (ZOLOFT) 50 MG tablet Take 1 tablet by mouth daily.     No current facility-administered medications for this visit.     OBJECTIVE: Vitals:   10/05/17 1150  BP: (!) 141/70  Pulse: 76  Resp: 18  Temp: 98.1 F (36.7 C)     Body mass index is 27.4 kg/m.    ECOG FS:0 - Asymptomatic  General: Well-developed, well-nourished, no acute distress. Eyes: Pink conjunctiva, anicteric sclera. HEENT: Normocephalic, moist mucous membranes, clear oropharnyx. Lungs: Clear to auscultation bilaterally. Heart: Regular rate and rhythm. No rubs, murmurs, or gallops. Abdomen: Soft, nontender, nondistended. No organomegaly noted, normoactive bowel sounds. Musculoskeletal: No edema, cyanosis, or clubbing. Neuro: Alert, answering all questions appropriately. Cranial nerves grossly intact. Skin: No rashes or petechiae noted. Psych: Normal affect. Lymphatics: No cervical, calvicular, axillary or inguinal LAD.   LAB RESULTS:  Lab Results  Component Value Date   NA 134 (L) 09/28/2017   K 3.4 (L) 09/28/2017   CL 101 09/28/2017   CO2 25 09/28/2017   GLUCOSE 172 (H) 09/28/2017   BUN 14 09/28/2017   CREATININE 0.58 (L) 09/28/2017   CALCIUM 8.4 (L) 09/28/2017   PROT 5.6 (L) 09/28/2017   ALBUMIN 2.6 (L) 09/28/2017   AST 180 (H) 09/28/2017   ALT 477 (H) 09/28/2017   ALKPHOS 486 (H) 09/28/2017   BILITOT 6.4 (H) 09/28/2017   GFRNONAA >60 09/28/2017   GFRAA >60 09/28/2017    Lab Results  Component Value Date   WBC 4.5 09/26/2017   NEUTROABS 3.2 09/25/2017   HGB 13.7 09/26/2017   HCT 39.0 (L) 09/26/2017   MCV 101.7 (H) 09/26/2017   PLT 204 09/26/2017     STUDIES: Ct Abdomen Pelvis W Contrast  Result Date: 09/25/2017 CLINICAL DATA:  Jaundice.  Biliary obstruction. EXAM: CT ABDOMEN  AND PELVIS WITH CONTRAST TECHNIQUE: Multidetector CT imaging of the abdomen and pelvis was performed using the standard protocol following bolus administration of intravenous contrast. CONTRAST:  145mL ISOVUE-300 IOPAMIDOL (ISOVUE-300) INJECTION 61% COMPARISON:  08/05/2017.  And 10/16/2016 FINDINGS: Lower chest: No pleural or pericardial effusion. The lung bases appear clear. Calcifications within the LAD coronary artery identified. Aortic atherosclerosis noted. Hepatobiliary: Along the dome of the liver within the medial segment of left lobe there is a 1.6 cm ill-defined low-attenuation lesion measuring 1.5 cm, image 16 of series 2. A second lesion is identified within the medial right lobe of liver measuring 2.6 x 1.3 cm. These are not significantly changed when compared with 10/16/16. Status post cholecystectomy. There is moderate to marked intrahepatic biliary dilatation. The common bile  duct is increased in caliber measuring 1.2 cm. Pancreas: There is abrupt cut off of the common bile duct at the level of the head of pancreas where there is a focal area of enhancement measuring 1.8 cm, image 36 of series 2 and image 40 of series 5. main duct dilatation measuring up to 5 mm is identified, image 28 of series 2. There is mild atrophy of the body and tail of pancreas. Spleen: Normal in size without focal abnormality. Adrenals/Urinary Tract: The adrenal glands are normal. Normal adrenal glands. Small bilateral kidney lesions are too small to reliably characterize. No hydronephrosis is identified. Urinary bladder appears within normal limits. Stomach/Bowel: No pathologic dilatation of the small or large bowel loops. The appendix is visualized and appears normal. Distal colonic diverticula noted without acute inflammation. Vascular/Lymphatic: Aortic atherosclerosis. No aneurysm. No upper abdominal adenopathy. No pelvic or inguinal adenopathy. Reproductive: Previous prostatectomy. Other: No free fluid or fluid  collections. No peritoneal nodularity identified. Musculoskeletal: Probable bone island noted within the left iliac bone. No suspicious bone lesions identified. Age-indeterminate T10 compression deformity is new from 10/16/2016. Unchanged T12 compression fracture. Moderate to advanced degenerative disc disease is noted throughout the lumbar spine. IMPRESSION: 1. Interval development of moderate to marked intrahepatic biliary dilatation with increase caliber of the common bile duct, new from 08/05/2017. There is an abrupt cut off of the common bile duct at the level of the head of pancreas. A suspicious area of enhancement at the level of the cut off is identified. Findings are suspicious for lesion within head of pancreas. Further evaluation with contrast enhanced pancreas protocol is advised. 2. There are 2 ill-defined lesions within the liver. These are indeterminate but are unchanged when compared with 10/16/2016. These can may be further characterized with MRI. 3.  Aortic Atherosclerosis (ICD10-I70.0). 4. T10 and T12 compression fractures. Electronically Signed   By: Kerby Moors M.D.   On: 09/25/2017 19:58   Mr 3d Recon At Scanner  Result Date: 09/26/2017 CLINICAL DATA:  84 year old male with history of painless obstructive jaundice. Lack of appetite. 8-10 pound weight loss over the past several weeks. Abdominal discomfort. EXAM: MRI ABDOMEN WITHOUT AND WITH CONTRAST (INCLUDING MRCP) TECHNIQUE: Multiplanar multisequence MR imaging of the abdomen was performed both before and after the administration of intravenous contrast. Heavily T2-weighted images of the biliary and pancreatic ducts were obtained, and three-dimensional MRCP images were rendered by post processing. CONTRAST:  21mL MULTIHANCE GADOBENATE DIMEGLUMINE 529 MG/ML IV SOLN COMPARISON:  No priors. FINDINGS: Lower chest: Unremarkable. Hepatobiliary: 1.4 x 0.9 cm T1 hypointense, T2 hyperintense, nonenhancing lesion in the central aspect of segment  8 of the liver is compatible with a small cyst. Additionally, in the central aspect of segments 7 and 8 there is a 2.5 x 1.5 cm T1 hypointense, T2 hyperintense, nonenhancing lesion (axial image 44 of series 20), compatible with a small cyst. No other suspicious hepatic lesions are noted. Status post cholecystectomy. MRCP images demonstrate moderate intra and extrahepatic biliary ductal dilatation. Notably, the common bile duct abruptly terminates as it approaches the pancreatic head, with an appearance suggestive of compression from an extrinsic source. No intraductal lesion identified to suggest retained choledocholithiasis. Pancreas: There is a multilocular mixed cystic and solid lesion in the head of the pancreas which is poorly defined on today's examination, but coincides with the area of extrinsic mass effect upon the common bile duct, highly concerning for primary pancreatic neoplasm. The area of apparent involvement is estimated to measure approximately 2.6 x  3.6 cm (axial image 23 of series 14), as demonstrated by multiple small T1 hypointense, T2 hyperintense, nonenhancing cysts, as well as some amorphous soft tissue stroma between the cysts which demonstrates heterogeneous low level enhancement. This is also associated with diffuse pancreatic ductal dilatation measuring up to 6 mm in the body of the pancreas. No peripancreatic fluid or inflammatory changes. Spleen:  Unremarkable. Adrenals/Urinary Tract: Multiple T1 hypointense, T2 hyperintense, nonenhancing lesions are noted in both kidneys, compatible with multiple simple cysts, all of which are subcentimeter in size. No hydroureteronephrosis in the visualized portions of the abdomen. Bilateral adrenal glands are normal in appearance. Stomach/Bowel: Visualized portions are unremarkable. Vascular/Lymphatic: No aneurysm identified in the visualized abdominal vasculature. No lymphadenopathy noted in the visualized portions of the abdomen. Other:  No  significant volume portions of the peritoneal cavity. Musculoskeletal: No aggressive appearing osseous lesions are noted in the visualized portions of the skeleton. IMPRESSION: 1. 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 Further evaluation with endoscopy for endoscopic ultrasound and biopsy is in the near future to establish a tissue diagnosis. 2. 2 small lesions in the liver are compatible with cysts. Electronically Signed   By: Vinnie Langton M.D.   On: 09/26/2017 09:36   Dg C-arm 1-60 Min-no Report  Result Date: 09/27/2017 Fluoroscopy was utilized by the requesting physician.  No radiographic interpretation.   Mr Abdomen Mrcp Moise Boring Contast  Result Date: 09/26/2017 CLINICAL DATA:  84 year old male with history of painless obstructive jaundice. Lack of appetite. 8-10 pound weight loss over the past several weeks. Abdominal discomfort. EXAM: MRI ABDOMEN WITHOUT AND WITH CONTRAST (INCLUDING MRCP) TECHNIQUE: Multiplanar multisequence MR imaging of the abdomen was performed both before and after the administration of intravenous contrast. Heavily T2-weighted images of the biliary and pancreatic ducts were obtained, and three-dimensional MRCP images were rendered by post processing. CONTRAST:  24mL MULTIHANCE GADOBENATE DIMEGLUMINE 529 MG/ML IV SOLN COMPARISON:  No priors. FINDINGS: Lower chest: Unremarkable. Hepatobiliary: 1.4 x 0.9 cm T1 hypointense, T2 hyperintense, nonenhancing lesion in the central aspect of segment 8 of the liver is compatible with a small cyst. Additionally, in the central aspect of segments 7 and 8 there is a 2.5 x 1.5 cm T1 hypointense, T2 hyperintense, nonenhancing lesion (axial image 44 of series 20), compatible with a small cyst. No other suspicious hepatic lesions are noted. Status post cholecystectomy. MRCP images demonstrate moderate  intra and extrahepatic biliary ductal dilatation. Notably, the common bile duct abruptly terminates as it approaches the pancreatic head, with an appearance suggestive of compression from an extrinsic source. No intraductal lesion identified to suggest retained choledocholithiasis. Pancreas: There is a multilocular mixed cystic and solid lesion in the head of the pancreas which is poorly defined on today's examination, but coincides with the area of extrinsic mass effect upon the common bile duct, highly concerning for primary pancreatic neoplasm. The area of apparent involvement is estimated to measure approximately 2.6 x 3.6 cm (axial image 23 of series 14), as demonstrated by multiple small T1 hypointense, T2 hyperintense, nonenhancing cysts, as well as some amorphous soft tissue stroma between the cysts which demonstrates heterogeneous low level enhancement. This is also associated with diffuse pancreatic ductal dilatation measuring up to 6 mm in the body of the pancreas. No peripancreatic fluid or inflammatory changes. Spleen:  Unremarkable. Adrenals/Urinary Tract: Multiple T1 hypointense, T2 hyperintense,  nonenhancing lesions are noted in both kidneys, compatible with multiple simple cysts, all of which are subcentimeter in size. No hydroureteronephrosis in the visualized portions of the abdomen. Bilateral adrenal glands are normal in appearance. Stomach/Bowel: Visualized portions are unremarkable. Vascular/Lymphatic: No aneurysm identified in the visualized abdominal vasculature. No lymphadenopathy noted in the visualized portions of the abdomen. Other:  No significant volume portions of the peritoneal cavity. Musculoskeletal: No aggressive appearing osseous lesions are noted in the visualized portions of the skeleton. IMPRESSION: 1. There is obstruction of both the common bile duct and the pancreatic duct at the level of the pancreatic head, apparently from a mixed cystic and solid neoplasm in the pancreatic  head. Further evaluation with endoscopy for endoscopic ultrasound and biopsy is in the near future to establish a tissue diagnosis. 2. 2 small lesions in the liver are compatible with cysts. Electronically Signed   By: Vinnie Langton M.D.   On: 09/26/2017 09:36   US Abdomen Limited Ruq  Result Date: 09/25/2017 CLINICAL DATA:  Hepatitis.  Elevated LFTs. EXAM: ULTRASOUND ABDOMEN LIMITED RIGHT UPPER QUADRANT COMPARISON:  CT abdomen and pelvis dated 08/05/2017. FINDINGS: Gallbladder: Surgically absent. Common bile duct: Diameter: New dilatation up to 1.1 cm, previously 6 mm on prior CT. Liver: No focal lesion identified. New mild intrahepatic biliary dilatation. Within normal limits in parenchymal echogenicity. Portal vein is patent on color Doppler imaging with normal direction of blood flow towards the liver. IMPRESSION: 1. New extra- and intrahepatic biliary dilatation when compared to prior CT. Further evaluation with ERCP or MRCP (if the patient can tolerate breath holds) is recommended. Electronically Signed   By: Titus Dubin M.D.   On: 09/25/2017 17:11    ASSESSMENT: Pancreatic mass  PLAN:    1. Pancreatic mass: MRI and MRCP results reviewed independently and reported as above, highly suspicious for underlying malignancy. Ca 19-9 is also elevated at 386.  Patient will require biopsy with EUS and has agreed to go to [facility] for further evaluation and have the procedure.  He will also require additional imaging with PET scan if malignancy is confirmed.  Return to clinic one week after his biopsy to discuss the results as well as for additional diagnostic and treatment planning. 2. Nausea: Continue Zofran as prescribed. 3. Constipation: Patient was given a prescription for lactulose today.  Approximately 60 minutes was spent in discussion of which great than 50% was consultation.  Patient expressed understanding and was in agreement with this plan. He also understands that He can call  clinic at any time with any questions, concerns, or complaints.   Cancer Staging No matching staging information was found for the patient.  Lloyd Huger, MD   10/05/2017 12:26 PM

## 2017-10-05 ENCOUNTER — Encounter: Payer: Self-pay | Admitting: Oncology

## 2017-10-05 ENCOUNTER — Other Ambulatory Visit: Payer: Self-pay | Admitting: *Deleted

## 2017-10-05 ENCOUNTER — Inpatient Hospital Stay: Payer: Medicare Other | Attending: Oncology | Admitting: Oncology

## 2017-10-05 VITALS — BP 141/70 | HR 76 | Temp 98.1°F | Resp 18 | Wt 149.8 lb

## 2017-10-05 DIAGNOSIS — Z8042 Family history of malignant neoplasm of prostate: Secondary | ICD-10-CM | POA: Insufficient documentation

## 2017-10-05 DIAGNOSIS — R32 Unspecified urinary incontinence: Secondary | ICD-10-CM | POA: Diagnosis not present

## 2017-10-05 DIAGNOSIS — R110 Nausea: Secondary | ICD-10-CM | POA: Insufficient documentation

## 2017-10-05 DIAGNOSIS — K5900 Constipation, unspecified: Secondary | ICD-10-CM | POA: Insufficient documentation

## 2017-10-05 DIAGNOSIS — M4854XA Collapsed vertebra, not elsewhere classified, thoracic region, initial encounter for fracture: Secondary | ICD-10-CM | POA: Diagnosis not present

## 2017-10-05 DIAGNOSIS — Z803 Family history of malignant neoplasm of breast: Secondary | ICD-10-CM | POA: Insufficient documentation

## 2017-10-05 DIAGNOSIS — R351 Nocturia: Secondary | ICD-10-CM | POA: Insufficient documentation

## 2017-10-05 DIAGNOSIS — N3281 Overactive bladder: Secondary | ICD-10-CM | POA: Diagnosis not present

## 2017-10-05 DIAGNOSIS — Z8546 Personal history of malignant neoplasm of prostate: Secondary | ICD-10-CM | POA: Diagnosis not present

## 2017-10-05 DIAGNOSIS — I700 Atherosclerosis of aorta: Secondary | ICD-10-CM | POA: Insufficient documentation

## 2017-10-05 DIAGNOSIS — R7989 Other specified abnormal findings of blood chemistry: Secondary | ICD-10-CM | POA: Insufficient documentation

## 2017-10-05 DIAGNOSIS — M5136 Other intervertebral disc degeneration, lumbar region: Secondary | ICD-10-CM | POA: Diagnosis not present

## 2017-10-05 DIAGNOSIS — Z806 Family history of leukemia: Secondary | ICD-10-CM | POA: Diagnosis not present

## 2017-10-05 DIAGNOSIS — Z800 Family history of malignant neoplasm of digestive organs: Secondary | ICD-10-CM | POA: Insufficient documentation

## 2017-10-05 DIAGNOSIS — Z79899 Other long term (current) drug therapy: Secondary | ICD-10-CM | POA: Diagnosis not present

## 2017-10-05 DIAGNOSIS — K869 Disease of pancreas, unspecified: Secondary | ICD-10-CM | POA: Insufficient documentation

## 2017-10-05 DIAGNOSIS — K831 Obstruction of bile duct: Secondary | ICD-10-CM | POA: Diagnosis not present

## 2017-10-05 DIAGNOSIS — E119 Type 2 diabetes mellitus without complications: Secondary | ICD-10-CM | POA: Diagnosis not present

## 2017-10-05 DIAGNOSIS — Z9049 Acquired absence of other specified parts of digestive tract: Secondary | ICD-10-CM | POA: Diagnosis not present

## 2017-10-05 DIAGNOSIS — K8689 Other specified diseases of pancreas: Secondary | ICD-10-CM

## 2017-10-05 DIAGNOSIS — Z87891 Personal history of nicotine dependence: Secondary | ICD-10-CM | POA: Insufficient documentation

## 2017-10-05 DIAGNOSIS — R634 Abnormal weight loss: Secondary | ICD-10-CM | POA: Insufficient documentation

## 2017-10-05 MED ORDER — LACTULOSE 10 GM/15ML PO SOLN: 10.0000 g | Freq: Three times a day (TID) | ORAL | 1 refills | Status: DC

## 2017-10-05 NOTE — Progress Notes (Signed)
Met with Gerald Alvarez and his family along with Dr. Grayland Ormond during consult. Introduced Therapist, nutritional and provided contact information for any future needs. No availability for [facility] at [facility] until 2/21. Referral being sent to [facility] to expedite biopsy. They will contact either daughter for date/time/instructions. Denies taking any blood thinners. Oncology Nurse Navigator Documentation  Navigator Location: [facility] (10/05/17 1300)   )Navigator Encounter Type: Initial MedOnc (10/05/17 1300)                 Multidisiplinary Clinic Type: GI (10/05/17 1300)   Patient Visit Type: MedOnc;Initial (10/05/17 1300) Treatment Phase: Abnormal Scans (10/05/17 1300) Barriers/Navigation Needs: Coordination of Care (10/05/17 1300)   Interventions: Coordination of Care (10/05/17 1300)   Coordination of Care: EUS (10/05/17 1300)        Acuity: Level 2 (10/05/17 1300)   Acuity Level 2: Initial guidance, education and coordination as needed;Educational needs;Assistance expediting appointments;Ongoing guidance and education throughout treatment as needed (10/05/17 1300)     Time Spent with Patient: 30 (10/05/17 1300)

## 2017-10-06 ENCOUNTER — Telehealth: Payer: Self-pay | Admitting: *Deleted

## 2017-10-06 NOTE — Telephone Encounter (Signed)
Spoke with pts wife and she is concerned that pt has not had a bowel movement with lactulose prescribed yesterday. Pt advised to continue lactulose today and call back in the next 1-2 days if pt continues to feel constipated. Pts wife is concerned he needs an enema, states pt is in pain due to constipation. Please advise.

## 2017-10-10 NOTE — Progress Notes (Signed)
[place]  Telephone:([phone] [phone]  ID: Gerald Alvarez. OB: [birth year]  MR#: [id number]  UUV#:[id number]  Patient Care Team: Rusty Aus, MD as PCP - General (Internal Medicine) Clent Jacks, RN as Registered Nurse   CHIEF COMPLAINT: Stage IIa pancreatic adenocarcinoma.  INTERVAL HISTORY: Patient returns to clinic today for discussion of his pathology results and treatment planning.  He continues to have occasional nausea, but denies any pain.  He has a fair appetite.  He has no neurologic complaints.  He denies any recent fevers or illnesses.  He has no chest pain or shortness of breath.  He denies any vomiting or diarrhea.  He has no melena or hematochezia.  He has no urinary complaints.  Patient otherwise feels well and offers no further specific complaints.  REVIEW OF SYSTEMS:   Review of Systems  Constitutional: Negative.  Negative for fever, malaise/fatigue and weight loss.  Respiratory: Negative.  Negative for cough and shortness of breath.   Cardiovascular: Negative.  Negative for chest pain and leg swelling.  Gastrointestinal: Positive for constipation and nausea. Negative for abdominal pain, blood in stool, diarrhea and melena.  Genitourinary: Negative.  Negative for dysuria.  Musculoskeletal: Negative.   Skin: Negative.  Negative for rash.  Neurological: Negative for weakness.  Psychiatric/Behavioral: Negative.  The patient is not nervous/anxious.     As per HPI. Otherwise, a complete review of systems is negative.  PAST MEDICAL HISTORY: Past Medical History:  Diagnosis Date  . BPH (benign prostatic hyperplasia)   . Diabetes mellitus without complication ([employer])   . Incontinence   . Nocturia   . OAB (overactive bladder)   . Prostate cancer ([place])     PAST SURGICAL HISTORY: Past Surgical History:  Procedure Laterality Date  . CHOLECYSTECTOMY    . ENDOSCOPIC RETROGRADE CHOLANGIOPANCREATOGRAPHY (ERCP) WITH PROPOFOL  N/A 09/27/2017   Procedure: ENDOSCOPIC RETROGRADE CHOLANGIOPANCREATOGRAPHY (ERCP) WITH PROPOFOL;  Surgeon: Lucilla Lame, MD;  Location: [facility] ENDOSCOPY;  Service: Endoscopy;  Laterality: N/A;  . HERNIA REPAIR     x 3  . PROSTATECTOMY      FAMILY HISTORY: Family History  Problem Relation Age of Onset  . Prostate cancer Brother   . Bladder Cancer Brother   . Pancreatic cancer Brother   . Prostate cancer Brother   . Lymphoma Mother   . Leukemia Father   . Breast cancer Sister   . Uterine cancer Sister   . Kidney cancer Neg Hx     ADVANCED DIRECTIVES (Y/N):  N  HEALTH MAINTENANCE: Social History   Tobacco Use  . Smoking status: Former Smoker    Last attempt to quit: 03/15/1967    Years since quitting: 50.6  . Smokeless tobacco: Former Systems developer    Types: Chew  . Tobacco comment: quit long time  Substance Use Topics  . Alcohol use: No    Alcohol/week: 0.0 oz  . Drug use: No     Colonoscopy:  PAP:  Bone density:  Lipid panel:  Allergies  Allergen Reactions  . Sulfa Antibiotics Nausea And Vomiting    Current Outpatient Medications  Medication Sig Dispense Refill  . ALPRAZolam (XANAX) 0.5 MG tablet Take 0.5 mg by mouth at bedtime.     . Cholecalciferol (VITAMIN D3) 2000 units capsule Take 2,000 Units by mouth daily.     Marland Kitchen doxazosin (CARDURA) 2 MG tablet Take 2 mg by mouth daily.     . DULoxetine (CYMBALTA) 20 MG capsule Take 1 capsule by mouth  daily.    . hydrOXYzine (VISTARIL) 25 MG capsule Take 12.5 mg by mouth 3 (three) times daily as needed for itching.    . lactulose (CHRONULAC) 10 GM/15ML solution Take by mouth.    Marland Kitchen LEVEMIR FLEXTOUCH 100 UNIT/ML Pen Inject 15 Units into the skin daily at 10 pm. 15 mL   . Multiple Vitamin (MULTI-VITAMINS) TABS Take 1 tablet by mouth daily.     . ondansetron (ZOFRAN) 4 MG tablet Take 4 mg by mouth every 8 (eight) hours as needed for nausea or vomiting.    . pantoprazole (PROTONIX) 40 MG tablet TAKE 1 TABLET TWICE A DAY    . protein  supplement shake (PREMIER PROTEIN) LIQD Take 325 mLs (11 oz total) by mouth 2 (two) times daily between meals. 30 Can 0  . sertraline (ZOLOFT) 50 MG tablet Take 1 tablet by mouth daily.    . traZODone (DESYREL) 50 MG tablet Take 50 mg by mouth at bedtime.     . vitamin B-12 (CYANOCOBALAMIN) 1000 MCG tablet Take 1,000 mcg by mouth daily.      No current facility-administered medications for this visit.     OBJECTIVE: Vitals:   10/13/17 0958  BP: 118/68  Pulse: 99  Temp: 98.2 F (36.8 C)     Body mass index is 26.9 kg/m.    ECOG FS:0 - Asymptomatic  General: Well-developed, well-nourished, no acute distress. Eyes: Pink conjunctiva, anicteric sclera. Lungs: Clear to auscultation bilaterally. Heart: Regular rate and rhythm. No rubs, murmurs, or gallops. Abdomen: Soft, nontender, nondistended. No organomegaly noted, normoactive bowel sounds. Musculoskeletal: No edema, cyanosis, or clubbing. Neuro: Alert, answering all questions appropriately. Cranial nerves grossly intact. Skin: No rashes or petechiae noted. Psych: Normal affect.   LAB RESULTS:  Lab Results  Component Value Date   NA 134 (L) 09/28/2017   K 3.4 (L) 09/28/2017   CL 101 09/28/2017   CO2 25 09/28/2017   GLUCOSE 172 (H) 09/28/2017   BUN 14 09/28/2017   CREATININE 0.58 (L) 09/28/2017   CALCIUM 8.4 (L) 09/28/2017   PROT 5.6 (L) 09/28/2017   ALBUMIN 2.6 (L) 09/28/2017   AST 180 (H) 09/28/2017   ALT 477 (H) 09/28/2017   ALKPHOS 486 (H) 09/28/2017   BILITOT 6.4 (H) 09/28/2017   GFRNONAA >60 09/28/2017   GFRAA >60 09/28/2017    Lab Results  Component Value Date   WBC 4.5 09/26/2017   NEUTROABS 3.2 09/25/2017   HGB 13.7 09/26/2017   HCT 39.0 (L) 09/26/2017   MCV 101.7 (H) 09/26/2017   PLT 204 09/26/2017     STUDIES: Ct Abdomen Pelvis W Contrast  Result Date: 09/25/2017 CLINICAL DATA:  Jaundice.  Biliary obstruction. EXAM: CT ABDOMEN AND PELVIS WITH CONTRAST TECHNIQUE: Multidetector CT imaging of the  abdomen and pelvis was performed using the standard protocol following bolus administration of intravenous contrast. CONTRAST:  165mL ISOVUE-300 IOPAMIDOL (ISOVUE-300) INJECTION 61% COMPARISON:  08/05/2017.  And 10/16/2016 FINDINGS: Lower chest: No pleural or pericardial effusion. The lung bases appear clear. Calcifications within the LAD coronary artery identified. Aortic atherosclerosis noted. Hepatobiliary: Along the dome of the liver within the medial segment of left lobe there is a 1.6 cm ill-defined low-attenuation lesion measuring 1.5 cm, image 16 of series 2. A second lesion is identified within the medial right lobe of liver measuring 2.6 x 1.3 cm. These are not significantly changed when compared with 10/16/16. Status post cholecystectomy. There is moderate to marked intrahepatic biliary dilatation. The common bile duct is increased in  caliber measuring 1.2 cm. Pancreas: There is abrupt cut off of the common bile duct at the level of the head of pancreas where there is a focal area of enhancement measuring 1.8 cm, image 36 of series 2 and image 40 of series 5. main duct dilatation measuring up to 5 mm is identified, image 28 of series 2. There is mild atrophy of the body and tail of pancreas. Spleen: Normal in size without focal abnormality. Adrenals/Urinary Tract: The adrenal glands are normal. Normal adrenal glands. Small bilateral kidney lesions are too small to reliably characterize. No hydronephrosis is identified. Urinary bladder appears within normal limits. Stomach/Bowel: No pathologic dilatation of the small or large bowel loops. The appendix is visualized and appears normal. Distal colonic diverticula noted without acute inflammation. Vascular/Lymphatic: Aortic atherosclerosis. No aneurysm. No upper abdominal adenopathy. No pelvic or inguinal adenopathy. Reproductive: Previous prostatectomy. Other: No free fluid or fluid collections. No peritoneal nodularity identified. Musculoskeletal: Probable  bone island noted within the left iliac bone. No suspicious bone lesions identified. Age-indeterminate T10 compression deformity is new from 10/16/2016. Unchanged T12 compression fracture. Moderate to advanced degenerative disc disease is noted throughout the lumbar spine. IMPRESSION: 1. Interval development of moderate to marked intrahepatic biliary dilatation with increase caliber of the common bile duct, new from 08/05/2017. There is an abrupt cut off of the common bile duct at the level of the head of pancreas. A suspicious area of enhancement at the level of the cut off is identified. Findings are suspicious for lesion within head of pancreas. Further evaluation with contrast enhanced pancreas protocol is advised. 2. There are 2 ill-defined lesions within the liver. These are indeterminate but are unchanged when compared with 10/16/2016. These can may be further characterized with MRI. 3.  Aortic Atherosclerosis (ICD10-I70.0). 4. T10 and T12 compression fractures. Electronically Signed   By: Kerby Moors M.D.   On: 09/25/2017 19:58   Mr 3d Recon At Scanner  Result Date: 09/26/2017 CLINICAL DATA:  84 year old male with history of painless obstructive jaundice. Lack of appetite. 8-10 pound weight loss over the past several weeks. Abdominal discomfort. EXAM: MRI ABDOMEN WITHOUT AND WITH CONTRAST (INCLUDING MRCP) TECHNIQUE: Multiplanar multisequence MR imaging of the abdomen was performed both before and after the administration of intravenous contrast. Heavily T2-weighted images of the biliary and pancreatic ducts were obtained, and three-dimensional MRCP images were rendered by post processing. CONTRAST:  88mL MULTIHANCE GADOBENATE DIMEGLUMINE 529 MG/ML IV SOLN COMPARISON:  No priors. FINDINGS: Lower chest: Unremarkable. Hepatobiliary: 1.4 x 0.9 cm T1 hypointense, T2 hyperintense, nonenhancing lesion in the central aspect of segment 8 of the liver is compatible with a small cyst. Additionally, in the  central aspect of segments 7 and 8 there is a 2.5 x 1.5 cm T1 hypointense, T2 hyperintense, nonenhancing lesion (axial image 44 of series 20), compatible with a small cyst. No other suspicious hepatic lesions are noted. Status post cholecystectomy. MRCP images demonstrate moderate intra and extrahepatic biliary ductal dilatation. Notably, the common bile duct abruptly terminates as it approaches the pancreatic head, with an appearance suggestive of compression from an extrinsic source. No intraductal lesion identified to suggest retained choledocholithiasis. Pancreas: There is a multilocular mixed cystic and solid lesion in the head of the pancreas which is poorly defined on today's examination, but coincides with the area of extrinsic mass effect upon the common bile duct, highly concerning for primary pancreatic neoplasm. The area of apparent involvement is estimated to measure approximately 2.6 x 3.6 cm (axial image  23 of series 14), as demonstrated by multiple small T1 hypointense, T2 hyperintense, nonenhancing cysts, as well as some amorphous soft tissue stroma between the cysts which demonstrates heterogeneous low level enhancement. This is also associated with diffuse pancreatic ductal dilatation measuring up to 6 mm in the body of the pancreas. No peripancreatic fluid or inflammatory changes. Spleen:  Unremarkable. Adrenals/Urinary Tract: Multiple T1 hypointense, T2 hyperintense, nonenhancing lesions are noted in both kidneys, compatible with multiple simple cysts, all of which are subcentimeter in size. No hydroureteronephrosis in the visualized portions of the abdomen. Bilateral adrenal glands are normal in appearance. Stomach/Bowel: Visualized portions are unremarkable. Vascular/Lymphatic: No aneurysm identified in the visualized abdominal vasculature. No lymphadenopathy noted in the visualized portions of the abdomen. Other:  No significant volume portions of the peritoneal cavity. Musculoskeletal: No  aggressive appearing osseous lesions are noted in the visualized portions of the skeleton. IMPRESSION: 1. 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 Further evaluation with endoscopy for endoscopic ultrasound and biopsy is in the near future to establish a tissue diagnosis. 2. 2 small lesions in the liver are compatible with cysts. Electronically Signed   By: Vinnie Langton M.D.   On: 09/26/2017 09:36   Dg C-arm 1-60 Min-no Report  Result Date: 09/27/2017 Fluoroscopy was utilized by the requesting physician.  No radiographic interpretation.   Mr Abdomen Mrcp Moise Boring Contast  Result Date: 09/26/2017 CLINICAL DATA:  84 year old male with history of painless obstructive jaundice. Lack of appetite. 8-10 pound weight loss over the past several weeks. Abdominal discomfort. EXAM: MRI ABDOMEN WITHOUT AND WITH CONTRAST (INCLUDING MRCP) TECHNIQUE: Multiplanar multisequence MR imaging of the abdomen was performed both before and after the administration of intravenous contrast. Heavily T2-weighted images of the biliary and pancreatic ducts were obtained, and three-dimensional MRCP images were rendered by post processing. CONTRAST:  63mL MULTIHANCE GADOBENATE DIMEGLUMINE 529 MG/ML IV SOLN COMPARISON:  No priors. FINDINGS: Lower chest: Unremarkable. Hepatobiliary: 1.4 x 0.9 cm T1 hypointense, T2 hyperintense, nonenhancing lesion in the central aspect of segment 8 of the liver is compatible with a small cyst. Additionally, in the central aspect of segments 7 and 8 there is a 2.5 x 1.5 cm T1 hypointense, T2 hyperintense, nonenhancing lesion (axial image 44 of series 20), compatible with a small cyst. No other suspicious hepatic lesions are noted. Status post cholecystectomy. MRCP images demonstrate moderate intra and extrahepatic biliary ductal dilatation. Notably, the common bile  duct abruptly terminates as it approaches the pancreatic head, with an appearance suggestive of compression from an extrinsic source. No intraductal lesion identified to suggest retained choledocholithiasis. Pancreas: There is a multilocular mixed cystic and solid lesion in the head of the pancreas which is poorly defined on today's examination, but coincides with the area of extrinsic mass effect upon the common bile duct, highly concerning for primary pancreatic neoplasm. The area of apparent involvement is estimated to measure approximately 2.6 x 3.6 cm (axial image 23 of series 14), as demonstrated by multiple small T1 hypointense, T2 hyperintense, nonenhancing cysts, as well as some amorphous soft tissue stroma between the cysts which demonstrates heterogeneous low level enhancement. This is also associated with diffuse pancreatic ductal dilatation measuring up to 6 mm in the body of the pancreas. No peripancreatic fluid or inflammatory changes. Spleen:  Unremarkable. Adrenals/Urinary Tract: Multiple T1 hypointense, T2 hyperintense, nonenhancing lesions are noted  in both kidneys, compatible with multiple simple cysts, all of which are subcentimeter in size. No hydroureteronephrosis in the visualized portions of the abdomen. Bilateral adrenal glands are normal in appearance. Stomach/Bowel: Visualized portions are unremarkable. Vascular/Lymphatic: No aneurysm identified in the visualized abdominal vasculature. No lymphadenopathy noted in the visualized portions of the abdomen. Other:  No significant volume portions of the peritoneal cavity. Musculoskeletal: No aggressive appearing osseous lesions are noted in the visualized portions of the skeleton. IMPRESSION: 1. 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 Further evaluation with endoscopy for endoscopic ultrasound and  biopsy is in the near future to establish a tissue diagnosis. 2. 2 small lesions in the liver are compatible with cysts. Electronically Signed   By: Vinnie Langton M.D.   On: 09/26/2017 09:36   US Abdomen Limited Ruq  Result Date: 09/25/2017 CLINICAL DATA:  Hepatitis.  Elevated LFTs. EXAM: ULTRASOUND ABDOMEN LIMITED RIGHT UPPER QUADRANT COMPARISON:  CT abdomen and pelvis dated 08/05/2017. FINDINGS: Gallbladder: Surgically absent. Common bile duct: Diameter: New dilatation up to 1.1 cm, previously 6 mm on prior CT. Liver: No focal lesion identified. New mild intrahepatic biliary dilatation. Within normal limits in parenchymal echogenicity. Portal vein is patent on color Doppler imaging with normal direction of blood flow towards the liver. IMPRESSION: 1. New extra- and intrahepatic biliary dilatation when compared to prior CT. Further evaluation with ERCP or MRCP (if the patient can tolerate breath holds) is recommended. Electronically Signed   By: Titus Dubin M.D.   On: 09/25/2017 17:11    ASSESSMENT: Stage IIa pancreatic adenocarcinoma  PLAN:    1.  Stage IIa pancreatic adenocarcinoma: MRI and MRCP results reviewed independently.  Patient recently had EUS at [facility] that confirmed adenocarcinoma.  His Ca 19-9 is also elevated at 386.  Patient is not a surgical candidate, but has agreed to pursue palliative chemotherapy using gemcitabine and Abraxane.  Will dose reduce gemcitabine to 800 mg/m given his advanced age.  He will require a PET scan and port placement prior to initiating treatment.  He will receive treatment on days 1, 8, and 15 with day 22 off.  Return to clinic on October 27, 2017 for further evaluation and consideration of cycle 1, day 1 of gemcitabine and Abraxane. 2.  Elevated liver enzymes: Secondary to malignancy.  Patient has appointment for stent replacement in the near future.  Monitor. 3. Nausea: Continue Zofran as prescribed. 3. Constipation: Patient does not  complain of this today.  Approximately 30 minutes was spent in discussion of which great than 50% was consultation.  Patient expressed understanding and was in agreement with this plan. He also understands that He can call clinic at any time with any questions, concerns, or complaints.   Cancer Staging Pancreatic adenocarcinoma [facility]) Staging form: Exocrine Pancreas, AJCC 8th Edition - Clinical stage from 10/16/2017: Stage IIA (cT3, cN0, cM0) - Signed by Lloyd Huger, MD on 10/16/2017   Lloyd Huger, MD   10/16/2017 3:45 PM

## 2017-10-13 ENCOUNTER — Encounter: Payer: Self-pay | Admitting: Oncology

## 2017-10-13 ENCOUNTER — Other Ambulatory Visit: Payer: Self-pay

## 2017-10-13 ENCOUNTER — Encounter (INDEPENDENT_AMBULATORY_CARE_PROVIDER_SITE_OTHER): Payer: Self-pay

## 2017-10-13 ENCOUNTER — Inpatient Hospital Stay: Payer: Medicare Other | Attending: Oncology | Admitting: Oncology

## 2017-10-13 VITALS — BP 118/68 | HR 99 | Temp 98.2°F | Wt 147.1 lb

## 2017-10-13 DIAGNOSIS — Z800 Family history of malignant neoplasm of digestive organs: Secondary | ICD-10-CM | POA: Insufficient documentation

## 2017-10-13 DIAGNOSIS — Z8052 Family history of malignant neoplasm of bladder: Secondary | ICD-10-CM | POA: Insufficient documentation

## 2017-10-13 DIAGNOSIS — Z7189 Other specified counseling: Secondary | ICD-10-CM

## 2017-10-13 DIAGNOSIS — E119 Type 2 diabetes mellitus without complications: Secondary | ICD-10-CM | POA: Diagnosis not present

## 2017-10-13 DIAGNOSIS — Z5111 Encounter for antineoplastic chemotherapy: Secondary | ICD-10-CM | POA: Insufficient documentation

## 2017-10-13 DIAGNOSIS — Z79899 Other long term (current) drug therapy: Secondary | ICD-10-CM | POA: Insufficient documentation

## 2017-10-13 DIAGNOSIS — I6523 Occlusion and stenosis of bilateral carotid arteries: Secondary | ICD-10-CM | POA: Insufficient documentation

## 2017-10-13 DIAGNOSIS — K5900 Constipation, unspecified: Secondary | ICD-10-CM | POA: Diagnosis not present

## 2017-10-13 DIAGNOSIS — D136 Benign neoplasm of pancreas: Secondary | ICD-10-CM

## 2017-10-13 DIAGNOSIS — R110 Nausea: Secondary | ICD-10-CM | POA: Diagnosis not present

## 2017-10-13 DIAGNOSIS — C259 Malignant neoplasm of pancreas, unspecified: Secondary | ICD-10-CM | POA: Insufficient documentation

## 2017-10-13 DIAGNOSIS — M8580 Other specified disorders of bone density and structure, unspecified site: Secondary | ICD-10-CM | POA: Diagnosis not present

## 2017-10-13 DIAGNOSIS — Z87891 Personal history of nicotine dependence: Secondary | ICD-10-CM | POA: Diagnosis not present

## 2017-10-13 DIAGNOSIS — Z8042 Family history of malignant neoplasm of prostate: Secondary | ICD-10-CM | POA: Diagnosis not present

## 2017-10-13 DIAGNOSIS — Z806 Family history of leukemia: Secondary | ICD-10-CM | POA: Insufficient documentation

## 2017-10-13 DIAGNOSIS — I700 Atherosclerosis of aorta: Secondary | ICD-10-CM | POA: Diagnosis not present

## 2017-10-13 DIAGNOSIS — R948 Abnormal results of function studies of other organs and systems: Secondary | ICD-10-CM | POA: Diagnosis not present

## 2017-10-13 DIAGNOSIS — R21 Rash and other nonspecific skin eruption: Secondary | ICD-10-CM | POA: Diagnosis not present

## 2017-10-13 DIAGNOSIS — R32 Unspecified urinary incontinence: Secondary | ICD-10-CM | POA: Diagnosis not present

## 2017-10-13 DIAGNOSIS — N400 Enlarged prostate without lower urinary tract symptoms: Secondary | ICD-10-CM | POA: Diagnosis not present

## 2017-10-13 DIAGNOSIS — Z803 Family history of malignant neoplasm of breast: Secondary | ICD-10-CM | POA: Diagnosis not present

## 2017-10-13 NOTE — Progress Notes (Signed)
START ON PATHWAY REGIMEN - Pancreatic     A cycle is every 28 days:     Nab-paclitaxel (protein bound)      Gemcitabine   **Always confirm dose/schedule in your pharmacy ordering system**    Patient Characteristics: Adenocarcinoma, Locally Advanced, Anatomically Unresectable, First Line, PS = 0, 1 Histology: Adenocarcinoma Current evidence of distant metastases<= No AJCC T Category: T3 AJCC N Category: N0 AJCC M Category: M0 AJCC 8 Stage Grouping: IIA Line of Therapy: First Line Would you be surprised if this patient died  in the next year<= I would NOT be surprised if this patient died in the next year Intent of Therapy: Non-Curative / Palliative Intent, Discussed with Patient

## 2017-10-14 ENCOUNTER — Ambulatory Visit (INDEPENDENT_AMBULATORY_CARE_PROVIDER_SITE_OTHER): Payer: Medicare Other | Admitting: Gastroenterology

## 2017-10-14 ENCOUNTER — Other Ambulatory Visit: Payer: Self-pay

## 2017-10-14 ENCOUNTER — Encounter: Payer: Self-pay | Admitting: Gastroenterology

## 2017-10-14 VITALS — BP 140/67 | HR 67 | Ht 64.0 in | Wt 146.0 lb

## 2017-10-14 DIAGNOSIS — C250 Malignant neoplasm of head of pancreas: Secondary | ICD-10-CM

## 2017-10-14 DIAGNOSIS — K8689 Other specified diseases of pancreas: Secondary | ICD-10-CM

## 2017-10-14 NOTE — Progress Notes (Signed)
Primary Care Physician: Rusty Aus, MD  Primary Gastroenterologist:  Dr. Lucilla Lame  Chief Complaint  Patient presents with  . Hospitalization Follow-up    HPI: Gerald Roger. is a 84 y.o. male here for follow-up after having an ERCP for pancreatic head mass.  The patient had a EUS that showed him to have adenocarcinoma.  The patient is now set up with oncology for treatment for his pancreatic cancer.  The patient has a plastic stent that was placed on January 20th and his common bile duct.  The patient states his urine is no longer dark and his skin color has improved.  Current Outpatient Medications  Medication Sig Dispense Refill  . ALPRAZolam (XANAX) 0.5 MG tablet Take 0.5 mg by mouth at bedtime.     . Cholecalciferol (VITAMIN D3) 2000 units capsule Take 2,000 Units by mouth daily.     Marland Kitchen doxazosin (CARDURA) 2 MG tablet Take 2 mg by mouth daily.     . DULoxetine (CYMBALTA) 20 MG capsule Take 1 capsule by mouth daily.    . hydrOXYzine (VISTARIL) 25 MG capsule Take 12.5 mg by mouth 3 (three) times daily as needed for itching.    . lactulose (CHRONULAC) 10 GM/15ML solution Take by mouth.    Marland Kitchen LEVEMIR FLEXTOUCH 100 UNIT/ML Pen Inject 15 Units into the skin daily at 10 pm. 15 mL   . Multiple Vitamin (MULTI-VITAMINS) TABS Take 1 tablet by mouth daily.     . ondansetron (ZOFRAN) 4 MG tablet Take 4 mg by mouth every 8 (eight) hours as needed for nausea or vomiting.    . pantoprazole (PROTONIX) 40 MG tablet TAKE 1 TABLET TWICE A DAY    . protein supplement shake (PREMIER PROTEIN) LIQD Take 325 mLs (11 oz total) by mouth 2 (two) times daily between meals. 30 Can 0  . sertraline (ZOLOFT) 50 MG tablet Take 1 tablet by mouth daily.    . traZODone (DESYREL) 50 MG tablet Take 50 mg by mouth at bedtime.     . vitamin B-12 (CYANOCOBALAMIN) 1000 MCG tablet Take 1,000 mcg by mouth daily.      No current facility-administered medications for this visit.     Allergies as of 10/14/2017 -  Review Complete 10/14/2017  Allergen Reaction Noted  . Sulfa antibiotics Nausea And Vomiting 03/15/2015    ROS:  General: Negative for anorexia, weight loss, fever, chills, fatigue, weakness. ENT: Negative for hoarseness, difficulty swallowing , nasal congestion. CV: Negative for chest pain, angina, palpitations, dyspnea on exertion, peripheral edema.  Respiratory: Negative for dyspnea at rest, dyspnea on exertion, cough, sputum, wheezing.  GI: See history of present illness. GU:  Negative for dysuria, hematuria, urinary incontinence, urinary frequency, nocturnal urination.  Endo: Negative for unusual weight change.    Physical Examination:   BP 140/67   Pulse 67   Ht 5\' 4"  (1.626 m)   Wt 146 lb (66.2 kg)   BMI 25.06 kg/m   General: Well-nourished, well-developed in no acute distress.  Eyes: No icterus. Conjunctivae pink. Mouth: Oropharyngeal mucosa moist and pink , no lesions erythema or exudate. Lungs: Clear to auscultation bilaterally. Non-labored. Heart: Regular rate and rhythm, no murmurs rubs or gallops.  Abdomen: Bowel sounds are normal, nontender, nondistended, no hepatosplenomegaly or masses, no abdominal bruits or hernia , no rebound or guarding.   Extremities: No lower extremity edema. No clubbing or deformities. Neuro: Alert and oriented x 3.  Grossly intact. Skin: Warm and dry, no jaundice.  Psych: Alert and cooperative, normal mood and affect.  Labs:    Imaging Studies: Ct Abdomen Pelvis W Contrast  Result Date: 09/25/2017 CLINICAL DATA:  Jaundice.  Biliary obstruction. EXAM: CT ABDOMEN AND PELVIS WITH CONTRAST TECHNIQUE: Multidetector CT imaging of the abdomen and pelvis was performed using the standard protocol following bolus administration of intravenous contrast. CONTRAST:  128mL ISOVUE-300 IOPAMIDOL (ISOVUE-300) INJECTION 61% COMPARISON:  08/05/2017.  And 10/16/2016 FINDINGS: Lower chest: No pleural or pericardial effusion. The lung bases appear clear.  Calcifications within the LAD coronary artery identified. Aortic atherosclerosis noted. Hepatobiliary: Along the dome of the liver within the medial segment of left lobe there is a 1.6 cm ill-defined low-attenuation lesion measuring 1.5 cm, image 16 of series 2. A second lesion is identified within the medial right lobe of liver measuring 2.6 x 1.3 cm. These are not significantly changed when compared with 10/16/16. Status post cholecystectomy. There is moderate to marked intrahepatic biliary dilatation. The common bile duct is increased in caliber measuring 1.2 cm. Pancreas: There is abrupt cut off of the common bile duct at the level of the head of pancreas where there is a focal area of enhancement measuring 1.8 cm, image 36 of series 2 and image 40 of series 5. main duct dilatation measuring up to 5 mm is identified, image 28 of series 2. There is mild atrophy of the body and tail of pancreas. Spleen: Normal in size without focal abnormality. Adrenals/Urinary Tract: The adrenal glands are normal. Normal adrenal glands. Small bilateral kidney lesions are too small to reliably characterize. No hydronephrosis is identified. Urinary bladder appears within normal limits. Stomach/Bowel: No pathologic dilatation of the small or large bowel loops. The appendix is visualized and appears normal. Distal colonic diverticula noted without acute inflammation. Vascular/Lymphatic: Aortic atherosclerosis. No aneurysm. No upper abdominal adenopathy. No pelvic or inguinal adenopathy. Reproductive: Previous prostatectomy. Other: No free fluid or fluid collections. No peritoneal nodularity identified. Musculoskeletal: Probable bone island noted within the left iliac bone. No suspicious bone lesions identified. Age-indeterminate T10 compression deformity is new from 10/16/2016. Unchanged T12 compression fracture. Moderate to advanced degenerative disc disease is noted throughout the lumbar spine. IMPRESSION: 1. Interval development of  moderate to marked intrahepatic biliary dilatation with increase caliber of the common bile duct, new from 08/05/2017. There is an abrupt cut off of the common bile duct at the level of the head of pancreas. A suspicious area of enhancement at the level of the cut off is identified. Findings are suspicious for lesion within head of pancreas. Further evaluation with contrast enhanced pancreas protocol is advised. 2. There are 2 ill-defined lesions within the liver. These are indeterminate but are unchanged when compared with 10/16/2016. These can may be further characterized with MRI. 3.  Aortic Atherosclerosis (ICD10-I70.0). 4. T10 and T12 compression fractures. Electronically Signed   By: Kerby Moors M.D.   On: 09/25/2017 19:58   Mr 3d Recon At Scanner  Result Date: 09/26/2017 CLINICAL DATA:  84 year old male with history of painless obstructive jaundice. Lack of appetite. 8-10 pound weight loss over the past several weeks. Abdominal discomfort. EXAM: MRI ABDOMEN WITHOUT AND WITH CONTRAST (INCLUDING MRCP) TECHNIQUE: Multiplanar multisequence MR imaging of the abdomen was performed both before and after the administration of intravenous contrast. Heavily T2-weighted images of the biliary and pancreatic ducts were obtained, and three-dimensional MRCP images were rendered by post processing. CONTRAST:  45mL MULTIHANCE GADOBENATE DIMEGLUMINE 529 MG/ML IV SOLN COMPARISON:  No priors. FINDINGS: Lower chest: Unremarkable. Hepatobiliary:  1.4 x 0.9 cm T1 hypointense, T2 hyperintense, nonenhancing lesion in the central aspect of segment 8 of the liver is compatible with a small cyst. Additionally, in the central aspect of segments 7 and 8 there is a 2.5 x 1.5 cm T1 hypointense, T2 hyperintense, nonenhancing lesion (axial image 44 of series 20), compatible with a small cyst. No other suspicious hepatic lesions are noted. Status post cholecystectomy. MRCP images demonstrate moderate intra and extrahepatic biliary ductal  dilatation. Notably, the common bile duct abruptly terminates as it approaches the pancreatic head, with an appearance suggestive of compression from an extrinsic source. No intraductal lesion identified to suggest retained choledocholithiasis. Pancreas: There is a multilocular mixed cystic and solid lesion in the head of the pancreas which is poorly defined on today's examination, but coincides with the area of extrinsic mass effect upon the common bile duct, highly concerning for primary pancreatic neoplasm. The area of apparent involvement is estimated to measure approximately 2.6 x 3.6 cm (axial image 23 of series 14), as demonstrated by multiple small T1 hypointense, T2 hyperintense, nonenhancing cysts, as well as some amorphous soft tissue stroma between the cysts which demonstrates heterogeneous low level enhancement. This is also associated with diffuse pancreatic ductal dilatation measuring up to 6 mm in the body of the pancreas. No peripancreatic fluid or inflammatory changes. Spleen:  Unremarkable. Adrenals/Urinary Tract: Multiple T1 hypointense, T2 hyperintense, nonenhancing lesions are noted in both kidneys, compatible with multiple simple cysts, all of which are subcentimeter in size. No hydroureteronephrosis in the visualized portions of the abdomen. Bilateral adrenal glands are normal in appearance. Stomach/Bowel: Visualized portions are unremarkable. Vascular/Lymphatic: No aneurysm identified in the visualized abdominal vasculature. No lymphadenopathy noted in the visualized portions of the abdomen. Other:  No significant volume portions of the peritoneal cavity. Musculoskeletal: No aggressive appearing osseous lesions are noted in the visualized portions of the skeleton. IMPRESSION: 1. 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 Further evaluation with  endoscopy for endoscopic ultrasound and biopsy is in the near future to establish a tissue diagnosis. 2. 2 small lesions in the liver are compatible with cysts. Electronically Signed   By: Vinnie Langton M.D.   On: 09/26/2017 09:36   Dg C-arm 1-60 Min-no Report  Result Date: 09/27/2017 Fluoroscopy was utilized by the requesting physician.  No radiographic interpretation.   Mr Abdomen Mrcp Moise Boring Contast  Result Date: 09/26/2017 CLINICAL DATA:  84 year old male with history of painless obstructive jaundice. Lack of appetite. 8-10 pound weight loss over the past several weeks. Abdominal discomfort. EXAM: MRI ABDOMEN WITHOUT AND WITH CONTRAST (INCLUDING MRCP) TECHNIQUE: Multiplanar multisequence MR imaging of the abdomen was performed both before and after the administration of intravenous contrast. Heavily T2-weighted images of the biliary and pancreatic ducts were obtained, and three-dimensional MRCP images were rendered by post processing. CONTRAST:  73mL MULTIHANCE GADOBENATE DIMEGLUMINE 529 MG/ML IV SOLN COMPARISON:  No priors. FINDINGS: Lower chest: Unremarkable. Hepatobiliary: 1.4 x 0.9 cm T1 hypointense, T2 hyperintense, nonenhancing lesion in the central aspect of segment 8 of the liver is compatible with a small cyst. Additionally, in the central aspect of segments 7 and 8 there is a 2.5 x 1.5 cm T1 hypointense, T2 hyperintense, nonenhancing lesion (axial image 44 of series 20), compatible with a small cyst. No other suspicious hepatic lesions are noted. Status post cholecystectomy. MRCP images  demonstrate moderate intra and extrahepatic biliary ductal dilatation. Notably, the common bile duct abruptly terminates as it approaches the pancreatic head, with an appearance suggestive of compression from an extrinsic source. No intraductal lesion identified to suggest retained choledocholithiasis. Pancreas: There is a multilocular mixed cystic and solid lesion in the head of the pancreas which is poorly  defined on today's examination, but coincides with the area of extrinsic mass effect upon the common bile duct, highly concerning for primary pancreatic neoplasm. The area of apparent involvement is estimated to measure approximately 2.6 x 3.6 cm (axial image 23 of series 14), as demonstrated by multiple small T1 hypointense, T2 hyperintense, nonenhancing cysts, as well as some amorphous soft tissue stroma between the cysts which demonstrates heterogeneous low level enhancement. This is also associated with diffuse pancreatic ductal dilatation measuring up to 6 mm in the body of the pancreas. No peripancreatic fluid or inflammatory changes. Spleen:  Unremarkable. Adrenals/Urinary Tract: Multiple T1 hypointense, T2 hyperintense, nonenhancing lesions are noted in both kidneys, compatible with multiple simple cysts, all of which are subcentimeter in size. No hydroureteronephrosis in the visualized portions of the abdomen. Bilateral adrenal glands are normal in appearance. Stomach/Bowel: Visualized portions are unremarkable. Vascular/Lymphatic: No aneurysm identified in the visualized abdominal vasculature. No lymphadenopathy noted in the visualized portions of the abdomen. Other:  No significant volume portions of the peritoneal cavity. Musculoskeletal: No aggressive appearing osseous lesions are noted in the visualized portions of the skeleton. IMPRESSION: 1. 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 Further evaluation with endoscopy for endoscopic ultrasound and biopsy is in the near future to establish a tissue diagnosis. 2. 2 small lesions in the liver are compatible with cysts. Electronically Signed   By: Vinnie Langton M.D.   On: 09/26/2017 09:36   US Abdomen Limited Ruq  Result Date: 09/25/2017 CLINICAL DATA:  Hepatitis.  Elevated LFTs. EXAM: ULTRASOUND ABDOMEN  LIMITED RIGHT UPPER QUADRANT COMPARISON:  CT abdomen and pelvis dated 08/05/2017. FINDINGS: Gallbladder: Surgically absent. Common bile duct: Diameter: New dilatation up to 1.1 cm, previously 6 mm on prior CT. Liver: No focal lesion identified. New mild intrahepatic biliary dilatation. Within normal limits in parenchymal echogenicity. Portal vein is patent on color Doppler imaging with normal direction of blood flow towards the liver. IMPRESSION: 1. New extra- and intrahepatic biliary dilatation when compared to prior CT. Further evaluation with ERCP or MRCP (if the patient can tolerate breath holds) is recommended. Electronically Signed   By: Titus Dubin M.D.   On: 09/25/2017 17:11    Assessment and Plan:   Gerald Alvarez. is a 84 y.o. y/o male who comes in today for follow-up after being discharged from the hospital.  The patient has been found to have pancreatic cancer and had a stent placed in his common bile duct.  The patient has been doing better with resolution of his jaundice and lightening up of his urine.  The patient has been told that this stent cannot stay for extended period of time and he should have his plastic stent exchange for a metal stent.  The patient will be set up for an ERCP with a stent change in March.  The patient and his daughter have been explained the plan and agree with it.    Lucilla Lame, MD. Marval Regal   Note: This dictation was prepared with Viviann Spare  dictation along with smaller phrase technology. Any transcriptional errors that result from this process are unintentional.

## 2017-10-14 NOTE — Progress Notes (Unsigned)
a 

## 2017-10-16 MED ORDER — PROCHLORPERAZINE MALEATE 10 MG PO TABS: 10.0000 mg | ORAL_TABLET | Freq: Four times a day (QID) | ORAL | 2 refills | Status: AC | PRN

## 2017-10-16 MED ORDER — LIDOCAINE-PRILOCAINE 2.5-2.5 % EX CREA: TOPICAL_CREAM | CUTANEOUS | 3 refills | Status: DC

## 2017-10-16 NOTE — Patient Instructions (Signed)
Nanoparticle Albumin-Bound Paclitaxel injection What is this medicine? NANOPARTICLE ALBUMIN-BOUND PACLITAXEL (Na no PAHR ti kuhl al BYOO muhn-bound PAK li TAX el) is a chemotherapy drug. It targets fast dividing cells, like cancer cells, and causes these cells to die. This medicine is used to treat advanced breast cancer and advanced lung cancer. This medicine may be used for other purposes; ask your health care provider or pharmacist if you have questions. COMMON BRAND NAME(S): Abraxane What should I tell my health care provider before I take this medicine? They need to know if you have any of these conditions: -kidney disease -liver disease -low blood counts, like low platelets, red blood cells, or white blood cells -recent or ongoing radiation therapy -an unusual or allergic reaction to paclitaxel, albumin, other chemotherapy, other medicines, foods, dyes, or preservatives -pregnant or trying to get pregnant -breast-feeding How should I use this medicine? This drug is given as an infusion into a vein. It is administered in a hospital or clinic by a specially trained health care professional. Talk to your pediatrician regarding the use of this medicine in children. Special care may be needed. Overdosage: If you think you have taken too much of this medicine contact a poison control center or emergency room at once. NOTE: This medicine is only for you. Do not share this medicine with others. What if I miss a dose? It is important not to miss your dose. Call your doctor or health care professional if you are unable to keep an appointment. What may interact with this medicine? -cyclosporine -diazepam -ketoconazole -medicines to increase blood counts like filgrastim, pegfilgrastim, sargramostim -other chemotherapy drugs like cisplatin, doxorubicin, epirubicin, etoposide, teniposide, vincristine -quinidine -testosterone -vaccines -verapamil Talk to your doctor or health care professional  before taking any of these medicines: -acetaminophen -aspirin -ibuprofen -ketoprofen -naproxen This list may not describe all possible interactions. Give your health care provider a list of all the medicines, herbs, non-prescription drugs, or dietary supplements you use. Also tell them if you smoke, drink alcohol, or use illegal drugs. Some items may interact with your medicine. What should I watch for while using this medicine? Your condition will be monitored carefully while you are receiving this medicine. You will need important blood work done while you are taking this medicine. This medicine can cause serious allergic reactions. If you experience allergic reactions like skin rash, itching or hives, swelling of the face, lips, or tongue, tell your doctor or health care professional right away. In some cases, you may be given additional medicines to help with side effects. Follow all directions for their use. This drug may make you feel generally unwell. This is not uncommon, as chemotherapy can affect healthy cells as well as cancer cells. Report any side effects. Continue your course of treatment even though you feel ill unless your doctor tells you to stop. Call your doctor or health care professional for advice if you get a fever, chills or sore throat, or other symptoms of a cold or flu. Do not treat yourself. This drug decreases your body's ability to fight infections. Try to avoid being around people who are sick. This medicine may increase your risk to bruise or bleed. Call your doctor or health care professional if you notice any unusual bleeding. Be careful brushing and flossing your teeth or using a toothpick because you may get an infection or bleed more easily. If you have any dental work done, tell your dentist you are receiving this medicine. Avoid taking products that  notice any unusual bleeding.  Be careful brushing and flossing your teeth or using a toothpick because you may get an infection or bleed more easily. If you have any dental work done, tell your dentist you are receiving this medicine.  Avoid taking products that contain aspirin, acetaminophen, ibuprofen, naproxen, or ketoprofen unless instructed by your doctor. These  medicines may hide a fever.  Do not become pregnant while taking this medicine. Women should inform their doctor if they wish to become pregnant or think they might be pregnant. There is a potential for serious side effects to an unborn child. Talk to your health care professional or pharmacist for more information. Do not breast-feed an infant while taking this medicine.  Men are advised not to father a child while receiving this medicine.  What side effects may I notice from receiving this medicine?  Side effects that you should report to your doctor or health care professional as soon as possible:  -allergic reactions like skin rash, itching or hives, swelling of the face, lips, or tongue  -low blood counts - This drug may decrease the number of white blood cells, red blood cells and platelets. You may be at increased risk for infections and bleeding.  -signs of infection - fever or chills, cough, sore throat, pain or difficulty passing urine  -signs of decreased platelets or bleeding - bruising, pinpoint red spots on the skin, black, tarry stools, nosebleeds  -signs of decreased red blood cells - unusually weak or tired, fainting spells, lightheadedness  -breathing problems  -changes in vision  -chest pain  -high or low blood pressure  -mouth sores  -nausea and vomiting  -pain, swelling, redness or irritation at the injection site  -pain, tingling, numbness in the hands or feet  -slow or irregular heartbeat  -swelling of the ankle, feet, hands  Side effects that usually do not require medical attention (report to your doctor or health care professional if they continue or are bothersome):  -aches, pains  -changes in the color of fingernails  -diarrhea  -hair loss  -loss of appetite  This list may not describe all possible side effects. Call your doctor for medical advice about side effects. You may report side effects to FDA at 1-800-FDA-1088.  Where should I keep my medicine?  This drug is given in a hospital  or clinic and will not be stored at home.  NOTE: This sheet is a summary. It may not cover all possible information. If you have questions about this medicine, talk to your doctor, pharmacist, or health care provider.   2018 Elsevier/Gold Standard (2015-06-27 10:05:20)  Gemcitabine injection  What is this medicine?  GEMCITABINE (jem SIT a been) is a chemotherapy drug. This medicine is used to treat many types of cancer like breast cancer, lung cancer, pancreatic cancer, and ovarian cancer.  This medicine may be used for other purposes; ask your health care provider or pharmacist if you have questions.  COMMON BRAND NAME(S): Gemzar  What should I tell my health care provider before I take this medicine?  They need to know if you have any of these conditions:  -blood disorders  -infection  -kidney disease  -liver disease  -recent or ongoing radiation therapy  -an unusual or allergic reaction to gemcitabine, other chemotherapy, other medicines, foods, dyes, or preservatives  -pregnant or trying to get pregnant  -breast-feeding  How should I use this medicine?  This drug is given as an infusion into a vein. It is administered   in a hospital or clinic by a specially trained health care professional.  Talk to your pediatrician regarding the use of this medicine in children. Special care may be needed.  Overdosage: If you think you have taken too much of this medicine contact a poison control center or emergency room at once.  NOTE: This medicine is only for you. Do not share this medicine with others.  What if I miss a dose?  It is important not to miss your dose. Call your doctor or health care professional if you are unable to keep an appointment.  What may interact with this medicine?  -medicines to increase blood counts like filgrastim, pegfilgrastim, sargramostim  -some other chemotherapy drugs like cisplatin  -vaccines  Talk to your doctor or health care professional before taking any of these  medicines:  -acetaminophen  -aspirin  -ibuprofen  -ketoprofen  -naproxen  This list may not describe all possible interactions. Give your health care provider a list of all the medicines, herbs, non-prescription drugs, or dietary supplements you use. Also tell them if you smoke, drink alcohol, or use illegal drugs. Some items may interact with your medicine.  What should I watch for while using this medicine?  Visit your doctor for checks on your progress. This drug may make you feel generally unwell. This is not uncommon, as chemotherapy can affect healthy cells as well as cancer cells. Report any side effects. Continue your course of treatment even though you feel ill unless your doctor tells you to stop.  In some cases, you may be given additional medicines to help with side effects. Follow all directions for their use.  Call your doctor or health care professional for advice if you get a fever, chills or sore throat, or other symptoms of a cold or flu. Do not treat yourself. This drug decreases your body's ability to fight infections. Try to avoid being around people who are sick.  This medicine may increase your risk to bruise or bleed. Call your doctor or health care professional if you notice any unusual bleeding.  Be careful brushing and flossing your teeth or using a toothpick because you may get an infection or bleed more easily. If you have any dental work done, tell your dentist you are receiving this medicine.  Avoid taking products that contain aspirin, acetaminophen, ibuprofen, naproxen, or ketoprofen unless instructed by your doctor. These medicines may hide a fever.  Women should inform their doctor if they wish to become pregnant or think they might be pregnant. There is a potential for serious side effects to an unborn child. Talk to your health care professional or pharmacist for more information. Do not breast-feed an infant while taking this medicine.  What side effects may I notice from  receiving this medicine?  Side effects that you should report to your doctor or health care professional as soon as possible:  -allergic reactions like skin rash, itching or hives, swelling of the face, lips, or tongue  -low blood counts - this medicine may decrease the number of white blood cells, red blood cells and platelets. You may be at increased risk for infections and bleeding.  -signs of infection - fever or chills, cough, sore throat, pain or difficulty passing urine  -signs of decreased platelets or bleeding - bruising, pinpoint red spots on the skin, black, tarry stools, blood in the urine  -signs of decreased red blood cells - unusually weak or tired, fainting spells, lightheadedness  -breathing problems  -chest pain  -  mouth sores  -nausea and vomiting  -pain, swelling, redness at site where injected  -pain, tingling, numbness in the hands or feet  -stomach pain  -swelling of ankles, feet, hands  -unusual bleeding  Side effects that usually do not require medical attention (report to your doctor or health care professional if they continue or are bothersome):  -constipation  -diarrhea  -hair loss  -loss of appetite  -stomach upset  This list may not describe all possible side effects. Call your doctor for medical advice about side effects. You may report side effects to FDA at 1-800-FDA-1088.  Where should I keep my medicine?  This drug is given in a hospital or clinic and will not be stored at home.  NOTE: This sheet is a summary. It may not cover all possible information. If you have questions about this medicine, talk to your doctor, pharmacist, or health care provider.   2018 Elsevier/Gold Standard (2008-01-04 18:45:54)

## 2017-10-19 ENCOUNTER — Inpatient Hospital Stay: Payer: Medicare Other

## 2017-10-20 ENCOUNTER — Ambulatory Visit
Admission: RE | Admit: 2017-10-20 | Discharge: 2017-10-20 | Disposition: A | Discharge status: Home / Self Care | Payer: Medicare Other | Source: Ambulatory Visit | Attending: Oncology | Admitting: Oncology

## 2017-10-20 DIAGNOSIS — I2510 Atherosclerotic heart disease of native coronary artery without angina pectoris: Secondary | ICD-10-CM | POA: Diagnosis not present

## 2017-10-20 DIAGNOSIS — D136 Benign neoplasm of pancreas: Secondary | ICD-10-CM | POA: Diagnosis present

## 2017-10-20 DIAGNOSIS — I700 Atherosclerosis of aorta: Secondary | ICD-10-CM | POA: Insufficient documentation

## 2017-10-20 LAB — GLUCOSE, CAPILLARY: Glucose-Capillary: 148 mg/dL — ABNORMAL HIGH (ref 65–99)

## 2017-10-20 MED ORDER — FLUDEOXYGLUCOSE F - 18 (FDG) INJECTION
12.4700 | Freq: Once | INTRAVENOUS | Status: AC | PRN
  Administered 2017-10-20: 12.47 via INTRAVENOUS

## 2017-10-21 ENCOUNTER — Other Ambulatory Visit (INDEPENDENT_AMBULATORY_CARE_PROVIDER_SITE_OTHER): Payer: Self-pay | Admitting: Vascular Surgery

## 2017-10-21 MED ORDER — CEFAZOLIN SODIUM-DEXTROSE 2-4 GM/100ML-% IV SOLN
2.0000 g | Freq: Once | INTRAVENOUS | Status: AC
  Administered 2017-10-22: 2 g via INTRAVENOUS

## 2017-10-22 ENCOUNTER — Encounter
Admission: RE | Disposition: A | Discharge status: Home / Self Care | Payer: Self-pay | Source: Ambulatory Visit | Attending: Vascular Surgery

## 2017-10-22 ENCOUNTER — Ambulatory Visit
Admission: RE | Admit: 2017-10-22 | Discharge: 2017-10-22 | Disposition: A | Discharge status: Home / Self Care | Payer: Medicare Other | Source: Ambulatory Visit | Attending: Vascular Surgery | Admitting: Vascular Surgery

## 2017-10-22 DIAGNOSIS — Z8546 Personal history of malignant neoplasm of prostate: Secondary | ICD-10-CM | POA: Diagnosis not present

## 2017-10-22 DIAGNOSIS — Z79899 Other long term (current) drug therapy: Secondary | ICD-10-CM | POA: Diagnosis not present

## 2017-10-22 DIAGNOSIS — Z8052 Family history of malignant neoplasm of bladder: Secondary | ICD-10-CM | POA: Insufficient documentation

## 2017-10-22 DIAGNOSIS — E119 Type 2 diabetes mellitus without complications: Secondary | ICD-10-CM | POA: Diagnosis not present

## 2017-10-22 DIAGNOSIS — Z803 Family history of malignant neoplasm of breast: Secondary | ICD-10-CM | POA: Insufficient documentation

## 2017-10-22 DIAGNOSIS — N3281 Overactive bladder: Secondary | ICD-10-CM | POA: Insufficient documentation

## 2017-10-22 DIAGNOSIS — Z87891 Personal history of nicotine dependence: Secondary | ICD-10-CM | POA: Insufficient documentation

## 2017-10-22 DIAGNOSIS — Z8042 Family history of malignant neoplasm of prostate: Secondary | ICD-10-CM | POA: Insufficient documentation

## 2017-10-22 DIAGNOSIS — N400 Enlarged prostate without lower urinary tract symptoms: Secondary | ICD-10-CM | POA: Diagnosis not present

## 2017-10-22 DIAGNOSIS — R32 Unspecified urinary incontinence: Secondary | ICD-10-CM | POA: Insufficient documentation

## 2017-10-22 DIAGNOSIS — Z8049 Family history of malignant neoplasm of other genital organs: Secondary | ICD-10-CM | POA: Diagnosis not present

## 2017-10-22 DIAGNOSIS — Z806 Family history of leukemia: Secondary | ICD-10-CM | POA: Insufficient documentation

## 2017-10-22 DIAGNOSIS — C259 Malignant neoplasm of pancreas, unspecified: Secondary | ICD-10-CM | POA: Diagnosis present

## 2017-10-22 DIAGNOSIS — Z881 Allergy status to other antibiotic agents status: Secondary | ICD-10-CM | POA: Diagnosis not present

## 2017-10-22 DIAGNOSIS — Z9049 Acquired absence of other specified parts of digestive tract: Secondary | ICD-10-CM | POA: Insufficient documentation

## 2017-10-22 DIAGNOSIS — Z807 Family history of other malignant neoplasms of lymphoid, hematopoietic and related tissues: Secondary | ICD-10-CM | POA: Diagnosis not present

## 2017-10-22 DIAGNOSIS — R351 Nocturia: Secondary | ICD-10-CM | POA: Insufficient documentation

## 2017-10-22 DIAGNOSIS — Z9889 Other specified postprocedural states: Secondary | ICD-10-CM | POA: Diagnosis not present

## 2017-10-22 HISTORY — PX: PORTA CATH INSERTION: CATH118285

## 2017-10-22 LAB — GLUCOSE, CAPILLARY: Glucose-Capillary: 107 mg/dL — ABNORMAL HIGH (ref 65–99)

## 2017-10-22 SURGERY — PORTA CATH INSERTION
Anesthesia: Moderate Sedation

## 2017-10-22 MED ORDER — ACETAMINOPHEN 500 MG PO TABS
1000.0000 mg | ORAL_TABLET | ORAL | Status: DC | PRN
  Administered 2017-10-22: 500 mg via ORAL

## 2017-10-22 MED ORDER — FENTANYL CITRATE (PF) 100 MCG/2ML IJ SOLN
INTRAMUSCULAR | Status: DC | PRN
  Administered 2017-10-22: 50 ug via INTRAVENOUS

## 2017-10-22 MED ORDER — ACETAMINOPHEN 500 MG PO TABS
ORAL_TABLET | ORAL | Status: AC
  Filled 2017-10-22: qty 2

## 2017-10-22 MED ORDER — SODIUM CHLORIDE 0.9 % IV SOLN
INTRAVENOUS | Status: DC
  Administered 2017-10-22: 13:00:00 via INTRAVENOUS

## 2017-10-22 MED ORDER — MIDAZOLAM HCL 2 MG/2ML IJ SOLN
INTRAMUSCULAR | Status: DC | PRN
  Administered 2017-10-22: 1 mg via INTRAVENOUS

## 2017-10-22 MED ORDER — MIDAZOLAM HCL 2 MG/ML PO SYRP
4.0000 mg | ORAL_SOLUTION | Freq: Once | ORAL | Status: AC
  Administered 2017-10-22: 4 mg via ORAL

## 2017-10-22 MED ORDER — ONDANSETRON HCL 4 MG/2ML IJ SOLN: 4.0000 mg | Freq: Four times a day (QID) | INTRAMUSCULAR | Status: DC | PRN

## 2017-10-22 MED ORDER — MIDAZOLAM HCL 2 MG/2ML IJ SOLN
INTRAMUSCULAR | Status: AC
  Filled 2017-10-22: qty 2

## 2017-10-22 MED ORDER — SODIUM CHLORIDE 0.9 % IR SOLN
Freq: Once | Status: AC
  Administered 2017-10-22: 16:00:00
  Filled 2017-10-22: qty 2

## 2017-10-22 MED ORDER — MIDAZOLAM HCL 2 MG/ML PO SYRP
ORAL_SOLUTION | ORAL | Status: AC
  Administered 2017-10-22: 4 mg via ORAL
  Filled 2017-10-22: qty 4

## 2017-10-22 MED ORDER — HYDROMORPHONE HCL 1 MG/ML IJ SOLN: 1.0000 mg | Freq: Once | INTRAMUSCULAR | Status: DC | PRN

## 2017-10-22 MED ORDER — FENTANYL CITRATE (PF) 100 MCG/2ML IJ SOLN
INTRAMUSCULAR | Status: AC
  Filled 2017-10-22: qty 2

## 2017-10-22 SURGICAL SUPPLY — 13 items
CANNULA 5F STIFF (CANNULA) ×3 IMPLANT
COVER PROBE U/S 5X48 (MISCELLANEOUS) ×3 IMPLANT
DERMABOND ADVANCED (GAUZE/BANDAGES/DRESSINGS) ×2
DERMABOND ADVANCED .7 DNX12 (GAUZE/BANDAGES/DRESSINGS) ×1 IMPLANT
KIT PORT POWER 8FR ISP CVUE (Miscellaneous) ×3 IMPLANT
PACK ANGIOGRAPHY (CUSTOM PROCEDURE TRAY) ×3 IMPLANT
PENCIL ELECTRO HAND CTR (MISCELLANEOUS) ×3 IMPLANT
SPONGE XRAY 4X4 16PLY STRL (MISCELLANEOUS) ×3 IMPLANT
SUT MNCRL AB 4-0 PS2 18 (SUTURE) ×3 IMPLANT
SUT PROLENE 0 CT 1 30 (SUTURE) ×3 IMPLANT
SUT VIC AB 3-0 SH 27 (SUTURE) ×2
SUT VIC AB 3-0 SH 27X BRD (SUTURE) ×1 IMPLANT
TOWEL OR 17X26 4PK STRL BLUE (TOWEL DISPOSABLE) ×3 IMPLANT

## 2017-10-22 NOTE — Op Note (Signed)
      [facility]       Operative Note  Date: 10/22/2017  Preoperative diagnosis:  1. Pancreas cancer  Postoperative diagnosis:  Same as above  Procedures: #1. Ultrasound guidance for vascular access to the right internal jugular vein. #2. Fluoroscopic guidance for placement of catheter. #3. Placement of CT compatible Port-A-Cath, right internal jugular vein.  Surgeon: Leotis Pain, MD.   Anesthesia: Local with moderate conscious sedation for approximately 15  minutes using 1 mg of Versed and 50 mcg of Fentanyl  Fluoroscopy time: less than 1 minute  Contrast used: 0  Estimated blood loss: 5 cc  Indication for the procedure:  The patient is a 84 y.o.male with pancreas cancer.  The patient needs a Port-A-Cath for durable venous access, chemotherapy, lab draws, and CT scans. We are asked to place this. Risks and benefits were discussed and informed consent was obtained.  Description of procedure: The patient was brought to the vascular and interventional radiology suite.  Moderate conscious sedation was administered throughout the procedure during a face to face encounter with the patient with my supervision of the RN administering medicines and monitoring the patient's vital signs, pulse oximetry, telemetry and mental status throughout from the start of the procedure until the patient was taken to the recovery room. The right neck chest and shoulder were sterilely prepped and draped, and a sterile surgical field was created. Ultrasound was used to help visualize a patent right internal jugular vein. This was then accessed under direct ultrasound guidance without difficulty with the Seldinger needle and a permanent image was recorded. A J-wire was placed. After skin nick and dilatation, the peel-away sheath was then placed over the wire. I then anesthetized an area under the clavicle approximately 1-2 fingerbreadths. A transverse incision was created and an inferior pocket  was created with electrocautery and blunt dissection. The port was then brought onto the field, placed into the pocket and secured to the chest wall with 2 Prolene sutures. The catheter was connected to the port and tunneled from the subclavicular incision to the access site. Fluoroscopic guidance was then used to cut the catheter to an appropriate length. The catheter was then placed through the peel-away sheath and the peel-away sheath was removed. The catheter tip was parked in excellent location under fluorocoscopic guidance in the cavoatrial junction. The pocket was then irrigated with antibiotic impregnated saline and the wound was closed with a running 3-0 Vicryl and a 4-0 Monocryl. The access incision was closed with a single 4-0 Monocryl. The Huber needle was used to withdraw blood and flush the port with heparinized saline. Dermabond was then placed as a dressing. The patient tolerated the procedure well and was taken to the recovery room in stable condition.   Leotis Pain 10/22/2017 4:45 PM   This note was created with Dragon Medical transcription system. Any errors in dictation are purely unintentional.

## 2017-10-22 NOTE — Discharge Instructions (Signed)
Implanted Port Insertion, Care After °This sheet gives you information about how to care for yourself after your procedure. Your health care provider may also give you more specific instructions. If you have problems or questions, contact your health care provider. °What can I expect after the procedure? °After your procedure, it is common to have: °· Discomfort at the port insertion site. °· Bruising on the skin over the port. This should improve over 3-4 days. ° °Follow these instructions at home: °Port care °· After your port is placed, you will get a manufacturer's information card. The card has information about your port. Keep this card with you at all times. °· Take care of the port as told by your health care provider. Ask your health care provider if you or a family member can get training for taking care of the port at home. A home health care nurse may also take care of the port. °· Make sure to remember what type of port you have. °Incision care °· Follow instructions from your health care provider about how to take care of your port insertion site. Make sure you: °? Wash your hands with soap and water before you change your bandage (dressing). If soap and water are not available, use hand sanitizer. °? Change your dressing as told by your health care provider. °? Leave stitches (sutures), skin glue, or adhesive strips in place. These skin closures may need to stay in place for 2 weeks or longer. If adhesive strip edges start to loosen and curl up, you may trim the loose edges. Do not remove adhesive strips completely unless your health care provider tells you to do that. °· Check your port insertion site every day for signs of infection. Check for: °? More redness, swelling, or pain. °? More fluid or blood. °? Warmth. °? Pus or a bad smell. °General instructions °· Do not take baths, swim, or use a hot tub until your health care provider approves. °· Do not lift anything that is heavier than 10 lb (4.5  kg) for a week, or as told by your health care provider. °· Ask your health care provider when it is okay to: °? Return to work or school. °? Resume usual physical activities or sports. °· Do not drive for 24 hours if you were given a medicine to help you relax (sedative). °· Take over-the-counter and prescription medicines only as told by your health care provider. °· Wear a medical alert bracelet in case of an emergency. This will tell any health care providers that you have a port. °· Keep all follow-up visits as told by your health care provider. This is important. °Contact a health care provider if: °· You cannot flush your port with saline as directed, or you cannot draw blood from the port. °· You have a fever or chills. °· You have more redness, swelling, or pain around your port insertion site. °· You have more fluid or blood coming from your port insertion site. °· Your port insertion site feels warm to the touch. °· You have pus or a bad smell coming from the port insertion site. °Get help right away if: °· You have chest pain or shortness of breath. °· You have bleeding from your port that you cannot control. °Summary °· Take care of the port as told by your health care provider. °· Change your dressing as told by your health care provider. °· Keep all follow-up visits as told by your health care provider. °  This information is not intended to replace advice given to you by your health care provider. Make sure you discuss any questions you have with your health care provider. °Document Released: 06/15/2013 Document Revised: 07/16/2016 Document Reviewed: 07/16/2016 °Elsevier Interactive Patient Education © 2017 [employer] ° °

## 2017-10-22 NOTE — H&P (Signed)
[facility] History & Physical Update  The patient was interviewed and re-examined.  The patient's previous History and Physical has been reviewed and is unchanged.  There is no change in the plan of care. We plan to proceed with the scheduled procedure.  Leotis Pain, MD  10/22/2017, 3:13 PM

## 2017-10-23 ENCOUNTER — Encounter: Payer: Self-pay | Admitting: Vascular Surgery

## 2017-10-25 NOTE — Progress Notes (Signed)
[place]  Telephone:([phone] [phone]  ID: Gerald Alvarez. OB: 1925/11/25  MR#: [id number]  QJJ#:[id number]  Patient Care Team: Rusty Aus, MD as PCP - General (Internal Medicine) Clent Jacks, RN as Registered Nurse   CHIEF COMPLAINT: Stage IIB pancreatic adenocarcinoma.  INTERVAL HISTORY: Patient returns to clinic today or further evaluation and initiation of cycle 1, day 1 of gemcitabine and Abraxane.  He had his port placement last week without complication.He continues to have occasional nausea, but denies any pain.  He has a fair appetite.  He has no neurologic complaints.  He denies any recent fevers or illnesses.  He has no chest pain or shortness of breath.  He denies any vomiting or diarrhea.  He has no melena or hematochezia.  He has no urinary complaints.  Patient otherwise feels well and offers no further specific complaints.  REVIEW OF SYSTEMS:   Review of Systems  Constitutional: Negative.  Negative for fever, malaise/fatigue and weight loss.  Respiratory: Negative.  Negative for cough and shortness of breath.   Cardiovascular: Negative.  Negative for chest pain and leg swelling.  Gastrointestinal: Positive for constipation and nausea. Negative for abdominal pain, blood in stool, diarrhea and melena.  Genitourinary: Negative.  Negative for dysuria.  Musculoskeletal: Negative.   Skin: Negative.  Negative for rash.  Neurological: Negative for weakness.  Psychiatric/Behavioral: Negative.  The patient is not nervous/anxious.     As per HPI. Otherwise, a complete review of systems is negative.  PAST MEDICAL HISTORY: Past Medical History:  Diagnosis Date  . BPH (benign prostatic hyperplasia)   . Diabetes mellitus without complication ([place])   . Incontinence   . Nocturia   . OAB (overactive bladder)   . Prostate cancer ([place])     PAST SURGICAL HISTORY: Past Surgical History:  Procedure Laterality Date  . CATARACT  EXTRACTION, BILATERAL Bilateral   . CHOLECYSTECTOMY    . ENDOSCOPIC RETROGRADE CHOLANGIOPANCREATOGRAPHY (ERCP) WITH PROPOFOL N/A 09/27/2017   Procedure: ENDOSCOPIC RETROGRADE CHOLANGIOPANCREATOGRAPHY (ERCP) WITH PROPOFOL;  Surgeon: Lucilla Lame, MD;  Location: [facility] ENDOSCOPY;  Service: Endoscopy;  Laterality: N/A;  . HERNIA REPAIR     x 3  . PORTA CATH INSERTION N/A 10/22/2017   Procedure: PORTA CATH INSERTION;  Surgeon: Algernon Huxley, MD;  Location: [facility];  Service: Cardiovascular;  Laterality: N/A;  . PROSTATECTOMY      FAMILY HISTORY: Family History  Problem Relation Age of Onset  . Prostate cancer Brother   . Bladder Cancer Brother   . Pancreatic cancer Brother   . Prostate cancer Brother   . Lymphoma Mother   . Leukemia Father   . Breast cancer Sister   . Uterine cancer Sister   . Kidney cancer Neg Hx     ADVANCED DIRECTIVES (Y/N):  N  HEALTH MAINTENANCE: Social History   Tobacco Use  . Smoking status: Former Smoker    Last attempt to quit: 03/15/1967    Years since quitting: 50.6  . Smokeless tobacco: Former Systems developer    Types: Chew  . Tobacco comment: quit long time  Substance Use Topics  . Alcohol use: No    Alcohol/week: 0.0 oz  . Drug use: No     Colonoscopy:  PAP:  Bone density:  Lipid panel:  Allergies  Allergen Reactions  . Sulfa Antibiotics Nausea And Vomiting    Current Outpatient Medications  Medication Sig Dispense Refill  . Cholecalciferol (VITAMIN D3) 2000 units capsule Take 2,000 Units  by mouth daily.     Marland Kitchen doxazosin (CARDURA) 2 MG tablet Take 2 mg by mouth daily.     . DULoxetine (CYMBALTA) 20 MG capsule Take 1 capsule by mouth daily.    . hydrochlorothiazide (MICROZIDE) 12.5 MG capsule Take 12.5 mg by mouth as needed.    . lactulose (CHRONULAC) 10 GM/15ML solution Take by mouth 2 (two) times daily.     Marland Kitchen LEVEMIR FLEXTOUCH 100 UNIT/ML Pen Inject 15 Units into the skin daily at 10 pm. (Patient taking differently: Inject 5 Units into  the skin daily at 10 pm. ) 15 mL   . lidocaine-prilocaine (EMLA) cream Apply to affected area once 30 g 3  . Multiple Vitamin (MULTI-VITAMINS) TABS Take 1 tablet by mouth daily.     . pantoprazole (PROTONIX) 40 MG tablet TAKE 1 TABLET TWICE A DAY    . protein supplement shake (PREMIER PROTEIN) LIQD Take 325 mLs (11 oz total) by mouth 2 (two) times daily between meals. 30 Can 0  . sertraline (ZOLOFT) 50 MG tablet Take 1 tablet by mouth daily.    . traZODone (DESYREL) 50 MG tablet Take 50 mg by mouth at bedtime.     . vitamin B-12 (CYANOCOBALAMIN) 1000 MCG tablet Take 1,000 mcg by mouth.     . hydrOXYzine (VISTARIL) 25 MG capsule Take 12.5 mg by mouth 3 (three) times daily as needed for itching.    . ondansetron (ZOFRAN) 4 MG tablet Take 1 tablet (4 mg total) by mouth every 8 (eight) hours as needed for nausea or vomiting. 30 tablet 2  . prochlorperazine (COMPAZINE) 10 MG tablet Take 1 tablet (10 mg total) by mouth every 6 (six) hours as needed (Nausea or vomiting). (Patient not taking: Reported on 10/27/2017) 60 tablet 2   No current facility-administered medications for this visit.    Facility-Administered Medications Ordered in Other Visits  Medication Dose Route Frequency Provider Last Rate Last Dose  . heparin lock flush 100 unit/mL  500 Units Intravenous Once Lloyd Huger, MD      . sodium chloride flush (NS) 0.9 % injection 10 mL  10 mL Intravenous PRN Lloyd Huger, MD   10 mL at 10/27/17 0853    OBJECTIVE: Vitals:   10/27/17 0931  BP: (!) 158/78  Pulse: 76  Resp: 18  Temp: (!) 96.6 F (35.9 C)     Body mass index is 25.4 kg/m.    ECOG FS:0 - Asymptomatic  General: Well-developed, well-nourished, no acute distress. Eyes: Pink conjunctiva, anicteric sclera. Lungs: Clear to auscultation bilaterally. Heart: Regular rate and rhythm. No rubs, murmurs, or gallops. Abdomen: Soft, nontender, nondistended. No organomegaly noted, normoactive bowel sounds. Musculoskeletal:  No edema, cyanosis, or clubbing. Neuro: Alert, answering all questions appropriately. Cranial nerves grossly intact. Skin: No rashes or petechiae noted. Psych: Normal affect.   LAB RESULTS:  Lab Results  Component Value Date   NA 138 10/27/2017   K 3.7 10/27/2017   CL 104 10/27/2017   CO2 27 10/27/2017   GLUCOSE 140 (H) 10/27/2017   BUN 19 10/27/2017   CREATININE 0.81 10/27/2017   CALCIUM 9.0 10/27/2017   PROT 6.6 10/27/2017   ALBUMIN 3.4 (L) 10/27/2017   AST 20 10/27/2017   ALT 13 (L) 10/27/2017   ALKPHOS 73 10/27/2017   BILITOT 1.4 (H) 10/27/2017   GFRNONAA >60 10/27/2017   GFRAA >60 10/27/2017    Lab Results  Component Value Date   WBC 6.0 10/27/2017   NEUTROABS 3.6 10/27/2017  HGB 13.5 10/27/2017   HCT 38.6 (L) 10/27/2017   MCV 103.6 (H) 10/27/2017   PLT 205 10/27/2017     STUDIES: Nm Pet Image Initial (pi) Skull Base To Thigh  Result Date: 10/20/2017 CLINICAL DATA:  Initial treatment strategy for new diagnosis of pancreatic cancer. EXAM: NUCLEAR MEDICINE PET SKULL BASE TO THIGH TECHNIQUE: 12.5 mCi F-18 FDG was injected intravenously. Full-ring PET imaging was performed from the skull base to thigh after the radiotracer. CT data was obtained and used for attenuation correction and anatomic localization. FASTING BLOOD GLUCOSE:  Value: 148 mg/dl COMPARISON:  MRI of 09/26/2017. CT of 09/25/2017. Chest CT 01/04/2017. FINDINGS: NECK: No areas of abnormal hypermetabolism. Cerebral atrophy is age appropriate. Bilateral carotid atherosclerosis. No cervical adenopathy. CHEST: Hypermetabolism about the right hilum is without definite CT correlate and favored to be reactive. This measures a S.U.V. max of 3.5, including on image 101/series 3. Mild cardiomegaly with multivessel coronary artery atherosclerosis. Aortic atherosclerosis. Tortuous thoracic aorta. Right hemidiaphragm elevation with right base volume loss and atelectasis. ABDOMEN/PELVIS: Hypermetabolic node along the  celiac measures 12 mm and a S.U.V. max of 3.3 on image 147/series 3. Hypermetabolism along the course of the common duct stent in the region of the pancreatic head and uncinate process. This measures a S.U.V. max of 4.6, including on image 159/series 3. Pneumobilia. Normal adrenal glands. Left renal vascular calcifications. Colonic stool burden suggests constipation. Prostatectomy. SKELETON: No abnormal marrow activity. Osteopenia. Sclerotic lesion in the left iliac is similar to 10/16/2016 and favored to represent a bone island. Osteopenia. Sixth anterior right rib fracture is felt to be new compared to 01/04/2017. Mild compression deformities throughout the thoracic spine, present 01/04/2017. IMPRESSION: 1. Hypermetabolism within the pancreatic head/uncinate process. Likely corresponding to the clinical history of adenocarcinoma. Nonspecific in the setting of a common duct stent. 2. Celiac hypermetabolic lymph node, most consistent with localized nodal metastasis. 3. No distant hypermetabolic metastasis identified. 4. Coronary artery atherosclerosis. Aortic Atherosclerosis (ICD10-I70.0). 5. Right hilar hypermetabolism is without CT correlate and favored to be reactive. Recommend attention on follow-up. Electronically Signed   By: Abigail Miyamoto M.D.   On: 10/20/2017 13:46    ASSESSMENT: Stage IIB pancreatic adenocarcinoma  PLAN:    1.  Stage IIB pancreatic adenocarcinoma: MRI and MRCP results reviewed independently.  Patient recently had [facility] that confirmed adenocarcinoma.  His Ca 19-9 is also elevated at 386.  Patient is not a surgical candidate, but has agreed to pursue palliative chemotherapy using gemcitabine and Abraxane.  PET scan results from October 20, 2017 reviewed independently and reported as above. Will dose reduce gemcitabine to 800 mg/m given his advanced age. He will receive treatment on days 1, 8, and 15 with day 22 off.  Proceed with cycle 1, day 1 of gemcitabine and  Abraxane. Return to clinic in 1 week for consideration of cycle 1, day 8. 2.  Elevated liver enzymes: Resolved.  Patient has a permanent stent replacement on November 17, 2017. 3. Nausea: Continue Zofran and compazine as prescribed. 4. Constipation: Patient does not complain of this today. 5.  Elevated bilirubin: Stent replacement as above.  Monitor.  Approximately 30 minutes was spent in discussion of which great than 50% was consultation.  Patient expressed understanding and was in agreement with this plan. He also understands that He can call clinic at any time with any questions, concerns, or complaints.   Cancer Staging Pancreatic adenocarcinoma [facility]) Staging form: Exocrine Pancreas, AJCC 8th Edition - Clinical stage  from 10/16/2017: Stage IIB (cT3, cN1, cM0) - Signed by Lloyd Huger, MD on 10/25/2017   Lloyd Huger, MD   10/27/2017 10:04 AM

## 2017-10-27 ENCOUNTER — Inpatient Hospital Stay (HOSPITAL_BASED_OUTPATIENT_CLINIC_OR_DEPARTMENT_OTHER): Payer: Medicare Other | Admitting: Oncology

## 2017-10-27 ENCOUNTER — Inpatient Hospital Stay: Payer: Medicare Other

## 2017-10-27 ENCOUNTER — Other Ambulatory Visit: Payer: Self-pay

## 2017-10-27 VITALS — BP 158/78 | HR 76 | Temp 96.6°F | Resp 18 | Wt 148.0 lb

## 2017-10-27 DIAGNOSIS — Z5111 Encounter for antineoplastic chemotherapy: Secondary | ICD-10-CM

## 2017-10-27 DIAGNOSIS — I700 Atherosclerosis of aorta: Secondary | ICD-10-CM

## 2017-10-27 DIAGNOSIS — Z803 Family history of malignant neoplasm of breast: Secondary | ICD-10-CM

## 2017-10-27 DIAGNOSIS — Z87891 Personal history of nicotine dependence: Secondary | ICD-10-CM

## 2017-10-27 DIAGNOSIS — M8580 Other specified disorders of bone density and structure, unspecified site: Secondary | ICD-10-CM

## 2017-10-27 DIAGNOSIS — K5900 Constipation, unspecified: Secondary | ICD-10-CM | POA: Diagnosis not present

## 2017-10-27 DIAGNOSIS — N400 Enlarged prostate without lower urinary tract symptoms: Secondary | ICD-10-CM

## 2017-10-27 DIAGNOSIS — R110 Nausea: Secondary | ICD-10-CM

## 2017-10-27 DIAGNOSIS — I6523 Occlusion and stenosis of bilateral carotid arteries: Secondary | ICD-10-CM

## 2017-10-27 DIAGNOSIS — Z806 Family history of leukemia: Secondary | ICD-10-CM

## 2017-10-27 DIAGNOSIS — C259 Malignant neoplasm of pancreas, unspecified: Secondary | ICD-10-CM

## 2017-10-27 DIAGNOSIS — Z8052 Family history of malignant neoplasm of bladder: Secondary | ICD-10-CM

## 2017-10-27 DIAGNOSIS — Z8042 Family history of malignant neoplasm of prostate: Secondary | ICD-10-CM

## 2017-10-27 DIAGNOSIS — R32 Unspecified urinary incontinence: Secondary | ICD-10-CM | POA: Diagnosis not present

## 2017-10-27 DIAGNOSIS — Z79899 Other long term (current) drug therapy: Secondary | ICD-10-CM

## 2017-10-27 DIAGNOSIS — E119 Type 2 diabetes mellitus without complications: Secondary | ICD-10-CM | POA: Diagnosis not present

## 2017-10-27 DIAGNOSIS — Z800 Family history of malignant neoplasm of digestive organs: Secondary | ICD-10-CM

## 2017-10-27 LAB — CBC WITH DIFFERENTIAL/PLATELET
Basophils Absolute: 0 10*3/uL (ref 0–0.1)
Basophils Relative: 1 %
Eosinophils Absolute: 0.2 10*3/uL (ref 0–0.7)
Eosinophils Relative: 4 %
HEMATOCRIT: 38.6 % — AB (ref 40.0–52.0)
HEMOGLOBIN: 13.5 g/dL (ref 13.0–18.0)
LYMPHS ABS: 1.5 10*3/uL (ref 1.0–3.6)
Lymphocytes Relative: 25 %
MCH: 36.2 pg — AB (ref 26.0–34.0)
MCHC: 34.9 g/dL (ref 32.0–36.0)
MCV: 103.6 fL — ABNORMAL HIGH (ref 80.0–100.0)
MONOS PCT: 10 %
Monocytes Absolute: 0.6 10*3/uL (ref 0.2–1.0)
NEUTROS ABS: 3.6 10*3/uL (ref 1.4–6.5)
NEUTROS PCT: 60 %
Platelets: 205 10*3/uL (ref 150–440)
RBC: 3.73 MIL/uL — ABNORMAL LOW (ref 4.40–5.90)
RDW: 14.4 % (ref 11.5–14.5)
WBC: 6 10*3/uL (ref 3.8–10.6)

## 2017-10-27 LAB — COMPREHENSIVE METABOLIC PANEL
ALT: 13 U/L — ABNORMAL LOW (ref 17–63)
ANION GAP: 7 (ref 5–15)
AST: 20 U/L (ref 15–41)
Albumin: 3.4 g/dL — ABNORMAL LOW (ref 3.5–5.0)
Alkaline Phosphatase: 73 U/L (ref 38–126)
BUN: 19 mg/dL (ref 6–20)
CHLORIDE: 104 mmol/L (ref 101–111)
CO2: 27 mmol/L (ref 22–32)
Calcium: 9 mg/dL (ref 8.9–10.3)
Creatinine, Ser: 0.81 mg/dL (ref 0.61–1.24)
GFR calc non Af Amer: >60 mL/min (ref >60)
Glucose, Bld: 140 mg/dL — ABNORMAL HIGH (ref 65–99)
Potassium: 3.7 mmol/L (ref 3.5–5.1)
SODIUM: 138 mmol/L (ref 135–145)
Total Bilirubin: 1.4 mg/dL — ABNORMAL HIGH (ref 0.3–1.2)
Total Protein: 6.6 g/dL (ref 6.5–8.1)

## 2017-10-27 MED ORDER — PACLITAXEL PROTEIN-BOUND CHEMO INJECTION 100 MG
125.0000 mg/m2 | Freq: Once | INTRAVENOUS | Status: AC
  Administered 2017-10-27: 225 mg via INTRAVENOUS
  Filled 2017-10-27: qty 45

## 2017-10-27 MED ORDER — EMPTY CONTAINERS FLEXIBLE MISC
125.0000 mg/m2 | Freq: Once | Status: DC
  Filled 2017-10-27: qty 45

## 2017-10-27 MED ORDER — SODIUM CHLORIDE 0.9 % IV SOLN
Freq: Once | INTRAVENOUS | Status: AC
Start: 2017-10-27 — End: 2017-10-27
  Administered 2017-10-27: 10:00:00 via INTRAVENOUS
  Filled 2017-10-27: qty 1000

## 2017-10-27 MED ORDER — ONDANSETRON HCL 4 MG PO TABS: 4.0000 mg | ORAL_TABLET | Freq: Three times a day (TID) | ORAL | 2 refills | Status: AC | PRN

## 2017-10-27 MED ORDER — SODIUM CHLORIDE 0.9% FLUSH
10.0000 mL | INTRAVENOUS | Status: DC | PRN
  Filled 2017-10-27: qty 10

## 2017-10-27 MED ORDER — SODIUM CHLORIDE 0.9% FLUSH
10.0000 mL | INTRAVENOUS | Status: DC | PRN
  Administered 2017-10-27: 10 mL via INTRAVENOUS
  Filled 2017-10-27: qty 10

## 2017-10-27 MED ORDER — PROCHLORPERAZINE MALEATE 10 MG PO TABS
10.0000 mg | ORAL_TABLET | Freq: Once | ORAL | Status: AC
  Administered 2017-10-27: 10 mg via ORAL
  Filled 2017-10-27: qty 1

## 2017-10-27 MED ORDER — HEPARIN SOD (PORK) LOCK FLUSH 100 UNIT/ML IV SOLN
500.0000 [IU] | Freq: Once | INTRAVENOUS | Status: AC
  Administered 2017-10-27: 500 [IU] via INTRAVENOUS
  Filled 2017-10-27: qty 5

## 2017-10-27 MED ORDER — HEPARIN SOD (PORK) LOCK FLUSH 100 UNIT/ML IV SOLN: 500.0000 [IU] | Freq: Once | INTRAVENOUS | Status: DC | PRN

## 2017-10-27 MED ORDER — SODIUM CHLORIDE 0.9 % IV SOLN
1400.0000 mg | Freq: Once | INTRAVENOUS | Status: AC
  Administered 2017-10-27: 1400 mg via INTRAVENOUS
  Filled 2017-10-27: qty 10.52

## 2017-10-27 NOTE — Progress Notes (Signed)
Here for follow up

## 2017-10-28 LAB — CANCER ANTIGEN 19-9: CAN 19-9: 394 U/mL — AB (ref 0–35)

## 2017-11-01 NOTE — Progress Notes (Signed)
[place]  [phone] [phone]  ID: Gerald Alvarez. OB: [birth year]  MR#: [id number]  DJT#:[id number]  Patient Care Team: Rusty Aus, MD as PCP - General (Internal Medicine) Clent Jacks, RN as Registered Nurse   CHIEF COMPLAINT: Stage IIB pancreatic adenocarcinoma.  INTERVAL HISTORY: Patient returns to clinic today or further evaluation and consideration of cycle 1, day 8 of gemcitabine and Abraxane.  He was seen in symptom management clinic yesterday for a rash, but states this is mildly improved.  He continues to have occasional nausea, but denies any pain.  He has a fair appetite.  He has no neurologic complaints.  He denies any recent fevers or illnesses.  He has no chest pain or shortness of breath.  He denies any vomiting or diarrhea.  He has no melena or hematochezia.  He has no urinary complaints.  Patient otherwise feels well and offers no further specific complaints.  REVIEW OF SYSTEMS:   Review of Systems  Constitutional: Negative.  Negative for fever, malaise/fatigue and weight loss.  Respiratory: Negative.  Negative for cough and shortness of breath.   Cardiovascular: Negative.  Negative for chest pain and leg swelling.  Gastrointestinal: Positive for constipation and nausea. Negative for abdominal pain, blood in stool, diarrhea and melena.  Genitourinary: Negative.  Negative for dysuria.  Musculoskeletal: Negative.   Skin: Positive for itching and rash.  Neurological: Negative for weakness.  Psychiatric/Behavioral: Negative.  The patient is not nervous/anxious.     As per HPI. Otherwise, a complete review of systems is negative.  PAST MEDICAL HISTORY: Past Medical History:  Diagnosis Date  . BPH (benign prostatic hyperplasia)   . Diabetes mellitus without complication ([place])   . Incontinence   . Nocturia   . OAB (overactive bladder)   . Prostate cancer ([place])     PAST SURGICAL HISTORY: Past Surgical History:    Procedure Laterality Date  . CATARACT EXTRACTION, BILATERAL Bilateral   . CHOLECYSTECTOMY    . ENDOSCOPIC RETROGRADE CHOLANGIOPANCREATOGRAPHY (ERCP) WITH PROPOFOL N/A 09/27/2017   Procedure: ENDOSCOPIC RETROGRADE CHOLANGIOPANCREATOGRAPHY (ERCP) WITH PROPOFOL;  Surgeon: Lucilla Lame, MD;  Location: [facility] ENDOSCOPY;  Service: Endoscopy;  Laterality: N/A;  . HERNIA REPAIR     x 3  . PORTA CATH INSERTION N/A 10/22/2017   Procedure: PORTA CATH INSERTION;  Surgeon: Algernon Huxley, MD;  Location: [facility];  Service: Cardiovascular;  Laterality: N/A;  . PROSTATECTOMY      FAMILY HISTORY: Family History  Problem Relation Age of Onset  . Prostate cancer Brother   . Bladder Cancer Brother   . Pancreatic cancer Brother   . Prostate cancer Brother   . Lymphoma Mother   . Leukemia Father   . Breast cancer Sister   . Uterine cancer Sister   . Kidney cancer Neg Hx     ADVANCED DIRECTIVES (Y/N):  N  HEALTH MAINTENANCE: Social History   Tobacco Use  . Smoking status: Former Smoker    Last attempt to quit: 03/15/1967    Years since quitting: 50.6  . Smokeless tobacco: Former Systems developer    Types: Chew  . Tobacco comment: quit long time  Substance Use Topics  . Alcohol use: No    Alcohol/week: 0.0 oz  . Drug use: No     Colonoscopy:  PAP:  Bone density:  Lipid panel:  Allergies  Allergen Reactions  . Sulfa Antibiotics Nausea And Vomiting    Current Outpatient Medications  Medication Sig Dispense Refill  .  Cholecalciferol (VITAMIN D3) 2000 units capsule Take 2,000 Units by mouth daily.     Marland Kitchen doxazosin (CARDURA) 2 MG tablet Take 2 mg by mouth daily.     . DULoxetine (CYMBALTA) 20 MG capsule Take 1 capsule by mouth daily.    . hydrochlorothiazide (MICROZIDE) 12.5 MG capsule Take 12.5 mg by mouth as needed.    . hydrOXYzine (VISTARIL) 25 MG capsule Take 12.5 mg by mouth 3 (three) times daily as needed for itching.    . lactulose (CHRONULAC) 10 GM/15ML solution Take by mouth 2  (two) times daily.     Marland Kitchen LEVEMIR FLEXTOUCH 100 UNIT/ML Pen Inject 15 Units into the skin daily at 10 pm. (Patient taking differently: Inject 5 Units into the skin daily at 10 pm. ) 15 mL   . lidocaine-prilocaine (EMLA) cream Apply to affected area once 30 g 3  . megestrol (MEGACE) 40 MG tablet Take 40 mg by mouth daily.  0  . Multiple Vitamin (MULTI-VITAMINS) TABS Take 1 tablet by mouth daily.     . pantoprazole (PROTONIX) 40 MG tablet TAKE 1 TABLET TWICE A DAY    . protein supplement shake (PREMIER PROTEIN) LIQD Take 325 mLs (11 oz total) by mouth 2 (two) times daily between meals. 30 Can 0  . sertraline (ZOLOFT) 50 MG tablet Take 1 tablet by mouth daily.    . traZODone (DESYREL) 50 MG tablet Take 50 mg by mouth at bedtime.     . vitamin B-12 (CYANOCOBALAMIN) 1000 MCG tablet Take 1,000 mcg by mouth.     . ondansetron (ZOFRAN) 4 MG tablet Take 1 tablet (4 mg total) by mouth every 8 (eight) hours as needed for nausea or vomiting. (Patient not taking: Reported on 11/03/2017) 30 tablet 2  . prochlorperazine (COMPAZINE) 10 MG tablet Take 1 tablet (10 mg total) by mouth every 6 (six) hours as needed (Nausea or vomiting). (Patient not taking: Reported on 11/03/2017) 60 tablet 2   No current facility-administered medications for this visit.     OBJECTIVE: Vitals:   11/03/17 1004  BP: 138/74  Pulse: 80  Resp: 18  Temp: (!) 96.4 F (35.8 C)     Body mass index is 25.13 kg/m.    ECOG FS:0 - Asymptomatic  General: Well-developed, well-nourished, no acute distress. Eyes: Pink conjunctiva, anicteric sclera. Lungs: Clear to auscultation bilaterally. Heart: Regular rate and rhythm. No rubs, murmurs, or gallops. Abdomen: Soft, nontender, nondistended. No organomegaly noted, normoactive bowel sounds. Musculoskeletal: No edema, cyanosis, or clubbing. Neuro: Alert, answering all questions appropriately. Cranial nerves grossly intact. Skin: Mild maculopapular rash mainly on extremities. Psych: Normal  affect.   LAB RESULTS:  Lab Results  Component Value Date   NA 137 11/03/2017   K 3.4 (L) 11/03/2017   CL 105 11/03/2017   CO2 25 11/03/2017   GLUCOSE 133 (H) 11/03/2017   BUN 27 (H) 11/03/2017   CREATININE 1.03 11/03/2017   CALCIUM 8.7 (L) 11/03/2017   PROT 6.7 11/03/2017   ALBUMIN 3.2 (L) 11/03/2017   AST 19 11/03/2017   ALT 19 11/03/2017   ALKPHOS 57 11/03/2017   BILITOT 0.8 11/03/2017   GFRNONAA >60 11/03/2017   GFRAA >60 11/03/2017    Lab Results  Component Value Date   WBC 4.3 11/03/2017   NEUTROABS 2.7 11/03/2017   HGB 11.8 (L) 11/03/2017   HCT 34.1 (L) 11/03/2017   MCV 103.5 (H) 11/03/2017   PLT 128 (L) 11/03/2017     STUDIES: Nm Pet Image Initial (pi) Skull  Base To Thigh  Result Date: 10/20/2017 CLINICAL DATA:  Initial treatment strategy for new diagnosis of pancreatic cancer. EXAM: NUCLEAR MEDICINE PET SKULL BASE TO THIGH TECHNIQUE: 12.5 mCi F-18 FDG was injected intravenously. Full-ring PET imaging was performed from the skull base to thigh after the radiotracer. CT data was obtained and used for attenuation correction and anatomic localization. FASTING BLOOD GLUCOSE:  Value: 148 mg/dl COMPARISON:  MRI of 09/26/2017. CT of 09/25/2017. Chest CT 01/04/2017. FINDINGS: NECK: No areas of abnormal hypermetabolism. Cerebral atrophy is age appropriate. Bilateral carotid atherosclerosis. No cervical adenopathy. CHEST: Hypermetabolism about the right hilum is without definite CT correlate and favored to be reactive. This measures a S.U.V. max of 3.5, including on image 101/series 3. Mild cardiomegaly with multivessel coronary artery atherosclerosis. Aortic atherosclerosis. Tortuous thoracic aorta. Right hemidiaphragm elevation with right base volume loss and atelectasis. ABDOMEN/PELVIS: Hypermetabolic node along the celiac measures 12 mm and a S.U.V. max of 3.3 on image 147/series 3. Hypermetabolism along the course of the common duct stent in the region of the pancreatic head  and uncinate process. This measures a S.U.V. max of 4.6, including on image 159/series 3. Pneumobilia. Normal adrenal glands. Left renal vascular calcifications. Colonic stool burden suggests constipation. Prostatectomy. SKELETON: No abnormal marrow activity. Osteopenia. Sclerotic lesion in the left iliac is similar to 10/16/2016 and favored to represent a bone island. Osteopenia. Sixth anterior right rib fracture is felt to be new compared to 01/04/2017. Mild compression deformities throughout the thoracic spine, present 01/04/2017. IMPRESSION: 1. Hypermetabolism within the pancreatic head/uncinate process. Likely corresponding to the clinical history of adenocarcinoma. Nonspecific in the setting of a common duct stent. 2. Celiac hypermetabolic lymph node, most consistent with localized nodal metastasis. 3. No distant hypermetabolic metastasis identified. 4. Coronary artery atherosclerosis. Aortic Atherosclerosis (ICD10-I70.0). 5. Right hilar hypermetabolism is without CT correlate and favored to be reactive. Recommend attention on follow-up. Electronically Signed   By: Abigail Miyamoto M.D.   On: 10/20/2017 13:46    ASSESSMENT: Stage IIB pancreatic adenocarcinoma  PLAN:    1.  Stage IIB pancreatic adenocarcinoma: MRI and MRCP results reviewed independently.  Patient recently had EUS at [facility] that confirmed adenocarcinoma.  His Ca 19-9 is now trending down and is 202 also elevated at 386.  Patient is not a surgical candidate, but has agreed to pursue palliative chemotherapy using gemcitabine and Abraxane.  PET scan results from October 20, 2017 reviewed independently and reported as above. Will dose reduce gemcitabine to 800 mg/m given his advanced age. He will receive treatment on days 1, 8, and 15 with day 22 off.  Proceed with cycle 1, day 8 of gemcitabine and Abraxane. Return to clinic in 1 week for consideration of cycle 1, day 15. 2.  Elevated liver enzymes: Resolved.  Patient has a permanent  stent replacement on November 17, 2017. 3.  Nausea: Continue Zofran and compazine as prescribed. 4.  Constipation: Patient does not complain of this today. 5.  Elevated bilirubin: Stent replacement as above.  Monitor. 6.  Rash: Possibly contact dermatitis.  Does not appear related to patient's treatment.  Continue hydroxyzine as prescribed.  Given patient's underlying diabetes would like to avoid steroids if possible. 7.  Poor appetite: Patient was given a referral to dietary.  Approximately 30 minutes was spent in discussion of which great than 50% was consultation.  Patient expressed understanding and was in agreement with this plan. He also understands that He can call clinic at any time with any questions, concerns,  or complaints.   Cancer Staging Pancreatic adenocarcinoma [facility]) Staging form: Exocrine Pancreas, AJCC 8th Edition - Clinical stage from 10/16/2017: Stage IIB (cT3, cN1, cM0) - Signed by Lloyd Huger, MD on 10/25/2017   Lloyd Huger, MD   11/06/2017 9:59 AM

## 2017-11-02 ENCOUNTER — Encounter: Payer: Self-pay | Admitting: Nurse Practitioner

## 2017-11-02 ENCOUNTER — Inpatient Hospital Stay (HOSPITAL_BASED_OUTPATIENT_CLINIC_OR_DEPARTMENT_OTHER): Payer: Medicare Other | Admitting: Nurse Practitioner

## 2017-11-02 ENCOUNTER — Telehealth: Payer: Self-pay

## 2017-11-02 ENCOUNTER — Other Ambulatory Visit: Payer: Self-pay

## 2017-11-02 ENCOUNTER — Telehealth: Payer: Self-pay | Admitting: *Deleted

## 2017-11-02 VITALS — BP 128/64 | HR 87 | Temp 97.5°F | Resp 16 | Ht 64.0 in | Wt 138.7 lb

## 2017-11-02 DIAGNOSIS — R32 Unspecified urinary incontinence: Secondary | ICD-10-CM

## 2017-11-02 DIAGNOSIS — Z87891 Personal history of nicotine dependence: Secondary | ICD-10-CM

## 2017-11-02 DIAGNOSIS — I6523 Occlusion and stenosis of bilateral carotid arteries: Secondary | ICD-10-CM | POA: Diagnosis not present

## 2017-11-02 DIAGNOSIS — Z79899 Other long term (current) drug therapy: Secondary | ICD-10-CM

## 2017-11-02 DIAGNOSIS — Z806 Family history of leukemia: Secondary | ICD-10-CM

## 2017-11-02 DIAGNOSIS — I700 Atherosclerosis of aorta: Secondary | ICD-10-CM | POA: Diagnosis not present

## 2017-11-02 DIAGNOSIS — N400 Enlarged prostate without lower urinary tract symptoms: Secondary | ICD-10-CM

## 2017-11-02 DIAGNOSIS — M8580 Other specified disorders of bone density and structure, unspecified site: Secondary | ICD-10-CM | POA: Diagnosis not present

## 2017-11-02 DIAGNOSIS — Z8042 Family history of malignant neoplasm of prostate: Secondary | ICD-10-CM

## 2017-11-02 DIAGNOSIS — Z803 Family history of malignant neoplasm of breast: Secondary | ICD-10-CM

## 2017-11-02 DIAGNOSIS — R21 Rash and other nonspecific skin eruption: Secondary | ICD-10-CM

## 2017-11-02 DIAGNOSIS — C259 Malignant neoplasm of pancreas, unspecified: Secondary | ICD-10-CM | POA: Diagnosis not present

## 2017-11-02 DIAGNOSIS — K5900 Constipation, unspecified: Secondary | ICD-10-CM

## 2017-11-02 DIAGNOSIS — Z800 Family history of malignant neoplasm of digestive organs: Secondary | ICD-10-CM

## 2017-11-02 DIAGNOSIS — E119 Type 2 diabetes mellitus without complications: Secondary | ICD-10-CM

## 2017-11-02 DIAGNOSIS — Z8052 Family history of malignant neoplasm of bladder: Secondary | ICD-10-CM

## 2017-11-02 NOTE — Telephone Encounter (Signed)
Patient got first chemotherapy last Tuesday and his itching has gotten worse. He has developed a rash on his LLE and his back. Appointment given for this afternoon ar 130 and accepted

## 2017-11-02 NOTE — Progress Notes (Signed)
Patient here for symptom management. He has developed a rash on his back, legs and groin. He reports that the rash is extremely itchy and is relieved by taking a bath. The rash "comes and goes according to patient". He has had a 10lb wt. Loss in the past week. He states his appetite is poor.

## 2017-11-02 NOTE — Telephone Encounter (Signed)
Received a voicemail from daughter, Warren Lacy, on Sunday that Mr. Fuqua was complaining of itching and he has a new rash on one of his legs. Returned call with no answer this am. Instructed on voicemail to always call the main number for the triage nurse but also outside of normal business hours, she can contact the physician on call this way. Oncology Nurse Navigator Documentation  Navigator Location: [facility] (11/02/17 0800)   )Navigator Encounter Type: Telephone (11/02/17 0800) Telephone: Outgoing Call;Patient Update (11/02/17 0800)                                                  Time Spent with Patient: 15 (11/02/17 0800)

## 2017-11-02 NOTE — Progress Notes (Signed)
Symptom Management Consult note [facility]  Telephone:([phone] [phone]  Patient Care Team: Rusty Aus, MD as PCP - General (Internal Medicine) Clent Jacks, RN as Registered Nurse   Name of the patient: Gerald Alvarez  [id number]  11/05/1925   Date of visit: 11/02/17  Diagnosis- Stage IIa pancreatic adenocarcinoma  Chief complaint/ Reason for visit- rash & constipation.   Heme/Onc history:  Patient recently had EUS at [facility] that confirmed adenocarcinoma.  His Ca 19-9 is also elevated at 386.  Patient is not a surgical candidate, but has agreed to pursue palliative chemotherapy using gemcitabine and Abraxane.  Will dose reduce gemcitabine to 800 mg/m given his advanced age.    Interval history-   Rash:  Gerald Alvarez. presents with a rash. Symptoms have been present for 'several month'. The rash is wide spread and occurs on his chest, back, and legs. He is unsure where it was first noticed and says it appears and disappears 'for no reason'. He states that itching is very bothersome but it resolves with showering. Family reports he showers multiple times a day d/t itching. He has been evaluated by PCP for rash and was given hydroxyzine for itching which mildly improves symptoms. He has not tried any topical treatments. Is unsure if he has tried oral steroids. Describes itching as most bothersome. Has not had fever. Rash has not spread to other people and denies other sick contacts.   Patient also reports constipation. Reports bowel movements every 2-3 days and feels incomplete emptying. Reports straining and hard stool. Problem has been chronic and unchanged. Reports having been started on Milk of Magnesia recently. Says this improves his symptoms but wonders if he can continue this medication. Family reports poor intake of fiber, does not exercise. Minimal intake of water. Has taken Miralax once but nothing currently.     ECOG FS:2 - Symptomatic, <50% confined to bed  Review of systems- Review of Systems  Constitutional: Positive for malaise/fatigue. Negative for chills, fever and weight loss.  HENT: Negative for congestion and sore throat.   Eyes: Negative.   Respiratory: Negative.  Negative for stridor.   Cardiovascular: Negative.   Gastrointestinal: Positive for constipation. Negative for abdominal pain, heartburn, nausea and vomiting.  Genitourinary: Positive for urgency.  Musculoskeletal: Negative.   Skin: Positive for itching and rash.  Neurological: Positive for weakness.  Psychiatric/Behavioral: Negative.      Current treatment- s/p cycle 1 gemcitabine + Abraxane (10/27/17)  Allergies  Allergen Reactions  . Sulfa Antibiotics Nausea And Vomiting     Past Medical History:  Diagnosis Date  . BPH (benign prostatic hyperplasia)   . Diabetes mellitus without complication ([place])   . Incontinence   . Nocturia   . OAB (overactive bladder)   . Prostate cancer [facility])     Past Surgical History:  Procedure Laterality Date  . CATARACT EXTRACTION, BILATERAL Bilateral   . CHOLECYSTECTOMY    . ENDOSCOPIC RETROGRADE CHOLANGIOPANCREATOGRAPHY (ERCP) WITH PROPOFOL N/A 09/27/2017   Procedure: ENDOSCOPIC RETROGRADE CHOLANGIOPANCREATOGRAPHY (ERCP) WITH PROPOFOL;  Surgeon: Lucilla Lame, MD;  Location: [facility] ENDOSCOPY;  Service: Endoscopy;  Laterality: N/A;  . HERNIA REPAIR     x 3  . PORTA CATH INSERTION N/A 10/22/2017   Procedure: PORTA CATH INSERTION;  Surgeon: Algernon Huxley, MD;  Location: [place] CV LAB;  Service: Cardiovascular;  Laterality: N/A;  . PROSTATECTOMY      Social History   Socioeconomic History  .  Marital status: Married    Spouse name: Not on file  . Number of children: Not on file  . Years of education: Not on file  . Highest education level: Not on file  Social Needs  . Financial resource strain: Not on file  . Food insecurity - worry: Not on file  . Food insecurity -  inability: Not on file  . Transportation needs - medical: Not on file  . Transportation needs - non-medical: Not on file  Occupational History  . Not on file  Tobacco Use  . Smoking status: Former Smoker    Last attempt to quit: 03/15/1967    Years since quitting: 50.6  . Smokeless tobacco: Former Systems developer    Types: Chew  . Tobacco comment: quit long time  Substance and Sexual Activity  . Alcohol use: No    Alcohol/week: 0.0 oz  . Drug use: No  . Sexual activity: No  Other Topics Concern  . Not on file  Social History Narrative  . Not on file    Family History  Problem Relation Age of Onset  . Prostate cancer Brother   . Bladder Cancer Brother   . Pancreatic cancer Brother   . Prostate cancer Brother   . Lymphoma Mother   . Leukemia Father   . Breast cancer Sister   . Uterine cancer Sister   . Kidney cancer Neg Hx      Current Outpatient Medications:  .  Cholecalciferol (VITAMIN D3) 2000 units capsule, Take 2,000 Units by mouth daily. , Disp: , Rfl:  .  doxazosin (CARDURA) 2 MG tablet, Take 2 mg by mouth daily. , Disp: , Rfl:  .  DULoxetine (CYMBALTA) 20 MG capsule, Take 1 capsule by mouth daily., Disp: , Rfl:  .  hydrochlorothiazide (MICROZIDE) 12.5 MG capsule, Take 12.5 mg by mouth as needed., Disp: , Rfl:  .  hydrOXYzine (VISTARIL) 25 MG capsule, Take 12.5 mg by mouth 3 (three) times daily as needed for itching., Disp: , Rfl:  .  lactulose (CHRONULAC) 10 GM/15ML solution, Take by mouth 2 (two) times daily. , Disp: , Rfl:  .  LEVEMIR FLEXTOUCH 100 UNIT/ML Pen, Inject 15 Units into the skin daily at 10 pm. (Patient taking differently: Inject 5 Units into the skin daily at 10 pm. ), Disp: 15 mL, Rfl:  .  lidocaine-prilocaine (EMLA) cream, Apply to affected area once, Disp: 30 g, Rfl: 3 .  Multiple Vitamin (MULTI-VITAMINS) TABS, Take 1 tablet by mouth daily. , Disp: , Rfl:  .  ondansetron (ZOFRAN) 4 MG tablet, Take 1 tablet (4 mg total) by mouth every 8 (eight) hours as needed  for nausea or vomiting., Disp: 30 tablet, Rfl: 2 .  pantoprazole (PROTONIX) 40 MG tablet, TAKE 1 TABLET TWICE A DAY, Disp: , Rfl:  .  prochlorperazine (COMPAZINE) 10 MG tablet, Take 1 tablet (10 mg total) by mouth every 6 (six) hours as needed (Nausea or vomiting)., Disp: 60 tablet, Rfl: 2 .  protein supplement shake (PREMIER PROTEIN) LIQD, Take 325 mLs (11 oz total) by mouth 2 (two) times daily between meals., Disp: 30 Can, Rfl: 0 .  sertraline (ZOLOFT) 50 MG tablet, Take 1 tablet by mouth daily., Disp: , Rfl:  .  traZODone (DESYREL) 50 MG tablet, Take 50 mg by mouth at bedtime. , Disp: , Rfl:  .  vitamin B-12 (CYANOCOBALAMIN) 1000 MCG tablet, Take 1,000 mcg by mouth. , Disp: , Rfl:  .  megestrol (MEGACE) 40 MG tablet, Take 40 mg  by mouth daily., Disp: , Rfl: 0  Physical exam:  Vitals:   11/02/17 1325 11/02/17 1335  BP:  128/64  Pulse:  87  Resp: 16   Temp:  (!) 97.5 F (36.4 C)  Weight: 138 lb 11.2 oz (62.9 kg)   Height: 5\' 4"  (1.626 m)    Physical Exam  Constitutional:  Elderly, frail gentleman. Accompanied by wife and 2 daughters.   HENT:  Head: Normocephalic and atraumatic.  Eyes: Conjunctivae are normal. Pupils are equal, round, and reactive to light. No scleral icterus.  Neck: Normal range of motion. Neck supple.  Cardiovascular: Normal rate, regular rhythm and normal heart sounds.  Pulmonary/Chest: Effort normal and breath sounds normal. No respiratory distress.  Abdominal: Soft. Bowel sounds are normal. He exhibits no distension.  Musculoskeletal: He exhibits no edema (1+ edema LLE) or deformity.  Neurological: He is alert.  Skin: Skin is warm and dry. Rash noted. No petechiae and no purpura noted. Rash is macular (chest, sides, back, upper inner thighs, LLE red w/ scabbed wound). Rash is not papular and not vesicular. There is pallor.  No lesions on face, hands, or scalp.   Psychiatric: He exhibits abnormal recent memory and abnormal remote memory.     CMP Latest Ref  Rng & Units 10/27/2017  Glucose 65 - 99 mg/dL 140(H)  BUN 6 - 20 mg/dL 19  Creatinine 0.61 - 1.24 mg/dL 0.81  Sodium 135 - 145 mmol/L 138  Potassium 3.5 - 5.1 mmol/L 3.7  Chloride 101 - 111 mmol/L 104  CO2 22 - 32 mmol/L 27  Calcium 8.9 - 10.3 mg/dL 9.0  Total Protein 6.5 - 8.1 g/dL 6.6  Total Bilirubin 0.3 - 1.2 mg/dL 1.4(H)  Alkaline Phos 38 - 126 U/L 73  AST 15 - 41 U/L 20  ALT 17 - 63 U/L 13(L)   CBC Latest Ref Rng & Units 10/27/2017  WBC 3.8 - 10.6 K/uL 6.0  Hemoglobin 13.0 - 18.0 g/dL 13.5  Hematocrit 40.0 - 52.0 % 38.6(L)  Platelets 150 - 440 K/uL 205    No images are attached to the encounter.  Nm Pet Image Initial (pi) Skull Base To Thigh  Result Date: 10/20/2017 CLINICAL DATA:  Initial treatment strategy for new diagnosis of pancreatic cancer. EXAM: NUCLEAR MEDICINE PET SKULL BASE TO THIGH TECHNIQUE: 12.5 mCi F-18 FDG was injected intravenously. Full-ring PET imaging was performed from the skull base to thigh after the radiotracer. CT data was obtained and used for attenuation correction and anatomic localization. FASTING BLOOD GLUCOSE:  Value: 148 mg/dl COMPARISON:  MRI of 09/26/2017. CT of 09/25/2017. Chest CT 01/04/2017. FINDINGS: NECK: No areas of abnormal hypermetabolism. Cerebral atrophy is age appropriate. Bilateral carotid atherosclerosis. No cervical adenopathy. CHEST: Hypermetabolism about the right hilum is without definite CT correlate and favored to be reactive. This measures a S.U.V. max of 3.5, including on image 101/series 3. Mild cardiomegaly with multivessel coronary artery atherosclerosis. Aortic atherosclerosis. Tortuous thoracic aorta. Right hemidiaphragm elevation with right base volume loss and atelectasis. ABDOMEN/PELVIS: Hypermetabolic node along the celiac measures 12 mm and a S.U.V. max of 3.3 on image 147/series 3. Hypermetabolism along the course of the common duct stent in the region of the pancreatic head and uncinate process. This measures a S.U.V.  max of 4.6, including on image 159/series 3. Pneumobilia. Normal adrenal glands. Left renal vascular calcifications. Colonic stool burden suggests constipation. Prostatectomy. SKELETON: No abnormal marrow activity. Osteopenia. Sclerotic lesion in the left iliac is similar to 10/16/2016 and favored to  represent a bone island. Osteopenia. Sixth anterior right rib fracture is felt to be new compared to 01/04/2017. Mild compression deformities throughout the thoracic spine, present 01/04/2017. IMPRESSION: 1. Hypermetabolism within the pancreatic head/uncinate process. Likely corresponding to the clinical history of adenocarcinoma. Nonspecific in the setting of a common duct stent. 2. Celiac hypermetabolic lymph node, most consistent with localized nodal metastasis. 3. No distant hypermetabolic metastasis identified. 4. Coronary artery atherosclerosis. Aortic Atherosclerosis (ICD10-I70.0). 5. Right hilar hypermetabolism is without CT correlate and favored to be reactive. Recommend attention on follow-up. Electronically Signed   By: Abigail Miyamoto M.D.   On: 10/20/2017 13:46     Assessment and plan- Patient is a 84 y.o. male who presents to [facility] for rash and constipation.  1.  Stage IIb pancreatic adenocarcinoma- EUS at [facility] confirmed adenocarcinoma. Ca 19-9 elevated at 386. Patient was deemed not to be a surgical candidate and is currently pursuing palliative chemotherapy.  He is currently status post cycle 1 gemcitabine plus Abraxane.  Gemcitabine dose reduced given patient's advanced age.  Followed by Dr. Grayland Ormond, primary oncologist.  2.  Skin rash-etiology unclear- contact dermatitis? Medication reaction? Chemo reaction?-Patient is poor historian but rash appears chronic and appeared prior to initiating chemotherapy.  Rash only occurs on places where her clothing touches.  Concern for contact dermatitis or allergic reaction.  Given widespread symptoms,  doubtful topical steroids would be  useful.  Patient is diabetic so I would avoid steroids if possible.  Currently on hydroxyzine for pruritus.  Discussed switching to hypoallergenic laundry detergent and soaps.  Could consider evaluation by dermatology and/or allergist. Follow-up with PCP and/or Dr. Grayland Ormond for reevaluation.   3. Constipation- Unclear which medications patient has tried and/or is currently taking. Encouraged oral intake of water and walking as tolerated. Discussed senna and/or Miralax as otc options with goal of 1 BM/day. Could consider daily Linzess if symptoms persist. Follow up with PCP and/or Dr. Grayland Ormond as needed.   Follow-up with Dr. Grayland Ormond for labs and re-evaluation as scheduled.    Visit Diagnosis 1. Pancreatic adenocarcinoma ([place])   2. Macular rash   3. Constipation, unspecified constipation type     Patient expressed understanding and was in agreement with this plan. He also understands that He can call clinic at any time with any questions, concerns, or complaints.  Thank you for allowing me to participate in the care of this very pleasant patient.   Beckey Rutter, DNP, AGNP-C [place] at [facility] [phone] (office) 11/02/17 4:40 PM

## 2017-11-03 ENCOUNTER — Inpatient Hospital Stay (HOSPITAL_BASED_OUTPATIENT_CLINIC_OR_DEPARTMENT_OTHER): Payer: Medicare Other | Admitting: Oncology

## 2017-11-03 ENCOUNTER — Other Ambulatory Visit: Payer: Self-pay

## 2017-11-03 ENCOUNTER — Inpatient Hospital Stay: Payer: Medicare Other

## 2017-11-03 VITALS — BP 138/74 | HR 80 | Temp 96.4°F | Resp 18 | Wt 146.4 lb

## 2017-11-03 DIAGNOSIS — N400 Enlarged prostate without lower urinary tract symptoms: Secondary | ICD-10-CM | POA: Diagnosis not present

## 2017-11-03 DIAGNOSIS — R21 Rash and other nonspecific skin eruption: Secondary | ICD-10-CM

## 2017-11-03 DIAGNOSIS — R948 Abnormal results of function studies of other organs and systems: Secondary | ICD-10-CM

## 2017-11-03 DIAGNOSIS — I6523 Occlusion and stenosis of bilateral carotid arteries: Secondary | ICD-10-CM | POA: Diagnosis not present

## 2017-11-03 DIAGNOSIS — C259 Malignant neoplasm of pancreas, unspecified: Secondary | ICD-10-CM

## 2017-11-03 DIAGNOSIS — I700 Atherosclerosis of aorta: Secondary | ICD-10-CM | POA: Diagnosis not present

## 2017-11-03 DIAGNOSIS — Z801 Family history of malignant neoplasm of trachea, bronchus and lung: Secondary | ICD-10-CM

## 2017-11-03 DIAGNOSIS — Z79899 Other long term (current) drug therapy: Secondary | ICD-10-CM

## 2017-11-03 DIAGNOSIS — R110 Nausea: Secondary | ICD-10-CM | POA: Diagnosis not present

## 2017-11-03 DIAGNOSIS — R32 Unspecified urinary incontinence: Secondary | ICD-10-CM

## 2017-11-03 DIAGNOSIS — Z8042 Family history of malignant neoplasm of prostate: Secondary | ICD-10-CM

## 2017-11-03 DIAGNOSIS — Z87891 Personal history of nicotine dependence: Secondary | ICD-10-CM

## 2017-11-03 DIAGNOSIS — Z806 Family history of leukemia: Secondary | ICD-10-CM

## 2017-11-03 DIAGNOSIS — Z5111 Encounter for antineoplastic chemotherapy: Secondary | ICD-10-CM

## 2017-11-03 DIAGNOSIS — Z8052 Family history of malignant neoplasm of bladder: Secondary | ICD-10-CM

## 2017-11-03 DIAGNOSIS — K5900 Constipation, unspecified: Secondary | ICD-10-CM | POA: Diagnosis not present

## 2017-11-03 DIAGNOSIS — E119 Type 2 diabetes mellitus without complications: Secondary | ICD-10-CM | POA: Diagnosis not present

## 2017-11-03 DIAGNOSIS — Z803 Family history of malignant neoplasm of breast: Secondary | ICD-10-CM

## 2017-11-03 DIAGNOSIS — M8580 Other specified disorders of bone density and structure, unspecified site: Secondary | ICD-10-CM

## 2017-11-03 LAB — COMPREHENSIVE METABOLIC PANEL
ALK PHOS: 57 U/L (ref 38–126)
ALT: 19 U/L (ref 17–63)
ANION GAP: 7 (ref 5–15)
AST: 19 U/L (ref 15–41)
Albumin: 3.2 g/dL — ABNORMAL LOW (ref 3.5–5.0)
BUN: 27 mg/dL — ABNORMAL HIGH (ref 6–20)
CALCIUM: 8.7 mg/dL — AB (ref 8.9–10.3)
CO2: 25 mmol/L (ref 22–32)
Chloride: 105 mmol/L (ref 101–111)
Creatinine, Ser: 1.03 mg/dL (ref 0.61–1.24)
GFR calc non Af Amer: >60 mL/min (ref >60)
Glucose, Bld: 133 mg/dL — ABNORMAL HIGH (ref 65–99)
Potassium: 3.4 mmol/L — ABNORMAL LOW (ref 3.5–5.1)
SODIUM: 137 mmol/L (ref 135–145)
TOTAL PROTEIN: 6.7 g/dL (ref 6.5–8.1)
Total Bilirubin: 0.8 mg/dL (ref 0.3–1.2)

## 2017-11-03 LAB — CBC WITH DIFFERENTIAL/PLATELET
BASOS ABS: 0 10*3/uL (ref 0–0.1)
BASOS PCT: 0 %
Eosinophils Absolute: 0.4 10*3/uL (ref 0–0.7)
Eosinophils Relative: 9 %
HEMATOCRIT: 34.1 % — AB (ref 40.0–52.0)
Hemoglobin: 11.8 g/dL — ABNORMAL LOW (ref 13.0–18.0)
Lymphocytes Relative: 21 %
Lymphs Abs: 0.9 10*3/uL — ABNORMAL LOW (ref 1.0–3.6)
MCH: 35.8 pg — ABNORMAL HIGH (ref 26.0–34.0)
MCHC: 34.6 g/dL (ref 32.0–36.0)
MCV: 103.5 fL — ABNORMAL HIGH (ref 80.0–100.0)
Monocytes Absolute: 0.3 10*3/uL (ref 0.2–1.0)
Monocytes Relative: 7 %
NEUTROS ABS: 2.7 10*3/uL (ref 1.4–6.5)
Neutrophils Relative %: 63 %
Platelets: 128 10*3/uL — ABNORMAL LOW (ref 150–440)
RBC: 3.3 MIL/uL — AB (ref 4.40–5.90)
RDW: 13.5 % (ref 11.5–14.5)
WBC: 4.3 10*3/uL (ref 3.8–10.6)

## 2017-11-03 MED ORDER — SODIUM CHLORIDE 0.9 % IV SOLN
1400.0000 mg | Freq: Once | INTRAVENOUS | Status: AC
  Administered 2017-11-03: 1400 mg via INTRAVENOUS
  Filled 2017-11-03: qty 26.3

## 2017-11-03 MED ORDER — SODIUM CHLORIDE 0.9 % IV SOLN
Freq: Once | INTRAVENOUS | Status: AC
  Administered 2017-11-03: 11:00:00 via INTRAVENOUS
  Filled 2017-11-03: qty 1000

## 2017-11-03 MED ORDER — HEPARIN SOD (PORK) LOCK FLUSH 100 UNIT/ML IV SOLN
500.0000 [IU] | Freq: Once | INTRAVENOUS | Status: AC | PRN
  Administered 2017-11-03: 500 [IU]

## 2017-11-03 MED ORDER — PROCHLORPERAZINE MALEATE 10 MG PO TABS
10.0000 mg | ORAL_TABLET | Freq: Once | ORAL | Status: AC
  Administered 2017-11-03: 10 mg via ORAL
  Filled 2017-11-03: qty 1

## 2017-11-03 MED ORDER — PACLITAXEL PROTEIN-BOUND CHEMO INJECTION 100 MG
125.0000 mg/m2 | Freq: Once | INTRAVENOUS | Status: AC
  Administered 2017-11-03: 225 mg via INTRAVENOUS
  Filled 2017-11-03: qty 45

## 2017-11-03 MED ORDER — PACLITAXEL PROTEIN-BOUND CHEMO INJECTION 100 MG: 125.0000 mg/m2 | Freq: Once | Status: DC

## 2017-11-03 NOTE — Progress Notes (Signed)
Here for follow up. Doing well this am.

## 2017-11-04 LAB — CANCER ANTIGEN 19-9: CA 19-9: 202 U/mL — ABNORMAL HIGH (ref 0–35)

## 2017-11-06 ENCOUNTER — Telehealth: Payer: Self-pay | Admitting: *Deleted

## 2017-11-06 NOTE — Telephone Encounter (Signed)
Keep appt anyway for evaluation.

## 2017-11-06 NOTE — Telephone Encounter (Signed)
[name] called top report that patient just PCP office and has Cellulitis in his l foot and that he was given a shot and antibiotics and told that unless this clears up, he cannot get chemotherapy Tuesday.

## 2017-11-06 NOTE — Telephone Encounter (Signed)
See TF note

## 2017-11-07 NOTE — Progress Notes (Signed)
[place]  Telephone:([phone] [phone]  ID: Gerald Alvarez. OB: 26-Oct-1925  MR#: [id number]  AOZ#:[id number]  Patient Care Team: Rusty Aus, MD as PCP - General (Internal Medicine) Clent Jacks, RN as Registered Nurse   CHIEF COMPLAINT: Stage IIB pancreatic adenocarcinoma.  INTERVAL HISTORY: Patient returns to clinic today or further evaluation and consideration of cycle 1, day 15 of gemcitabine and Abraxane.  He continues to have a rash that improved after receiving steroids from primary care.  He also has a cellulitis on his foot.  He has no neurologic complaints.  He denies any recent fevers or illnesses.  He has no chest pain or shortness of breath.  He denies any nausea, vomiting or diarrhea.  He has occasional constipation.  He has no melena or hematochezia.  He has no urinary complaints.  Patient otherwise feels well and offers no further specific complaints.  REVIEW OF SYSTEMS:   Review of Systems  Constitutional: Negative.  Negative for fever, malaise/fatigue and weight loss.  Respiratory: Negative.  Negative for cough and shortness of breath.   Cardiovascular: Negative.  Negative for chest pain and leg swelling.  Gastrointestinal: Positive for constipation. Negative for abdominal pain, blood in stool, diarrhea, melena and nausea.  Genitourinary: Negative.  Negative for dysuria.  Musculoskeletal: Negative.   Skin: Positive for itching and rash.  Neurological: Negative.  Negative for weakness.  Psychiatric/Behavioral: Negative.  The patient is not nervous/anxious.     As per HPI. Otherwise, a complete review of systems is negative.  PAST MEDICAL HISTORY: Past Medical History:  Diagnosis Date  . BPH (benign prostatic hyperplasia)   . Diabetes mellitus without complication ([place])   . Incontinence   . Nocturia   . OAB (overactive bladder)   . Prostate cancer ([place])     PAST SURGICAL HISTORY: Past Surgical History:  Procedure  Laterality Date  . CATARACT EXTRACTION, BILATERAL Bilateral   . CHOLECYSTECTOMY    . ENDOSCOPIC RETROGRADE CHOLANGIOPANCREATOGRAPHY (ERCP) WITH PROPOFOL N/A 09/27/2017   Procedure: ENDOSCOPIC RETROGRADE CHOLANGIOPANCREATOGRAPHY (ERCP) WITH PROPOFOL;  Surgeon: Lucilla Lame, MD;  Location: [facility] ENDOSCOPY;  Service: Endoscopy;  Laterality: N/A;  . HERNIA REPAIR     x 3  . PORTA CATH INSERTION N/A 10/22/2017   Procedure: PORTA CATH INSERTION;  Surgeon: Algernon Huxley, MD;  Location: [place] CV LAB;  Service: Cardiovascular;  Laterality: N/A;  . PROSTATECTOMY      FAMILY HISTORY: Family History  Problem Relation Age of Onset  . Prostate cancer Brother   . Bladder Cancer Brother   . Pancreatic cancer Brother   . Prostate cancer Brother   . Lymphoma Mother   . Leukemia Father   . Breast cancer Sister   . Uterine cancer Sister   . Kidney cancer Neg Hx     ADVANCED DIRECTIVES (Y/N):  N  HEALTH MAINTENANCE: Social History   Tobacco Use  . Smoking status: Former Smoker    Last attempt to quit: 03/15/1967    Years since quitting: 50.7  . Smokeless tobacco: Former Systems developer    Types: Chew  . Tobacco comment: quit long time  Substance Use Topics  . Alcohol use: No    Alcohol/week: 0.0 oz  . Drug use: No     Colonoscopy:  PAP:  Bone density:  Lipid panel:  Allergies  Allergen Reactions  . Sulfa Antibiotics Nausea And Vomiting    Current Outpatient Medications  Medication Sig Dispense Refill  . Cholecalciferol (VITAMIN D3)  2000 units capsule Take 2,000 Units by mouth daily.     . hydrochlorothiazide (MICROZIDE) 12.5 MG capsule Take 12.5 mg by mouth as needed.    . hydrOXYzine (VISTARIL) 25 MG capsule Take 12.5 mg by mouth 3 (three) times daily as needed for itching.    . lactulose (CHRONULAC) 10 GM/15ML solution Take by mouth 2 (two) times daily.     Marland Kitchen LEVEMIR FLEXTOUCH 100 UNIT/ML Pen Inject 15 Units into the skin daily at 10 pm. (Patient taking differently: Inject 5 Units  into the skin daily at 10 pm. ) 15 mL   . lidocaine-prilocaine (EMLA) cream Apply to affected area once 30 g 3  . megestrol (MEGACE) 40 MG tablet Take 40 mg by mouth daily.  0  . ondansetron (ZOFRAN) 4 MG tablet Take 1 tablet (4 mg total) by mouth every 8 (eight) hours as needed for nausea or vomiting. 30 tablet 2  . prochlorperazine (COMPAZINE) 10 MG tablet Take 1 tablet (10 mg total) by mouth every 6 (six) hours as needed (Nausea or vomiting). 60 tablet 2  . protein supplement shake (PREMIER PROTEIN) LIQD Take 325 mLs (11 oz total) by mouth 2 (two) times daily between meals. 30 Can 0  . traZODone (DESYREL) 50 MG tablet Take 50 mg by mouth at bedtime.     . vitamin B-12 (CYANOCOBALAMIN) 1000 MCG tablet Take 1,000 mcg by mouth.     . doxazosin (CARDURA) 2 MG tablet Take 2 mg by mouth daily.     . DULoxetine (CYMBALTA) 20 MG capsule Take 1 capsule by mouth daily.    . Multiple Vitamin (MULTI-VITAMINS) TABS Take 1 tablet by mouth daily.     . pantoprazole (PROTONIX) 40 MG tablet TAKE 1 TABLET TWICE A DAY    . sertraline (ZOLOFT) 50 MG tablet Take 1 tablet by mouth daily.     No current facility-administered medications for this visit.     OBJECTIVE: Vitals:   11/10/17 1004  BP: 134/68  Pulse: 88  Temp: (!) 97 F (36.1 C)  SpO2: 99%     Body mass index is 25.1 kg/m.    ECOG FS:0 - Asymptomatic  General: Well-developed, well-nourished, no acute distress. Eyes: Pink conjunctiva, anicteric sclera. Lungs: Clear to auscultation bilaterally. Heart: Regular rate and rhythm. No rubs, murmurs, or gallops. Abdomen: Soft, nontender, nondistended. No organomegaly noted, normoactive bowel sounds. Musculoskeletal: No edema, cyanosis, or clubbing. Neuro: Alert, answering all questions appropriately. Cranial nerves grossly intact. Skin: Mild maculopapular rash mainly on torso.  Erythema on left lower extremity consistent with cellulitis. Psych: Normal affect.   LAB RESULTS:  Lab Results    Component Value Date   NA 138 11/10/2017   K 3.4 (L) 11/10/2017   CL 107 11/10/2017   CO2 22 11/10/2017   GLUCOSE 211 (H) 11/10/2017   BUN 24 (H) 11/10/2017   CREATININE 1.12 11/10/2017   CALCIUM 8.1 (L) 11/10/2017   PROT 6.3 (L) 11/10/2017   ALBUMIN 2.7 (L) 11/10/2017   AST 86 (H) 11/10/2017   ALT 66 (H) 11/10/2017   ALKPHOS 228 (H) 11/10/2017   BILITOT 0.5 11/10/2017   GFRNONAA 56 (L) 11/10/2017   GFRAA >60 11/10/2017    Lab Results  Component Value Date   WBC 3.1 (L) 11/10/2017   NEUTROABS 2.0 11/10/2017   HGB 10.7 (L) 11/10/2017   HCT 30.8 (L) 11/10/2017   MCV 104.4 (H) 11/10/2017   PLT 170 11/10/2017     STUDIES: Nm Pet Image Initial (pi) Skull  Base To Thigh  Result Date: 10/20/2017 CLINICAL DATA:  Initial treatment strategy for new diagnosis of pancreatic cancer. EXAM: NUCLEAR MEDICINE PET SKULL BASE TO THIGH TECHNIQUE: 12.5 mCi F-18 FDG was injected intravenously. Full-ring PET imaging was performed from the skull base to thigh after the radiotracer. CT data was obtained and used for attenuation correction and anatomic localization. FASTING BLOOD GLUCOSE:  Value: 148 mg/dl COMPARISON:  MRI of 09/26/2017. CT of 09/25/2017. Chest CT 01/04/2017. FINDINGS: NECK: No areas of abnormal hypermetabolism. Cerebral atrophy is age appropriate. Bilateral carotid atherosclerosis. No cervical adenopathy. CHEST: Hypermetabolism about the right hilum is without definite CT correlate and favored to be reactive. This measures a S.U.V. max of 3.5, including on image 101/series 3. Mild cardiomegaly with multivessel coronary artery atherosclerosis. Aortic atherosclerosis. Tortuous thoracic aorta. Right hemidiaphragm elevation with right base volume loss and atelectasis. ABDOMEN/PELVIS: Hypermetabolic node along the celiac measures 12 mm and a S.U.V. max of 3.3 on image 147/series 3. Hypermetabolism along the course of the common duct stent in the region of the pancreatic head and uncinate  process. This measures a S.U.V. max of 4.6, including on image 159/series 3. Pneumobilia. Normal adrenal glands. Left renal vascular calcifications. Colonic stool burden suggests constipation. Prostatectomy. SKELETON: No abnormal marrow activity. Osteopenia. Sclerotic lesion in the left iliac is similar to 10/16/2016 and favored to represent a bone island. Osteopenia. Sixth anterior right rib fracture is felt to be new compared to 01/04/2017. Mild compression deformities throughout the thoracic spine, present 01/04/2017. IMPRESSION: 1. Hypermetabolism within the pancreatic head/uncinate process. Likely corresponding to the clinical history of adenocarcinoma. Nonspecific in the setting of a common duct stent. 2. Celiac hypermetabolic lymph node, most consistent with localized nodal metastasis. 3. No distant hypermetabolic metastasis identified. 4. Coronary artery atherosclerosis. Aortic Atherosclerosis (ICD10-I70.0). 5. Right hilar hypermetabolism is without CT correlate and favored to be reactive. Recommend attention on follow-up. Electronically Signed   By: Abigail Miyamoto M.D.   On: 10/20/2017 13:46    ASSESSMENT: Stage IIB pancreatic adenocarcinoma  PLAN:    1.  Stage IIB pancreatic adenocarcinoma: MRI and MRCP results reviewed independently.  Patient recently had EUS at [facility] that confirmed adenocarcinoma.  His Ca 19-9 is now trending down and is 150 also elevated at 386.  Patient is not a surgical candidate, but has agreed to pursue palliative chemotherapy using gemcitabine and Abraxane.  PET scan results from October 20, 2017 reviewed independently and reported as above. Will dose hold gemcitabine given his rash. He will receive treatment on days 1, 8, and 15 with day 22 off.  Proceed with cycle 1, day 15 of Abraxane only. Return to clinic in 2 weeks for consideration of cycle 2, day 1. 2.  Elevated liver enzymes: Mild, monitor.  Patient has a permanent stent replacement on November 17, 2017. 3.   Nausea: Continue Zofran and compazine as prescribed. 4.  Constipation: Patient does not complain of this today. 5.  Elevated bilirubin: Resolved.  Stent replacement as above.  Monitor. 6.  Rash: Possibly related to gemcitabine.  Hold gemcitabine as above. Continue hydroxyzine as prescribed.  Given patient's underlying diabetes would like to avoid steroids if possible. 7.  Poor appetite: Appreciate dietary input.  Patient was also given a prescription for Megace today.  Approximately 30 minutes was spent in discussion of which great than 50% was consultation.  Patient expressed understanding and was in agreement with this plan. He also understands that He can call clinic at any time with any questions,  concerns, or complaints.   Cancer Staging Pancreatic adenocarcinoma [facility]) Staging form: Exocrine Pancreas, AJCC 8th Edition - Clinical stage from 10/16/2017: Stage IIB (cT3, cN1, cM0) - Signed by Lloyd Huger, MD on 10/25/2017   Lloyd Huger, MD   11/13/2017 9:06 AM

## 2017-11-09 ENCOUNTER — Emergency Department
Admission: EM | Admit: 2017-11-09 | Discharge: 2017-11-09 | Disposition: A | Discharge status: Home / Self Care | Payer: Medicare Other | Attending: Emergency Medicine | Admitting: Emergency Medicine

## 2017-11-09 ENCOUNTER — Encounter: Payer: Self-pay | Admitting: Emergency Medicine

## 2017-11-09 ENCOUNTER — Other Ambulatory Visit: Payer: Self-pay

## 2017-11-09 DIAGNOSIS — E876 Hypokalemia: Secondary | ICD-10-CM | POA: Insufficient documentation

## 2017-11-09 DIAGNOSIS — Z87891 Personal history of nicotine dependence: Secondary | ICD-10-CM | POA: Diagnosis not present

## 2017-11-09 DIAGNOSIS — Z8546 Personal history of malignant neoplasm of prostate: Secondary | ICD-10-CM | POA: Diagnosis not present

## 2017-11-09 DIAGNOSIS — E119 Type 2 diabetes mellitus without complications: Secondary | ICD-10-CM | POA: Insufficient documentation

## 2017-11-09 DIAGNOSIS — R531 Weakness: Secondary | ICD-10-CM

## 2017-11-09 DIAGNOSIS — Z79899 Other long term (current) drug therapy: Secondary | ICD-10-CM | POA: Diagnosis not present

## 2017-11-09 DIAGNOSIS — E860 Dehydration: Secondary | ICD-10-CM | POA: Diagnosis not present

## 2017-11-09 LAB — URINALYSIS, COMPLETE (UACMP) WITH MICROSCOPIC
BACTERIA UA: NONE SEEN
BILIRUBIN URINE: NEGATIVE
Glucose, UA: NEGATIVE mg/dL
Hgb urine dipstick: NEGATIVE
KETONES UR: NEGATIVE mg/dL
Leukocytes, UA: NEGATIVE
Nitrite: NEGATIVE
PROTEIN: 30 mg/dL — AB
Specific Gravity, Urine: 1.027 (ref 1.005–1.030)
pH: 5 (ref 5.0–8.0)

## 2017-11-09 LAB — COMPREHENSIVE METABOLIC PANEL
ALT: 49 U/L (ref 17–63)
AST: 75 U/L — ABNORMAL HIGH (ref 15–41)
Albumin: 2.8 g/dL — ABNORMAL LOW (ref 3.5–5.0)
Alkaline Phosphatase: 201 U/L — ABNORMAL HIGH (ref 38–126)
Anion gap: 9 (ref 5–15)
BUN: 26 mg/dL — ABNORMAL HIGH (ref 6–20)
CO2: 25 mmol/L (ref 22–32)
Calcium: 8.4 mg/dL — ABNORMAL LOW (ref 8.9–10.3)
Chloride: 104 mmol/L (ref 101–111)
Creatinine, Ser: 1.13 mg/dL (ref 0.61–1.24)
GFR, EST NON AFRICAN AMERICAN: 56 mL/min — AB (ref >60)
Glucose, Bld: 159 mg/dL — ABNORMAL HIGH (ref 65–99)
POTASSIUM: 3.1 mmol/L — AB (ref 3.5–5.1)
SODIUM: 138 mmol/L (ref 135–145)
Total Bilirubin: 0.8 mg/dL (ref 0.3–1.2)
Total Protein: 6.8 g/dL (ref 6.5–8.1)

## 2017-11-09 LAB — CBC
HEMATOCRIT: 33.8 % — AB (ref 40.0–52.0)
HEMOGLOBIN: 11.4 g/dL — AB (ref 13.0–18.0)
MCH: 35 pg — AB (ref 26.0–34.0)
MCHC: 33.7 g/dL (ref 32.0–36.0)
MCV: 103.8 fL — AB (ref 80.0–100.0)
PLATELETS: 178 10*3/uL (ref 150–440)
RBC: 3.26 MIL/uL — AB (ref 4.40–5.90)
RDW: 13.6 % (ref 11.5–14.5)
WBC: 3.3 10*3/uL — ABNORMAL LOW (ref 3.8–10.6)

## 2017-11-09 LAB — TROPONIN I

## 2017-11-09 LAB — GLUCOSE, CAPILLARY: GLUCOSE-CAPILLARY: 86 mg/dL (ref 65–99)

## 2017-11-09 MED ORDER — POTASSIUM CHLORIDE CRYS ER 20 MEQ PO TBCR
40.0000 meq | EXTENDED_RELEASE_TABLET | Freq: Once | ORAL | Status: AC
Start: 2017-11-09 — End: 2017-11-09
  Administered 2017-11-09: 40 meq via ORAL
  Filled 2017-11-09: qty 2

## 2017-11-09 MED ORDER — SODIUM CHLORIDE 0.9 % IV BOLUS (SEPSIS)
500.0000 mL | Freq: Once | INTRAVENOUS | Status: AC
  Administered 2017-11-09: 500 mL via INTRAVENOUS

## 2017-11-09 NOTE — ED Provider Notes (Signed)
[facility] Emergency Department Provider Note  ____________________________________________   I have reviewed the triage vital signs and the nursing notes.   HISTORY  Chief Complaint Weakness   History limited by: Not Limited   HPI Gerald Alvarez. is a 84 y.o. male who presents to the emergency department today because of family concern for possible dehydration. The patient has recently started chemotherapy for pancreatic cancer. Today the patient did not feel like eating or drinking. He denies any abdominal pain or nausea. States food does not taste good. Family is concerned that patient might be dehydrated.    Per medical record review patient has a history of DM, pancreatic adenocarcinoma.   Past Medical History:  Diagnosis Date  . BPH (benign prostatic hyperplasia)   . Diabetes mellitus without complication ([place])   . Incontinence   . Nocturia   . OAB (overactive bladder)   . Prostate cancer [facility])     Patient Active Problem List   Diagnosis Date Noted  . Pancreatic adenocarcinoma ([place]) 10/13/2017  . Malnutrition of moderate degree 09/28/2017  . Common bile duct obstruction   . Pancreatic mass   . Jaundice 09/25/2017  . Low serum vitamin D 03/05/2017  . Adult idiopathic generalized osteoporosis 01/19/2017  . Vertebral compression fracture, with routine healing, subsequent encounter 01/19/2017  . B12 deficiency 10/23/2016  . Medicare annual wellness visit, initial 09/04/2016  . DD (diverticular disease) 03/10/2016  . Calculus of kidney 03/10/2016  . Bilateral tinnitus 03/10/2016  . Cardiomyopathy ([place]) 08/25/2014  . H/O malignant neoplasm of prostate 08/25/2014    Past Surgical History:  Procedure Laterality Date  . CATARACT EXTRACTION, BILATERAL Bilateral   . CHOLECYSTECTOMY    . ENDOSCOPIC RETROGRADE CHOLANGIOPANCREATOGRAPHY (ERCP) WITH PROPOFOL N/A 09/27/2017   Procedure: ENDOSCOPIC RETROGRADE CHOLANGIOPANCREATOGRAPHY (ERCP) WITH  PROPOFOL;  Surgeon: Lucilla Lame, MD;  Location: [facility] ENDOSCOPY;  Service: Endoscopy;  Laterality: N/A;  . HERNIA REPAIR     x 3  . PORTA CATH INSERTION N/A 10/22/2017   Procedure: PORTA CATH INSERTION;  Surgeon: Algernon Huxley, MD;  Location: [place] CV LAB;  Service: Cardiovascular;  Laterality: N/A;  . PROSTATECTOMY      Prior to Admission medications   Medication Sig Start Date End Date Taking? Authorizing Provider  Cholecalciferol (VITAMIN D3) 2000 units capsule Take 2,000 Units by mouth daily.  03/05/17   [provider]  doxazosin (CARDURA) 2 MG tablet Take 2 mg by mouth daily.  12/31/16 12/31/17  [provider]  DULoxetine (CYMBALTA) 20 MG capsule Take 1 capsule by mouth daily. 09/12/17   [provider]  hydrochlorothiazide (MICROZIDE) 12.5 MG capsule Take 12.5 mg by mouth as needed.    [provider]  hydrOXYzine (VISTARIL) 25 MG capsule Take 12.5 mg by mouth 3 (three) times daily as needed for itching.    [provider]  lactulose (CHRONULAC) 10 GM/15ML solution Take by mouth 2 (two) times daily.     [provider]  LEVEMIR FLEXTOUCH 100 UNIT/ML Pen Inject 15 Units into the skin daily at 10 pm. Patient taking differently: Inject 5 Units into the skin daily at 10 pm.  09/28/17   Loletha Grayer, MD  lidocaine-prilocaine (EMLA) cream Apply to affected area once 10/16/17   Lloyd Huger, MD  megestrol (MEGACE) 40 MG tablet Take 40 mg by mouth daily. 10/30/17   [provider]  Multiple Vitamin (MULTI-VITAMINS) TABS Take 1 tablet by mouth daily.     [provider]  ondansetron (ZOFRAN) 4 MG tablet Take 1 tablet (4 mg total) by mouth every 8 (eight) hours as needed for nausea or vomiting. Patient not taking: Reported on 11/03/2017 10/27/17   Lloyd Huger, MD  pantoprazole (PROTONIX) 40 MG tablet TAKE 1 TABLET TWICE A DAY 02/08/15   [provider]  prochlorperazine (COMPAZINE) 10 MG tablet Take 1  tablet (10 mg total) by mouth every 6 (six) hours as needed (Nausea or vomiting). Patient not taking: Reported on 11/03/2017 10/16/17   Lloyd Huger, MD  protein supplement shake (PREMIER PROTEIN) LIQD Take 325 mLs (11 oz total) by mouth 2 (two) times daily between meals. 09/28/17   Loletha Grayer, MD  sertraline (ZOLOFT) 50 MG tablet Take 1 tablet by mouth daily. 07/14/17   [provider]  traZODone (DESYREL) 50 MG tablet Take 50 mg by mouth at bedtime.  12/29/16   [provider]  vitamin B-12 (CYANOCOBALAMIN) 1000 MCG tablet Take 1,000 mcg by mouth.     [provider]    Allergies Sulfa antibiotics  Family History  Problem Relation Age of Onset  . Prostate cancer Brother   . Bladder Cancer Brother   . Pancreatic cancer Brother   . Prostate cancer Brother   . Lymphoma Mother   . Leukemia Father   . Breast cancer Sister   . Uterine cancer Sister   . Kidney cancer Neg Hx     Social History Social History   Tobacco Use  . Smoking status: Former Smoker    Last attempt to quit: 03/15/1967    Years since quitting: 50.6  . Smokeless tobacco: Former Systems developer    Types: Chew  . Tobacco comment: quit long time  Substance Use Topics  . Alcohol use: No    Alcohol/week: 0.0 oz  . Drug use: No    Review of Systems Constitutional: No fever/chills Eyes: No visual changes. ENT: No sore throat. Cardiovascular: Denies chest pain. Respiratory: Denies shortness of breath. Gastrointestinal: No abdominal pain.  No nausea, no vomiting.  No diarrhea.   Genitourinary: Negative for dysuria. Musculoskeletal: Negative for back pain. Skin: Negative for rash. Neurological: Negative for headaches, focal weakness or numbness.  ____________________________________________   PHYSICAL EXAM:  VITAL SIGNS: ED Triage Vitals  Enc Vitals Group     BP 11/09/17 1801 (!) 147/64     Pulse Rate 11/09/17 1801 92     Resp 11/09/17 2030 14     Temp 11/09/17 1801 98.2 F (36.8  C)     Temp Source 11/09/17 1801 Oral     SpO2 11/09/17 1801 99 %     Weight 11/09/17 1800 147 lb (66.7 kg)     Height 11/09/17 1800 5\' 4"  (1.626 m)   Constitutional: Alert and oriented. Well appearing and in no distress. Eyes: Conjunctivae are normal.  ENT   Head: Normocephalic and atraumatic.   Nose: No congestion/rhinnorhea.   Mouth/Throat: Mucous membranes are moist.   Neck: No stridor. Hematological/Lymphatic/Immunilogical: No cervical lymphadenopathy. Cardiovascular: Normal rate, regular rhythm.  No murmurs, rubs, or gallops.  Respiratory: Normal respiratory effort without tachypnea nor retractions. Breath sounds are clear and equal bilaterally. No wheezes/rales/rhonchi. Gastrointestinal: Soft and non tender. No rebound. No guarding.  Genitourinary: Deferred Musculoskeletal: Normal range of motion in all extremities. No lower extremity edema. Neurologic:  Normal speech and language. No gross focal neurologic deficits are appreciated.  Skin:  Skin is warm, dry and intact. No rash noted. Psychiatric: Mood and affect are normal. Speech and behavior  are normal. Patient exhibits appropriate insight and judgment.  ____________________________________________    LABS (pertinent positives/negatives)  UA not consistent with infection Trop <0.03 CBC wbc 3.3, hgb 11.4, plt 178 CMP na 138, k 3.1, cr 1.13  ____________________________________________   EKG  I, Nance Pear, attending physician, personally viewed and interpreted this EKG  EKG Time: 1822 Rate: 92 Rhythm: sinus rhythm with PVC Axis: left axis deviation Intervals: qtc 467 QRS: LAFB, LVH ST changes: no st elevation Impression: abnormal ekg   ____________________________________________    RADIOLOGY  None  ____________________________________________   PROCEDURES  Procedures  ____________________________________________   INITIAL IMPRESSION / ASSESSMENT AND PLAN / ED  COURSE  Pertinent labs & imaging results that were available during my care of the patient were reviewed by me and considered in my medical decision making (see chart for details).  Patient brought into the emergency department accompanied by family because of concerns for not eating and drinking as much today.  Blood work with the overly concerning findings, potassium mildly low which would be consistent with poor oral intake.  Patient was given IV fluids and did feel better.  Discussed with family importance of primary care follow-up.  ____________________________________________   FINAL CLINICAL IMPRESSION(S) / ED DIAGNOSES  Final diagnoses:  Weakness  Dehydration  Hypokalemia     Note: This dictation was prepared with Dragon dictation. Any transcriptional errors that result from this process are unintentional     Nance Pear, MD 11/10/17 1534

## 2017-11-09 NOTE — Discharge Instructions (Signed)
Please seek medical attention for any high fevers, chest pain, shortness of breath, change in behavior, persistent vomiting, bloody stool or any other new or concerning symptoms.  

## 2017-11-09 NOTE — ED Notes (Signed)
Family at bedside. 

## 2017-11-09 NOTE — ED Notes (Addendum)
[name] family to STAT desk to inquire over wait time; informed family that pt should be going to next available exam room based on his hx; family voices understanding & charge nurse notified of pt's hx & c/o; pt remains in family room for comfort

## 2017-11-09 NOTE — ED Notes (Signed)
Pt given [place] ice and sprite mixed. Family gave him 2 chicken nuggets. Pt took a few bites of applesauce.

## 2017-11-09 NOTE — ED Notes (Addendum)
Pt to the er for loss of appetite, and possible dehydration. Pt has cellulitis in the left leg and got an antibiotic injection, and sent home with antibiotic and has 8 tabs remaining. Pt saw Dr Sabra Heck for rash over abdomen. Dx contact dermatitis and given a tab for itching. Possible reaction to megace that he takes for appetite. Was told to stop the megace in case it was a reaction to that. Cancer MD states he thinks it may be chemo related. Chemo was started 3 weeks ago. Tomorrow is the third treatment. Also scheduled to have a permanent stent placed in the bile duct. Pt is not vomiting. He has no appetite.

## 2017-11-09 NOTE — ED Notes (Signed)
Pt up to the toilet to urinate. 

## 2017-11-09 NOTE — ED Triage Notes (Signed)
Here today for generalized weakness and diarrhea.  Is a cancer pt on chemo. Last received chemo Tuesday of last week.  Supposed to receive chemo tomorrow.  Decreased appetite. No vomiting. Denies pain. On abx for cellulitis of RLE that is improving per family.  Has stent to bile duct currently and is supposed to have permanent one placed.

## 2017-11-10 ENCOUNTER — Inpatient Hospital Stay: Payer: Medicare Other | Attending: Oncology

## 2017-11-10 ENCOUNTER — Encounter: Payer: Self-pay | Admitting: Oncology

## 2017-11-10 ENCOUNTER — Ambulatory Visit: Payer: Medicare Other

## 2017-11-10 ENCOUNTER — Inpatient Hospital Stay: Payer: Medicare Other

## 2017-11-10 ENCOUNTER — Inpatient Hospital Stay (HOSPITAL_BASED_OUTPATIENT_CLINIC_OR_DEPARTMENT_OTHER): Payer: Medicare Other | Admitting: Oncology

## 2017-11-10 ENCOUNTER — Ambulatory Visit: Payer: Medicare Other | Admitting: Oncology

## 2017-11-10 ENCOUNTER — Other Ambulatory Visit: Payer: Medicare Other

## 2017-11-10 VITALS — BP 134/68 | HR 88 | Temp 97.0°F | Ht 64.0 in | Wt 146.2 lb

## 2017-11-10 DIAGNOSIS — R748 Abnormal levels of other serum enzymes: Secondary | ICD-10-CM

## 2017-11-10 DIAGNOSIS — C801 Malignant (primary) neoplasm, unspecified: Secondary | ICD-10-CM

## 2017-11-10 DIAGNOSIS — L03119 Cellulitis of unspecified part of limb: Secondary | ICD-10-CM | POA: Diagnosis not present

## 2017-11-10 DIAGNOSIS — C259 Malignant neoplasm of pancreas, unspecified: Secondary | ICD-10-CM

## 2017-11-10 DIAGNOSIS — I2510 Atherosclerotic heart disease of native coronary artery without angina pectoris: Secondary | ICD-10-CM | POA: Diagnosis not present

## 2017-11-10 DIAGNOSIS — I517 Cardiomegaly: Secondary | ICD-10-CM | POA: Diagnosis not present

## 2017-11-10 DIAGNOSIS — N400 Enlarged prostate without lower urinary tract symptoms: Secondary | ICD-10-CM | POA: Insufficient documentation

## 2017-11-10 DIAGNOSIS — Z5111 Encounter for antineoplastic chemotherapy: Secondary | ICD-10-CM | POA: Insufficient documentation

## 2017-11-10 DIAGNOSIS — E119 Type 2 diabetes mellitus without complications: Secondary | ICD-10-CM | POA: Diagnosis not present

## 2017-11-10 DIAGNOSIS — I700 Atherosclerosis of aorta: Secondary | ICD-10-CM | POA: Diagnosis not present

## 2017-11-10 DIAGNOSIS — N3944 Nocturnal enuresis: Secondary | ICD-10-CM

## 2017-11-10 DIAGNOSIS — K5900 Constipation, unspecified: Secondary | ICD-10-CM | POA: Insufficient documentation

## 2017-11-10 DIAGNOSIS — Z806 Family history of leukemia: Secondary | ICD-10-CM

## 2017-11-10 DIAGNOSIS — Z803 Family history of malignant neoplasm of breast: Secondary | ICD-10-CM | POA: Insufficient documentation

## 2017-11-10 DIAGNOSIS — M8580 Other specified disorders of bone density and structure, unspecified site: Secondary | ICD-10-CM

## 2017-11-10 DIAGNOSIS — Z87891 Personal history of nicotine dependence: Secondary | ICD-10-CM | POA: Insufficient documentation

## 2017-11-10 DIAGNOSIS — Z8546 Personal history of malignant neoplasm of prostate: Secondary | ICD-10-CM | POA: Diagnosis not present

## 2017-11-10 DIAGNOSIS — F419 Anxiety disorder, unspecified: Secondary | ICD-10-CM | POA: Insufficient documentation

## 2017-11-10 DIAGNOSIS — Z79899 Other long term (current) drug therapy: Secondary | ICD-10-CM | POA: Insufficient documentation

## 2017-11-10 DIAGNOSIS — Z8042 Family history of malignant neoplasm of prostate: Secondary | ICD-10-CM | POA: Insufficient documentation

## 2017-11-10 DIAGNOSIS — Z8052 Family history of malignant neoplasm of bladder: Secondary | ICD-10-CM | POA: Diagnosis not present

## 2017-11-10 DIAGNOSIS — R21 Rash and other nonspecific skin eruption: Secondary | ICD-10-CM | POA: Diagnosis not present

## 2017-11-10 DIAGNOSIS — R110 Nausea: Secondary | ICD-10-CM | POA: Insufficient documentation

## 2017-11-10 LAB — CBC WITH DIFFERENTIAL/PLATELET
BASOS ABS: 0 10*3/uL (ref 0–0.1)
BASOS PCT: 1 %
EOS PCT: 4 %
Eosinophils Absolute: 0.1 10*3/uL (ref 0–0.7)
HCT: 30.8 % — ABNORMAL LOW (ref 40.0–52.0)
Hemoglobin: 10.7 g/dL — ABNORMAL LOW (ref 13.0–18.0)
Lymphocytes Relative: 21 %
Lymphs Abs: 0.7 10*3/uL — ABNORMAL LOW (ref 1.0–3.6)
MCH: 36.2 pg — ABNORMAL HIGH (ref 26.0–34.0)
MCHC: 34.7 g/dL (ref 32.0–36.0)
MCV: 104.4 fL — AB (ref 80.0–100.0)
MONO ABS: 0.3 10*3/uL (ref 0.2–1.0)
Monocytes Relative: 11 %
Neutro Abs: 2 10*3/uL (ref 1.4–6.5)
Neutrophils Relative %: 63 %
PLATELETS: 170 10*3/uL (ref 150–440)
RBC: 2.95 MIL/uL — ABNORMAL LOW (ref 4.40–5.90)
RDW: 13.7 % (ref 11.5–14.5)
WBC: 3.1 10*3/uL — ABNORMAL LOW (ref 3.8–10.6)

## 2017-11-10 LAB — COMPREHENSIVE METABOLIC PANEL
ALBUMIN: 2.7 g/dL — AB (ref 3.5–5.0)
ALK PHOS: 228 U/L — AB (ref 38–126)
ALT: 66 U/L — ABNORMAL HIGH (ref 17–63)
AST: 86 U/L — AB (ref 15–41)
Anion gap: 9 (ref 5–15)
BILIRUBIN TOTAL: 0.5 mg/dL (ref 0.3–1.2)
BUN: 24 mg/dL — AB (ref 6–20)
CALCIUM: 8.1 mg/dL — AB (ref 8.9–10.3)
CO2: 22 mmol/L (ref 22–32)
CREATININE: 1.12 mg/dL (ref 0.61–1.24)
Chloride: 107 mmol/L (ref 101–111)
GFR calc Af Amer: >60 mL/min (ref >60)
GFR calc non Af Amer: 56 mL/min — ABNORMAL LOW (ref >60)
GLUCOSE: 211 mg/dL — AB (ref 65–99)
Potassium: 3.4 mmol/L — ABNORMAL LOW (ref 3.5–5.1)
Sodium: 138 mmol/L (ref 135–145)
TOTAL PROTEIN: 6.3 g/dL — AB (ref 6.5–8.1)

## 2017-11-10 MED ORDER — SODIUM CHLORIDE 0.9 % IV SOLN
Freq: Once | INTRAVENOUS | Status: AC
  Administered 2017-11-10: 11:00:00 via INTRAVENOUS
  Filled 2017-11-10: qty 1000

## 2017-11-10 MED ORDER — PACLITAXEL PROTEIN-BOUND CHEMO INJECTION 100 MG
125.0000 mg/m2 | Freq: Once | Status: DC
  Filled 2017-11-10: qty 45

## 2017-11-10 MED ORDER — PROCHLORPERAZINE MALEATE 10 MG PO TABS
10.0000 mg | ORAL_TABLET | Freq: Once | ORAL | Status: AC
  Administered 2017-11-10: 10 mg via ORAL
  Filled 2017-11-10: qty 1

## 2017-11-10 MED ORDER — PACLITAXEL PROTEIN-BOUND CHEMO INJECTION 100 MG
125.0000 mg/m2 | Freq: Once | INTRAVENOUS | Status: AC
  Administered 2017-11-10: 225 mg via INTRAVENOUS
  Filled 2017-11-10: qty 45

## 2017-11-10 MED ORDER — HEPARIN SOD (PORK) LOCK FLUSH 100 UNIT/ML IV SOLN
500.0000 [IU] | Freq: Once | INTRAVENOUS | Status: AC
  Administered 2017-11-10: 500 [IU] via INTRAVENOUS
  Filled 2017-11-10: qty 5

## 2017-11-10 NOTE — Progress Notes (Signed)
Cellulitis noted to LLE, rash noted to torsol

## 2017-11-10 NOTE — Progress Notes (Signed)
Patient receiving Abraxane only today per Dr. Grayland Ormond.

## 2017-11-11 LAB — CANCER ANTIGEN 19-9: CA 19-9: 150 U/mL — ABNORMAL HIGH (ref 0–35)

## 2017-11-17 ENCOUNTER — Encounter
Admission: RE | Disposition: A | Discharge status: Home / Self Care | Payer: Self-pay | Source: Ambulatory Visit | Attending: Gastroenterology

## 2017-11-17 ENCOUNTER — Ambulatory Visit
Admission: RE | Admit: 2017-11-17 | Discharge: 2017-11-17 | Disposition: A | Discharge status: Home / Self Care | Payer: Medicare Other | Source: Ambulatory Visit | Attending: Gastroenterology | Admitting: Gastroenterology

## 2017-11-17 ENCOUNTER — Ambulatory Visit: Payer: Medicare Other | Admitting: Anesthesiology

## 2017-11-17 ENCOUNTER — Ambulatory Visit: Payer: Medicare Other

## 2017-11-17 ENCOUNTER — Encounter: Payer: Self-pay | Admitting: *Deleted

## 2017-11-17 DIAGNOSIS — K8689 Other specified diseases of pancreas: Secondary | ICD-10-CM

## 2017-11-17 DIAGNOSIS — Z87891 Personal history of nicotine dependence: Secondary | ICD-10-CM | POA: Insufficient documentation

## 2017-11-17 DIAGNOSIS — K831 Obstruction of bile duct: Secondary | ICD-10-CM | POA: Insufficient documentation

## 2017-11-17 DIAGNOSIS — N3281 Overactive bladder: Secondary | ICD-10-CM | POA: Insufficient documentation

## 2017-11-17 DIAGNOSIS — E119 Type 2 diabetes mellitus without complications: Secondary | ICD-10-CM | POA: Insufficient documentation

## 2017-11-17 DIAGNOSIS — Z4659 Encounter for fitting and adjustment of other gastrointestinal appliance and device: Secondary | ICD-10-CM | POA: Insufficient documentation

## 2017-11-17 DIAGNOSIS — Z8546 Personal history of malignant neoplasm of prostate: Secondary | ICD-10-CM | POA: Insufficient documentation

## 2017-11-17 DIAGNOSIS — N400 Enlarged prostate without lower urinary tract symptoms: Secondary | ICD-10-CM | POA: Diagnosis not present

## 2017-11-17 DIAGNOSIS — Z79899 Other long term (current) drug therapy: Secondary | ICD-10-CM | POA: Insufficient documentation

## 2017-11-17 DIAGNOSIS — Z794 Long term (current) use of insulin: Secondary | ICD-10-CM | POA: Insufficient documentation

## 2017-11-17 HISTORY — PX: ERCP: SHX5425

## 2017-11-17 HISTORY — DX: Other specified diseases of pancreas: K86.89

## 2017-11-17 LAB — GLUCOSE, CAPILLARY
GLUCOSE-CAPILLARY: 111 mg/dL — AB (ref 65–99)
Glucose-Capillary: 99 mg/dL (ref 65–99)

## 2017-11-17 SURGERY — ERCP, WITH INTERVENTION IF INDICATED
Anesthesia: General

## 2017-11-17 MED ORDER — GLYCOPYRROLATE 0.2 MG/ML IJ SOLN
INTRAMUSCULAR | Status: DC | PRN
  Administered 2017-11-17 (×2): 0.1 mg via INTRAVENOUS

## 2017-11-17 MED ORDER — FENTANYL CITRATE (PF) 100 MCG/2ML IJ SOLN
INTRAMUSCULAR | Status: DC | PRN
  Administered 2017-11-17 (×2): 50 ug via INTRAVENOUS

## 2017-11-17 MED ORDER — SODIUM CHLORIDE 0.9 % IV SOLN
INTRAVENOUS | Status: DC
  Administered 2017-11-17: 1000 mL via INTRAVENOUS

## 2017-11-17 MED ORDER — FENTANYL CITRATE (PF) 100 MCG/2ML IJ SOLN
INTRAMUSCULAR | Status: AC
  Filled 2017-11-17: qty 2

## 2017-11-17 MED ORDER — PROPOFOL 500 MG/50ML IV EMUL
INTRAVENOUS | Status: AC
  Filled 2017-11-17: qty 50

## 2017-11-17 MED ORDER — LIDOCAINE HCL (CARDIAC) 20 MG/ML IV SOLN
INTRAVENOUS | Status: DC | PRN
  Administered 2017-11-17: 100 mg via INTRAVENOUS

## 2017-11-17 MED ORDER — PROPOFOL 500 MG/50ML IV EMUL
INTRAVENOUS | Status: DC | PRN
  Administered 2017-11-17: 125 ug/kg/min via INTRAVENOUS

## 2017-11-17 NOTE — Anesthesia Post-op Follow-up Note (Signed)
Anesthesia QCDR form completed.        

## 2017-11-17 NOTE — Anesthesia Preprocedure Evaluation (Signed)
Anesthesia Evaluation  Patient identified by MRN, date of birth, ID band Patient awake    Reviewed: Allergy & Precautions, H&P , NPO status , Patient's Chart, lab work & pertinent test results, reviewed documented beta blocker date and time   History of Anesthesia Complications Negative for: history of anesthetic complications  Airway Mallampati: II  TM Distance: >3 FB Neck ROM: full    Dental  (+) Caps, Dental Advidsory Given, Teeth Intact   Pulmonary neg pulmonary ROS, former smoker,           Cardiovascular Exercise Tolerance: Good hypertension, (-) angina(-) CAD, (-) Past MI, (-) Cardiac Stents and (-) CABG (-) dysrhythmias + Valvular Problems/Murmurs      Neuro/Psych neg Seizures  Neuromuscular disease negative psych ROS   GI/Hepatic Neg liver ROS, GERD  ,  Endo/Other  diabetes  Renal/GU Renal disease  negative genitourinary   Musculoskeletal   Abdominal   Peds  Hematology negative hematology ROS (+)   Anesthesia Other Findings Past Medical History: No date: BPH (benign prostatic hyperplasia) No date: Diabetes mellitus without complication ([facility]) No date: Incontinence No date: Nocturia No date: OAB (overactive bladder) No date: Prostate cancer ([facility])  BMI    Body Mass Index:  26.75 kg/m    Past Surgical History: No date: CHOLECYSTECTOMY No date: HERNIA REPAIR     Comment:  x 3 No date: PROSTATECTOMY   Reproductive/Obstetrics negative OB ROS                             Anesthesia Physical  Anesthesia Plan  ASA: III  Anesthesia Plan: General   Post-op Pain Management:    Induction: Intravenous  PONV Risk Score and Plan: 2 and Propofol infusion  Airway Management Planned: Nasal Cannula  Additional Equipment:   Intra-op Plan:   Post-operative Plan:   Informed Consent: I have reviewed the patients History and Physical, chart, labs and discussed the procedure  including the risks, benefits and alternatives for the proposed anesthesia with the patient or authorized representative who has indicated his/her understanding and acceptance.   Dental Advisory Given  Plan Discussed with: Anesthesiologist, CRNA and Surgeon  Anesthesia Plan Comments:         Anesthesia Quick Evaluation

## 2017-11-17 NOTE — Transfer of Care (Signed)
Immediate Anesthesia Transfer of Care Note  Patient: Gerald Alvarez.  Procedure(s) Performed: ENDOSCOPIC RETROGRADE CHOLANGIOPANCREATOGRAPHY (ERCP) (N/A )  Patient Location: PACU  Anesthesia Type:General  Level of Consciousness: drowsy and patient cooperative  Airway & Oxygen Therapy: Patient Spontanous Breathing and Patient connected to nasal cannula oxygen  Post-op Assessment: Report given to RN, Post -op Vital signs reviewed and stable and Patient moving all extremities X 4  Post vital signs: Reviewed and stable  Last Vitals:  Vitals:   11/17/17 1309 11/17/17 1315  BP:  120/81  Pulse:  95  Resp:  (!) 21  Temp: 36.8 C 36.8 C  SpO2:  100%    Last Pain:  Vitals:   11/17/17 1315  TempSrc: Tympanic  PainSc: 0-No pain         Complications: No apparent anesthesia complications

## 2017-11-17 NOTE — H&P (Signed)
Lucilla Lame, MD [address] [place], [place] [address] Phone:[phone] Fax : [phone]  Primary Care Physician:  Rusty Aus, MD Primary Gastroenterologist:  Dr. Allen Norris  Pre-Procedure History & Physical: HPI:  Gerald Alvarez. is a 84 y.o. male is here for an ERCP.   Past Medical History:  Diagnosis Date  . BPH (benign prostatic hyperplasia)   . Diabetes mellitus without complication ([place])   . Incontinence   . Nocturia   . OAB (overactive bladder)   . Pancreatic mass   . Prostate cancer [facility])     Past Surgical History:  Procedure Laterality Date  . CATARACT EXTRACTION, BILATERAL Bilateral   . CHOLECYSTECTOMY    . ENDOSCOPIC RETROGRADE CHOLANGIOPANCREATOGRAPHY (ERCP) WITH PROPOFOL N/A 09/27/2017   Procedure: ENDOSCOPIC RETROGRADE CHOLANGIOPANCREATOGRAPHY (ERCP) WITH PROPOFOL;  Surgeon: Lucilla Lame, MD;  Location: [facility] ENDOSCOPY;  Service: Endoscopy;  Laterality: N/A;  . HERNIA REPAIR     x 3  . PORTA CATH INSERTION N/A 10/22/2017   Procedure: PORTA CATH INSERTION;  Surgeon: Algernon Huxley, MD;  Location: [place] CV LAB;  Service: Cardiovascular;  Laterality: N/A;  . PROSTATECTOMY      Prior to Admission medications   Medication Sig Start Date End Date Taking? Authorizing Provider  LEVEMIR FLEXTOUCH 100 UNIT/ML Pen Inject 15 Units into the skin daily at 10 pm. Patient taking differently: Inject 5 Units into the skin daily at 10 pm.  09/28/17  Yes Wieting, Richard, MD  Cholecalciferol (VITAMIN D3) 2000 units capsule Take 2,000 Units by mouth daily.  03/05/17   [provider]  doxazosin (CARDURA) 2 MG tablet Take 2 mg by mouth daily.  12/31/16 12/31/17  [provider]  DULoxetine (CYMBALTA) 20 MG capsule Take 1 capsule by mouth daily. 09/12/17   [provider]  hydrochlorothiazide (MICROZIDE) 12.5 MG capsule Take 12.5 mg by mouth as needed.    [provider]  hydrOXYzine (VISTARIL) 25 MG capsule Take 12.5 mg by  mouth 3 (three) times daily as needed for itching.    [provider]  lactulose (CHRONULAC) 10 GM/15ML solution Take by mouth 2 (two) times daily.     [provider]  lidocaine-prilocaine (EMLA) cream Apply to affected area once 10/16/17   Lloyd Huger, MD  megestrol (MEGACE) 40 MG tablet Take 40 mg by mouth daily. 10/30/17   [provider]  Multiple Vitamin (MULTI-VITAMINS) TABS Take 1 tablet by mouth daily.     [provider]  ondansetron (ZOFRAN) 4 MG tablet Take 1 tablet (4 mg total) by mouth every 8 (eight) hours as needed for nausea or vomiting. 10/27/17   Lloyd Huger, MD  pantoprazole (PROTONIX) 40 MG tablet TAKE 1 TABLET TWICE A DAY 02/08/15   [provider]  prochlorperazine (COMPAZINE) 10 MG tablet Take 1 tablet (10 mg total) by mouth every 6 (six) hours as needed (Nausea or vomiting). 10/16/17   Lloyd Huger, MD  protein supplement shake (PREMIER PROTEIN) LIQD Take 325 mLs (11 oz total) by mouth 2 (two) times daily between meals. 09/28/17   Loletha Grayer, MD  sertraline (ZOLOFT) 50 MG tablet Take 1 tablet by mouth daily. 07/14/17   [provider]  traZODone (DESYREL) 50 MG tablet Take 50 mg by mouth at bedtime.  12/29/16   [provider]  vitamin B-12 (CYANOCOBALAMIN) 1000 MCG tablet Take 1,000 mcg by mouth.     [provider]    Allergies as of 10/14/2017 -  Review Complete 10/14/2017  Allergen Reaction Noted  . Sulfa antibiotics Nausea And Vomiting 03/15/2015    Family History  Problem Relation Age of Onset  . Prostate cancer Brother   . Bladder Cancer Brother   . Pancreatic cancer Brother   . Prostate cancer Brother   . Lymphoma Mother   . Leukemia Father   . Breast cancer Sister   . Uterine cancer Sister   . Kidney cancer Neg Hx     Social History   Socioeconomic History  . Marital status: Married    Spouse name: Not on file  . Number of children: Not on file  . Years of  education: Not on file  . Highest education level: Not on file  Social Needs  . Financial resource strain: Not on file  . Food insecurity - worry: Not on file  . Food insecurity - inability: Not on file  . Transportation needs - medical: Not on file  . Transportation needs - non-medical: Not on file  Occupational History  . Not on file  Tobacco Use  . Smoking status: Former Smoker    Last attempt to quit: 03/15/1967    Years since quitting: 50.7  . Smokeless tobacco: Former Systems developer    Types: Chew  . Tobacco comment: quit long time  Substance and Sexual Activity  . Alcohol use: No    Alcohol/week: 0.0 oz  . Drug use: No  . Sexual activity: No  Other Topics Concern  . Not on file  Social History Narrative  . Not on file    Review of Systems: See HPI, otherwise negative ROS  Physical Exam: BP (!) 105/59   Pulse 100   Temp (!) 97.3 F (36.3 C) (Tympanic)   Resp 16   Ht 5\' 4"  (1.626 m)   Wt 146 lb (66.2 kg)   SpO2 100%   BMI 25.06 kg/m  General:   Alert,  pleasant and cooperative in NAD Head:  Normocephalic and atraumatic. Neck:  Supple; no masses or thyromegaly. Lungs:  Clear throughout to auscultation.    Heart:  Regular rate and rhythm. Abdomen:  Soft, nontender and nondistended. Normal bowel sounds, without guarding, and without rebound.   Neurologic:  Alert and  oriented x4;  grossly normal neurologically.  Impression/Plan: Gerald Alvarez. is here for an ERCP to be performed for stent change  Risks, benefits, limitations, and alternatives regarding  ERCP have been reviewed with the patient.  Questions have been answered.  All parties agreeable.   Lucilla Lame, MD  11/17/2017, 12:37 PM

## 2017-11-17 NOTE — Anesthesia Postprocedure Evaluation (Signed)
Anesthesia Post Note  Patient: Conway Fedora.  Procedure(s) Performed: ENDOSCOPIC RETROGRADE CHOLANGIOPANCREATOGRAPHY (ERCP) (N/A )  Patient location during evaluation: Endoscopy Anesthesia Type: General Level of consciousness: oriented, awake and alert and patient cooperative Pain management: pain level controlled Vital Signs Assessment: post-procedure vital signs reviewed and stable Respiratory status: spontaneous breathing and respiratory function stable Cardiovascular status: blood pressure returned to baseline and stable Postop Assessment: no headache, no backache, patient able to bend at knees, no apparent nausea or vomiting and adequate PO intake Anesthetic complications: no     Last Vitals:  Vitals:   11/17/17 1309 11/17/17 1315  BP:  120/81  Pulse:  95  Resp:  (!) 21  Temp: 36.8 C 36.8 C  SpO2:  100%    Last Pain:  Vitals:   11/17/17 1315  TempSrc: Tympanic  PainSc: 0-No pain                 Safiyya Stokes H Cataldo Cosgriff

## 2017-11-17 NOTE — Op Note (Signed)
[facility] Gastroenterology Patient Name: Gerald Alvarez Procedure Date: 11/17/2017 12:21 PM MRN: [id number] Account #: [id number] Date of Birth: 02-01-1926 Admit Type: Outpatient Age: 84 Room: [facility] ENDO ROOM 4 Gender: Male Note Status: Finalized Procedure:            ERCP Indications:          Malignant tumor of the head of pancreas, Stent change Providers:            Lucilla Lame MD, MD Referring MD:         Rusty Aus, MD (Referring MD) Medicines:            Propofol per Anesthesia Complications:        No immediate complications. Procedure:            Pre-Anesthesia Assessment:                       - Prior to the procedure, a History and Physical was                        performed, and patient medications and allergies were                        reviewed. The patient's tolerance of previous                        anesthesia was also reviewed. The risks and benefits of                        the procedure and the sedation options and risks were                        discussed with the patient. All questions were                        answered, and informed consent was obtained. Prior                        Anticoagulants: The patient has taken no previous                        anticoagulant or antiplatelet agents. ASA Grade                        Assessment: II - A patient with mild systemic disease.                        After reviewing the risks and benefits, the patient was                        deemed in satisfactory condition to undergo the                        procedure.                       After obtaining informed consent, the scope was passed                        under direct vision. Throughout the procedure, the  patient's blood pressure, pulse, and oxygen saturations                        were monitored continuously. The ERCP was introduced                        through the mouth, and used to inject contrast  into and                        used to cannulate the bile duct. The ERCP was                        accomplished without difficulty. The patient tolerated                        the procedure well. Findings:      A biliary stent was visible on the scout film. The esophagus was       successfully intubated under direct vision. The scope was advanced to a       normal major papilla in the descending duodenum without detailed       examination of the pharynx, larynx and associated structures, and upper       GI tract. The upper GI tract was grossly normal. One plastic stent       originating in the biliary tree was emerging from the major papilla. One       stent was removed from the biliary tree using a snare. The bile duct was       deeply cannulated with the short-nosed traction sphincterotome. Contrast       was injected. I personally interpreted the bile duct images. There was       brisk flow of contrast through the ducts. Image quality was excellent.       Contrast extended to the entire biliary tree. The lower third of the       main bile duct contained a single severe stenosis. A wire was passed       into the biliary tree. To discover objects, the biliary tree was swept       with a 15 mm balloon starting at the bifurcation. Nothing was found. One       10 Fr by 6 cm covered metal stent was placed 5 cm into the common bile       duct. Bile flowed through the stent. The stent was in good position. Impression:           - One stent from the biliary tree was seen in the major                        papilla.                       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 The stricture                        was malignant appearing.                       - One stent was removed from the biliary tree.                       - The  biliary tree was swept and nothing was found.                       - One covered metal stent was placed into the common                        bile  duct. Recommendation:       - Discharge patient to home.                       - Continue present medications. Procedure Code(s):    --- Professional ---                       [phone], Endoscopic retrograde cholangiopancreatography                        (ERCP); with removal and exchange of stent(s), biliary                        or pancreatic duct, including pre- and post-dilation                        and guide wire passage, when performed, including                        sphincterotomy, when performed, each stent exchanged                       [phone], Endoscopic catheterization of the biliary ductal                        system, radiological supervision and interpretation Diagnosis Code(s):    --- Professional ---                       C25.0, Malignant neoplasm of head of pancreas                       K83.1, Obstruction of bile duct                       Z46.59, Encounter for fitting and adjustment of other                        gastrointestinal appliance and device CPT copyright 2016 American Medical Association. All rights reserved. The codes documented in this report are preliminary and upon coder review may  be revised to meet current compliance requirements. Lucilla Lame MD, MD 11/17/2017 1:11:02 PM This report has been signed electronically. Number of Addenda: 0 Note Initiated On: 11/17/2017 12:21 PM      [facility]

## 2017-11-18 ENCOUNTER — Encounter: Payer: Self-pay | Admitting: Gastroenterology

## 2017-11-22 NOTE — Progress Notes (Signed)
[place]  Telephone:([phone] [phone]  ID: Gerald Alvarez. OB: 09/28/1925  MR#: [id number]  JJO#:[id number]  Patient Care Team: Rusty Aus, MD as PCP - General (Internal Medicine) Clent Jacks, RN as Registered Nurse   CHIEF COMPLAINT: Stage IIB pancreatic adenocarcinoma.  INTERVAL HISTORY: Patient returns to clinic today or further evaluation and consideration of cycle 2, day 1 of  Abraxane.  His rash has resolved since discontinuing gemcitabine.  Cellulitis on his foot has significantly improved, although he still has residual edema.  He has no neurologic complaints.  He denies any recent fevers or illnesses.  He has no chest pain or shortness of breath.  He denies any nausea, vomiting or diarrhea. He has occasional constipation.  He has no melena or hematochezia.  He has no urinary complaints.  Patient offers no further specific complaints today.  REVIEW OF SYSTEMS:   Review of Systems  Constitutional: Positive for malaise/fatigue. Negative for fever and weight loss.  Respiratory: Negative.  Negative for cough and shortness of breath.   Cardiovascular: Negative.  Negative for chest pain and leg swelling.  Gastrointestinal: Positive for constipation. Negative for abdominal pain, blood in stool, diarrhea, melena and nausea.  Genitourinary: Negative.  Negative for dysuria.  Musculoskeletal: Negative.   Skin: Negative.  Negative for itching and rash.  Neurological: Positive for weakness.  Psychiatric/Behavioral: Negative.  The patient is not nervous/anxious.     As per HPI. Otherwise, a complete review of systems is negative.  PAST MEDICAL HISTORY: Past Medical History:  Diagnosis Date  . BPH (benign prostatic hyperplasia)   . Diabetes mellitus without complication ([place])   . Incontinence   . Nocturia   . OAB (overactive bladder)   . Pancreatic mass   . Prostate cancer ([place])     PAST SURGICAL HISTORY: Past Surgical History:    Procedure Laterality Date  . CATARACT EXTRACTION, BILATERAL Bilateral   . CHOLECYSTECTOMY    . ENDOSCOPIC RETROGRADE CHOLANGIOPANCREATOGRAPHY (ERCP) WITH PROPOFOL N/A 09/27/2017   Procedure: ENDOSCOPIC RETROGRADE CHOLANGIOPANCREATOGRAPHY (ERCP) WITH PROPOFOL;  Surgeon: Lucilla Lame, MD;  Location: [facility] ENDOSCOPY;  Service: Endoscopy;  Laterality: N/A;  . ERCP N/A 11/17/2017   Procedure: ENDOSCOPIC RETROGRADE CHOLANGIOPANCREATOGRAPHY (ERCP);  Surgeon: Lucilla Lame, MD;  Location: [facility] ENDOSCOPY;  Service: Endoscopy;  Laterality: N/A;  . HERNIA REPAIR     x 3  . PORTA CATH INSERTION N/A 10/22/2017   Procedure: PORTA CATH INSERTION;  Surgeon: Algernon Huxley, MD;  Location: [place] CV LAB;  Service: Cardiovascular;  Laterality: N/A;  . PROSTATECTOMY      FAMILY HISTORY: Family History  Problem Relation Age of Onset  . Prostate cancer Brother   . Bladder Cancer Brother   . Pancreatic cancer Brother   . Prostate cancer Brother   . Lymphoma Mother   . Leukemia Father   . Breast cancer Sister   . Uterine cancer Sister   . Kidney cancer Neg Hx     ADVANCED DIRECTIVES (Y/N):  N  HEALTH MAINTENANCE: Social History   Tobacco Use  . Smoking status: Former Smoker    Last attempt to quit: 03/15/1967    Years since quitting: 50.7  . Smokeless tobacco: Former Systems developer    Types: Chew  . Tobacco comment: quit long time  Substance Use Topics  . Alcohol use: No    Alcohol/week: 0.0 oz  . Drug use: No     Colonoscopy:  PAP:  Bone density:  Lipid panel:  Allergies  Allergen Reactions  . Sulfa Antibiotics Nausea And Vomiting    Current Outpatient Medications  Medication Sig Dispense Refill  . hydrochlorothiazide (MICROZIDE) 12.5 MG capsule Take 12.5 mg by mouth as needed.    Marland Kitchen LEVEMIR FLEXTOUCH 100 UNIT/ML Pen Inject 15 Units into the skin daily at 10 pm. (Patient taking differently: Inject 5 Units into the skin daily at 10 pm. ) 15 mL   . lidocaine-prilocaine (EMLA) cream Apply to  affected area once 30 g 3  . megestrol (MEGACE) 40 MG tablet Take 40 mg by mouth daily.  0  . Multiple Vitamin (MULTI-VITAMINS) TABS Take 1 tablet by mouth daily.     . pantoprazole (PROTONIX) 40 MG tablet TAKE 1 TABLET TWICE A DAY    . protein supplement shake (PREMIER PROTEIN) LIQD Take 325 mLs (11 oz total) by mouth 2 (two) times daily between meals. 30 Can 0  . sertraline (ZOLOFT) 50 MG tablet Take 1 tablet by mouth daily.    . traZODone (DESYREL) 50 MG tablet Take 50 mg by mouth at bedtime.     . vitamin B-12 (CYANOCOBALAMIN) 1000 MCG tablet Take 1,000 mcg by mouth.     . Cholecalciferol (VITAMIN D3) 2000 units capsule Take 2,000 Units by mouth daily.     Marland Kitchen doxazosin (CARDURA) 2 MG tablet Take 2 mg by mouth daily.     . DULoxetine (CYMBALTA) 20 MG capsule Take 1 capsule by mouth daily.    . hydrOXYzine (VISTARIL) 25 MG capsule Take 12.5 mg by mouth 3 (three) times daily as needed for itching.    . lactulose (CHRONULAC) 10 GM/15ML solution Take by mouth 2 (two) times daily.     . ondansetron (ZOFRAN) 4 MG tablet Take 1 tablet (4 mg total) by mouth every 8 (eight) hours as needed for nausea or vomiting. (Patient not taking: Reported on 11/24/2017) 30 tablet 2  . prochlorperazine (COMPAZINE) 10 MG tablet Take 1 tablet (10 mg total) by mouth every 6 (six) hours as needed (Nausea or vomiting). (Patient not taking: Reported on 11/24/2017) 60 tablet 2   No current facility-administered medications for this visit.    Facility-Administered Medications Ordered in Other Visits  Medication Dose Route Frequency Provider Last Rate Last Dose  . PACLitaxel-protein bound (ABRAXANE) chemo infusion 225 mg  125 mg/m2 Intravenous Once Lloyd Huger, MD        OBJECTIVE: Vitals:   11/24/17 0945  BP: 123/74  Pulse: 83  Resp: 18  Temp: 98.3 F (36.8 C)     There is no height or weight on file to calculate BMI.    ECOG FS:0 - Asymptomatic  General: Well-developed, well-nourished, no acute  distress. Eyes: Pink conjunctiva, anicteric sclera. Lungs: Clear to auscultation bilaterally. Heart: Regular rate and rhythm. No rubs, murmurs, or gallops. Abdomen: Soft, nontender, nondistended. No organomegaly noted, normoactive bowel sounds. Musculoskeletal: 1-2+ edema bilateral lower extremity. Neuro: Alert, answering all questions appropriately. Cranial nerves grossly intact. Skin: Mild maculopapular rash mainly on torso.  Erythema on left lower extremity improved. Psych: Normal affect.   LAB RESULTS:  Lab Results  Component Value Date   NA 136 11/24/2017   K 3.2 (L) 11/24/2017   CL 107 11/24/2017   CO2 24 11/24/2017   GLUCOSE 111 (H) 11/24/2017   BUN 12 11/24/2017   CREATININE 0.69 11/24/2017   CALCIUM 8.4 (L) 11/24/2017   PROT 5.9 (L) 11/24/2017   ALBUMIN 2.7 (L) 11/24/2017   AST 18 11/24/2017   ALT 13 (L) 11/24/2017  ALKPHOS 75 11/24/2017   BILITOT 0.6 11/24/2017   GFRNONAA >60 11/24/2017   GFRAA >60 11/24/2017    Lab Results  Component Value Date   WBC 8.0 11/24/2017   NEUTROABS 4.8 11/24/2017   HGB 11.5 (L) 11/24/2017   HCT 33.1 (L) 11/24/2017   MCV 102.7 (H) 11/24/2017   PLT 319 11/24/2017     STUDIES: Dg C-arm 1-60 Min-no Report  Result Date: 11/17/2017 Fluoroscopy was utilized by the requesting physician.  No radiographic interpretation.    ASSESSMENT: Stage IIB pancreatic adenocarcinoma  PLAN:    1.  Stage IIB pancreatic adenocarcinoma: MRI and MRCP results reviewed independently.  Patient recently had EUS at [facility] that confirmed adenocarcinoma.  His Ca 19-9 is now trending down and is 150, today's result is pending.  Patient is not a surgical candidate, but has agreed to pursue palliative chemotherapy using gemcitabine and Abraxane.  PET scan results from October 20, 2017 reviewed independently confirming stage of disease.  Will discontinue gemcitabine secondary to rash and proceed with cycle 2, day 1 of Abraxane only.  Return to clinic  in 1 week for further evaluation and consideration of cycle 2, day 8.  2.  Elevated liver enzymes: Resolved.  Patient had a permanent stent replacement on November 17, 2017. 3.  Nausea: Continue Zofran and compazine as prescribed. 4.  Constipation: Patient does not complain of this today. 5.  Elevated bilirubin: Resolved.  Stent replacement as above.  Monitor. 6.  Rash: Resolved.  Gemcitabine has been discontinued.  7.  Poor appetite: Improved.  Continue Megace.  Appreciate dietary input.    Patient expressed understanding and was in agreement with this plan. He also understands that He can call clinic at any time with any questions, concerns, or complaints.   Cancer Staging Pancreatic adenocarcinoma [facility]) Staging form: Exocrine Pancreas, AJCC 8th Edition - Clinical stage from 10/16/2017: Stage IIB (cT3, cN1, cM0) - Signed by Lloyd Huger, MD on 10/25/2017   Lloyd Huger, MD   11/24/2017 11:20 AM

## 2017-11-24 ENCOUNTER — Encounter: Payer: Self-pay | Admitting: Oncology

## 2017-11-24 ENCOUNTER — Inpatient Hospital Stay: Payer: Medicare Other

## 2017-11-24 ENCOUNTER — Inpatient Hospital Stay (HOSPITAL_BASED_OUTPATIENT_CLINIC_OR_DEPARTMENT_OTHER): Payer: Medicare Other | Admitting: Oncology

## 2017-11-24 VITALS — BP 123/74 | HR 83 | Temp 98.3°F | Resp 18

## 2017-11-24 DIAGNOSIS — N3944 Nocturnal enuresis: Secondary | ICD-10-CM

## 2017-11-24 DIAGNOSIS — C259 Malignant neoplasm of pancreas, unspecified: Secondary | ICD-10-CM

## 2017-11-24 DIAGNOSIS — E119 Type 2 diabetes mellitus without complications: Secondary | ICD-10-CM

## 2017-11-24 DIAGNOSIS — I2510 Atherosclerotic heart disease of native coronary artery without angina pectoris: Secondary | ICD-10-CM | POA: Diagnosis not present

## 2017-11-24 DIAGNOSIS — M8580 Other specified disorders of bone density and structure, unspecified site: Secondary | ICD-10-CM

## 2017-11-24 DIAGNOSIS — Z87891 Personal history of nicotine dependence: Secondary | ICD-10-CM | POA: Diagnosis not present

## 2017-11-24 DIAGNOSIS — Z79899 Other long term (current) drug therapy: Secondary | ICD-10-CM | POA: Diagnosis not present

## 2017-11-24 DIAGNOSIS — R110 Nausea: Secondary | ICD-10-CM | POA: Diagnosis not present

## 2017-11-24 DIAGNOSIS — Z5111 Encounter for antineoplastic chemotherapy: Secondary | ICD-10-CM

## 2017-11-24 DIAGNOSIS — Z8546 Personal history of malignant neoplasm of prostate: Secondary | ICD-10-CM

## 2017-11-24 DIAGNOSIS — Z806 Family history of leukemia: Secondary | ICD-10-CM

## 2017-11-24 DIAGNOSIS — I700 Atherosclerosis of aorta: Secondary | ICD-10-CM | POA: Diagnosis not present

## 2017-11-24 DIAGNOSIS — Z8052 Family history of malignant neoplasm of bladder: Secondary | ICD-10-CM

## 2017-11-24 DIAGNOSIS — N400 Enlarged prostate without lower urinary tract symptoms: Secondary | ICD-10-CM | POA: Diagnosis not present

## 2017-11-24 DIAGNOSIS — I517 Cardiomegaly: Secondary | ICD-10-CM | POA: Diagnosis not present

## 2017-11-24 DIAGNOSIS — Z8042 Family history of malignant neoplasm of prostate: Secondary | ICD-10-CM

## 2017-11-24 DIAGNOSIS — Z803 Family history of malignant neoplasm of breast: Secondary | ICD-10-CM

## 2017-11-24 LAB — CBC WITH DIFFERENTIAL/PLATELET
Basophils Absolute: 0.1 10*3/uL (ref 0–0.1)
Basophils Relative: 1 %
EOS ABS: 0.1 10*3/uL (ref 0–0.7)
EOS PCT: 1 %
HCT: 33.1 % — ABNORMAL LOW (ref 40.0–52.0)
Hemoglobin: 11.5 g/dL — ABNORMAL LOW (ref 13.0–18.0)
Lymphocytes Relative: 23 %
Lymphs Abs: 1.8 10*3/uL (ref 1.0–3.6)
MCH: 35.7 pg — AB (ref 26.0–34.0)
MCHC: 34.7 g/dL (ref 32.0–36.0)
MCV: 102.7 fL — ABNORMAL HIGH (ref 80.0–100.0)
MONO ABS: 1.2 10*3/uL — AB (ref 0.2–1.0)
MONOS PCT: 15 %
Neutro Abs: 4.8 10*3/uL (ref 1.4–6.5)
Neutrophils Relative %: 60 %
PLATELETS: 319 10*3/uL (ref 150–440)
RBC: 3.23 MIL/uL — ABNORMAL LOW (ref 4.40–5.90)
RDW: 13.9 % (ref 11.5–14.5)
WBC: 8 10*3/uL (ref 3.8–10.6)

## 2017-11-24 LAB — COMPREHENSIVE METABOLIC PANEL
ALK PHOS: 75 U/L (ref 38–126)
ALT: 13 U/L — AB (ref 17–63)
AST: 18 U/L (ref 15–41)
Albumin: 2.7 g/dL — ABNORMAL LOW (ref 3.5–5.0)
Anion gap: 5 (ref 5–15)
BUN: 12 mg/dL (ref 6–20)
CALCIUM: 8.4 mg/dL — AB (ref 8.9–10.3)
CO2: 24 mmol/L (ref 22–32)
CREATININE: 0.69 mg/dL (ref 0.61–1.24)
Chloride: 107 mmol/L (ref 101–111)
GFR calc Af Amer: >60 mL/min (ref >60)
Glucose, Bld: 111 mg/dL — ABNORMAL HIGH (ref 65–99)
Potassium: 3.2 mmol/L — ABNORMAL LOW (ref 3.5–5.1)
Sodium: 136 mmol/L (ref 135–145)
Total Bilirubin: 0.6 mg/dL (ref 0.3–1.2)
Total Protein: 5.9 g/dL — ABNORMAL LOW (ref 6.5–8.1)

## 2017-11-24 MED ORDER — HEPARIN SOD (PORK) LOCK FLUSH 100 UNIT/ML IV SOLN
500.0000 [IU] | Freq: Once | INTRAVENOUS | Status: AC
  Administered 2017-11-24: 500 [IU] via INTRAVENOUS

## 2017-11-24 MED ORDER — HEPARIN SOD (PORK) LOCK FLUSH 100 UNIT/ML IV SOLN
INTRAVENOUS | Status: AC
  Filled 2017-11-24: qty 5

## 2017-11-24 MED ORDER — PACLITAXEL PROTEIN-BOUND CHEMO INJECTION 100 MG
125.0000 mg/m2 | Freq: Once | INTRAVENOUS | Status: AC
  Administered 2017-11-24: 225 mg via INTRAVENOUS
  Filled 2017-11-24: qty 45

## 2017-11-24 MED ORDER — SODIUM CHLORIDE 0.9 % IV SOLN
Freq: Once | INTRAVENOUS | Status: AC
  Administered 2017-11-24: 11:00:00 via INTRAVENOUS
  Filled 2017-11-24: qty 1000

## 2017-11-24 MED ORDER — PACLITAXEL PROTEIN-BOUND CHEMO INJECTION 100 MG
125.0000 mg/m2 | Freq: Once | Status: DC
  Filled 2017-11-24: qty 45

## 2017-11-24 MED ORDER — PROCHLORPERAZINE MALEATE 10 MG PO TABS
10.0000 mg | ORAL_TABLET | Freq: Once | ORAL | Status: AC
  Administered 2017-11-24: 10 mg via ORAL
  Filled 2017-11-24: qty 1

## 2017-11-24 NOTE — Progress Notes (Signed)
Pt in for follow up. Reports continues to be very fatigued.

## 2017-11-25 LAB — CANCER ANTIGEN 19-9: CA 19-9: 274 U/mL — ABNORMAL HIGH (ref 0–35)

## 2017-11-29 NOTE — Progress Notes (Signed)
[place]  Telephone:([phone] [phone]  ID: Gerald Alvarez. OB: 1926/02/06  MR#: [id number]  ZSW#:[id number]  Patient Care Team: Rusty Aus, MD as PCP - General (Internal Medicine) Clent Jacks, RN as Registered Nurse   CHIEF COMPLAINT: Stage IIB pancreatic adenocarcinoma.  INTERVAL HISTORY: Patient returns to clinic today or further evaluation and consideration of cycle 2, day 8 of  Abraxane.  His rash has resolved since discontinuing gemcitabine.  Cellulitis on his foot has resolved as well.  He is having difficulty sleeping and therefore is more "worn out" during the day.  He also has increased anxiety. He has no neurologic complaints.  He denies any recent fevers or illnesses.  He has no chest pain or shortness of breath.  He denies any nausea, vomiting or diarrhea. He has occasional constipation.  He has no melena or hematochezia.  He has no urinary complaints.  Patient offers no further specific complaints today.  REVIEW OF SYSTEMS:   Review of Systems  Constitutional: Positive for malaise/fatigue. Negative for fever and weight loss.  Respiratory: Negative.  Negative for cough and shortness of breath.   Cardiovascular: Negative.  Negative for chest pain and leg swelling.  Gastrointestinal: Positive for constipation. Negative for abdominal pain, blood in stool, diarrhea, melena and nausea.  Genitourinary: Negative.  Negative for dysuria.  Musculoskeletal: Negative.   Skin: Negative.  Negative for itching and rash.  Neurological: Positive for weakness.  Psychiatric/Behavioral: The patient is nervous/anxious and has insomnia.     As per HPI. Otherwise, a complete review of systems is negative.  PAST MEDICAL HISTORY: Past Medical History:  Diagnosis Date  . BPH (benign prostatic hyperplasia)   . Diabetes mellitus without complication ([place])   . Incontinence   . Nocturia   . OAB (overactive bladder)   . Pancreatic mass   . Prostate  cancer ([place])     PAST SURGICAL HISTORY: Past Surgical History:  Procedure Laterality Date  . CATARACT EXTRACTION, BILATERAL Bilateral   . CHOLECYSTECTOMY    . ENDOSCOPIC RETROGRADE CHOLANGIOPANCREATOGRAPHY (ERCP) WITH PROPOFOL N/A 09/27/2017   Procedure: ENDOSCOPIC RETROGRADE CHOLANGIOPANCREATOGRAPHY (ERCP) WITH PROPOFOL;  Surgeon: Lucilla Lame, MD;  Location: [facility] ENDOSCOPY;  Service: Endoscopy;  Laterality: N/A;  . ERCP N/A 11/17/2017   Procedure: ENDOSCOPIC RETROGRADE CHOLANGIOPANCREATOGRAPHY (ERCP);  Surgeon: Lucilla Lame, MD;  Location: [facility] ENDOSCOPY;  Service: Endoscopy;  Laterality: N/A;  . HERNIA REPAIR     x 3  . PORTA CATH INSERTION N/A 10/22/2017   Procedure: PORTA CATH INSERTION;  Surgeon: Algernon Huxley, MD;  Location: [place] CV LAB;  Service: Cardiovascular;  Laterality: N/A;  . PROSTATECTOMY      FAMILY HISTORY: Family History  Problem Relation Age of Onset  . Prostate cancer Brother   . Bladder Cancer Brother   . Pancreatic cancer Brother   . Prostate cancer Brother   . Lymphoma Mother   . Leukemia Father   . Breast cancer Sister   . Uterine cancer Sister   . Kidney cancer Neg Hx     ADVANCED DIRECTIVES (Y/N):  N  HEALTH MAINTENANCE: Social History   Tobacco Use  . Smoking status: Former Smoker    Last attempt to quit: 03/15/1967    Years since quitting: 50.7  . Smokeless tobacco: Former Systems developer    Types: Chew  . Tobacco comment: quit long time  Substance Use Topics  . Alcohol use: No    Alcohol/week: 0.0 oz  . Drug use: No  Colonoscopy:  PAP:  Bone density:  Lipid panel:  Allergies  Allergen Reactions  . Sulfa Antibiotics Nausea And Vomiting    Current Outpatient Medications  Medication Sig Dispense Refill  . Cholecalciferol (VITAMIN D3) 2000 units capsule Take 2,000 Units by mouth daily.     Marland Kitchen doxazosin (CARDURA) 2 MG tablet Take 2 mg by mouth daily.     . DULoxetine (CYMBALTA) 20 MG capsule Take 1 capsule by mouth daily.    .  hydrochlorothiazide (MICROZIDE) 12.5 MG capsule Take 12.5 mg by mouth as needed.    . hydrOXYzine (VISTARIL) 25 MG capsule Take 12.5 mg by mouth 3 (three) times daily as needed for itching.    . lactulose (CHRONULAC) 10 GM/15ML solution Take by mouth 2 (two) times daily.     Marland Kitchen LEVEMIR FLEXTOUCH 100 UNIT/ML Pen Inject 15 Units into the skin daily at 10 pm. (Patient taking differently: Inject 5 Units into the skin daily at 10 pm. ) 15 mL   . lidocaine-prilocaine (EMLA) cream Apply to affected area once 30 g 3  . megestrol (MEGACE) 40 MG tablet Take 40 mg by mouth daily.  0  . Multiple Vitamin (MULTI-VITAMINS) TABS Take 1 tablet by mouth daily.     . pantoprazole (PROTONIX) 40 MG tablet TAKE 1 TABLET TWICE A DAY    . protein supplement shake (PREMIER PROTEIN) LIQD Take 325 mLs (11 oz total) by mouth 2 (two) times daily between meals. 30 Can 0  . sertraline (ZOLOFT) 50 MG tablet Take 1 tablet by mouth daily.    . traZODone (DESYREL) 50 MG tablet Take 50 mg by mouth at bedtime.     . vitamin B-12 (CYANOCOBALAMIN) 1000 MCG tablet Take 1,000 mcg by mouth.     . ALPRAZolam (XANAX) 0.25 MG tablet Take 1 tablet (0.25 mg total) by mouth at bedtime as needed for anxiety. 30 tablet 0  . ondansetron (ZOFRAN) 4 MG tablet Take 1 tablet (4 mg total) by mouth every 8 (eight) hours as needed for nausea or vomiting. (Patient not taking: Reported on 12/01/2017) 30 tablet 2  . prochlorperazine (COMPAZINE) 10 MG tablet Take 1 tablet (10 mg total) by mouth every 6 (six) hours as needed (Nausea or vomiting). (Patient not taking: Reported on 11/24/2017) 60 tablet 2   No current facility-administered medications for this visit.     OBJECTIVE: Vitals:   12/01/17 0933  BP: 132/80  Pulse: 86  Resp: 20  Temp: (!) 96.3 F (35.7 C)     Body mass index is 24.13 kg/m.    ECOG FS:0 - Asymptomatic  General: Well-developed, well-nourished, no acute distress. Eyes: Pink conjunctiva, anicteric sclera. Lungs: Clear to  auscultation bilaterally. Heart: Regular rate and rhythm. No rubs, murmurs, or gallops. Abdomen: Soft, nontender, nondistended. No organomegaly noted, normoactive bowel sounds. Musculoskeletal: 1+ edema left lower extremity. Neuro: Alert, answering all questions appropriately. Cranial nerves grossly intact. Skin: Rash resolved.  Erythema on left lower extremity improved. Psych: Normal affect.   LAB RESULTS:  Lab Results  Component Value Date   NA 135 12/01/2017   K 3.8 12/01/2017   CL 106 12/01/2017   CO2 22 12/01/2017   GLUCOSE 175 (H) 12/01/2017   BUN 20 12/01/2017   CREATININE 0.97 12/01/2017   CALCIUM 8.7 (L) 12/01/2017   PROT 6.4 (L) 12/01/2017   ALBUMIN 3.1 (L) 12/01/2017   AST 17 12/01/2017   ALT 11 (L) 12/01/2017   ALKPHOS 63 12/01/2017   BILITOT 0.5 12/01/2017   GFRNONAA >  60 12/01/2017   GFRAA >60 12/01/2017    Lab Results  Component Value Date   WBC 5.5 12/01/2017   NEUTROABS 3.3 12/01/2017   HGB 12.0 (L) 12/01/2017   HCT 35.1 (L) 12/01/2017   MCV 102.9 (H) 12/01/2017   PLT 261 12/01/2017     STUDIES: Dg C-arm 1-60 Min-no Report  Result Date: 11/17/2017 Fluoroscopy was utilized by the requesting physician.  No radiographic interpretation.    ASSESSMENT: Stage IIB pancreatic adenocarcinoma  PLAN:    1.  Stage IIB pancreatic adenocarcinoma: MRI and MRCP results reviewed independently.  Patient recently had EUS at [facility] that confirmed adenocarcinoma.  His Ca 19-9 trended up slightly, but I suspect this may be as a result of manipulation from his bile duct after his recent stent replacement.  Today's result is pending. Patient is not a surgical candidate.  PET scan results from October 20, 2017 reviewed independently confirming stage of disease.  Will discontinue gemcitabine secondary to rash and proceed with cycle 2, day 8 of Abraxane only.  Return to clinic in 1 week for further evaluation by the nurse practitioner and consideration of cycle 2,  day 15.  Patient will then return to clinic in 3 weeks for consideration of cycle 3, day 15. 2.  Elevated liver enzymes: Resolved.  Patient had a permanent stent replacement on November 17, 2017. 3.  Nausea: Continue Zofran and compazine as prescribed. 4.  Constipation: Patient does not complain of this today. 5.  Elevated bilirubin: Resolved.  Stent replacement as above.  Monitor. 6.  Rash: Resolved.  Gemcitabine has been discontinued.  7.  Poor appetite: Improved.  Continue Megace.  Appreciate dietary input. 8.  Anxiety/sleep: Patient has been instructed to discontinue trazodone and was given a prescription for Xanax 0.25 mg at bedtime.   Patient expressed understanding and was in agreement with this plan. He also understands that He can call clinic at any time with any questions, concerns, or complaints.   Cancer Staging Pancreatic adenocarcinoma [facility]) Staging form: Exocrine Pancreas, AJCC 8th Edition - Clinical stage from 10/16/2017: Stage IIB (cT3, cN1, cM0) - Signed by Lloyd Huger, MD on 10/25/2017   Lloyd Huger, MD   12/01/2017 10:03 AM

## 2017-11-30 ENCOUNTER — Other Ambulatory Visit: Payer: Self-pay | Admitting: Oncology

## 2017-12-01 ENCOUNTER — Encounter: Payer: Self-pay | Admitting: Oncology

## 2017-12-01 ENCOUNTER — Inpatient Hospital Stay: Payer: Medicare Other

## 2017-12-01 ENCOUNTER — Inpatient Hospital Stay (HOSPITAL_BASED_OUTPATIENT_CLINIC_OR_DEPARTMENT_OTHER): Payer: Medicare Other | Admitting: Oncology

## 2017-12-01 VITALS — BP 132/80 | HR 86 | Temp 96.3°F | Resp 20 | Wt 140.6 lb

## 2017-12-01 DIAGNOSIS — M8580 Other specified disorders of bone density and structure, unspecified site: Secondary | ICD-10-CM

## 2017-12-01 DIAGNOSIS — N400 Enlarged prostate without lower urinary tract symptoms: Secondary | ICD-10-CM

## 2017-12-01 DIAGNOSIS — N3944 Nocturnal enuresis: Secondary | ICD-10-CM | POA: Diagnosis not present

## 2017-12-01 DIAGNOSIS — Z5111 Encounter for antineoplastic chemotherapy: Secondary | ICD-10-CM | POA: Diagnosis not present

## 2017-12-01 DIAGNOSIS — C259 Malignant neoplasm of pancreas, unspecified: Secondary | ICD-10-CM | POA: Diagnosis not present

## 2017-12-01 DIAGNOSIS — I517 Cardiomegaly: Secondary | ICD-10-CM | POA: Diagnosis not present

## 2017-12-01 DIAGNOSIS — Z806 Family history of leukemia: Secondary | ICD-10-CM

## 2017-12-01 DIAGNOSIS — Z87891 Personal history of nicotine dependence: Secondary | ICD-10-CM | POA: Diagnosis not present

## 2017-12-01 DIAGNOSIS — E119 Type 2 diabetes mellitus without complications: Secondary | ICD-10-CM | POA: Diagnosis not present

## 2017-12-01 DIAGNOSIS — I2510 Atherosclerotic heart disease of native coronary artery without angina pectoris: Secondary | ICD-10-CM | POA: Diagnosis not present

## 2017-12-01 DIAGNOSIS — Z79899 Other long term (current) drug therapy: Secondary | ICD-10-CM | POA: Diagnosis not present

## 2017-12-01 DIAGNOSIS — Z8042 Family history of malignant neoplasm of prostate: Secondary | ICD-10-CM

## 2017-12-01 DIAGNOSIS — F419 Anxiety disorder, unspecified: Secondary | ICD-10-CM | POA: Diagnosis not present

## 2017-12-01 DIAGNOSIS — Z803 Family history of malignant neoplasm of breast: Secondary | ICD-10-CM

## 2017-12-01 DIAGNOSIS — Z8052 Family history of malignant neoplasm of bladder: Secondary | ICD-10-CM

## 2017-12-01 DIAGNOSIS — Z8546 Personal history of malignant neoplasm of prostate: Secondary | ICD-10-CM

## 2017-12-01 DIAGNOSIS — I700 Atherosclerosis of aorta: Secondary | ICD-10-CM | POA: Diagnosis not present

## 2017-12-01 LAB — COMPREHENSIVE METABOLIC PANEL
ALBUMIN: 3.1 g/dL — AB (ref 3.5–5.0)
ALT: 11 U/L — ABNORMAL LOW (ref 17–63)
ANION GAP: 7 (ref 5–15)
AST: 17 U/L (ref 15–41)
Alkaline Phosphatase: 63 U/L (ref 38–126)
BUN: 20 mg/dL (ref 6–20)
CO2: 22 mmol/L (ref 22–32)
Calcium: 8.7 mg/dL — ABNORMAL LOW (ref 8.9–10.3)
Chloride: 106 mmol/L (ref 101–111)
Creatinine, Ser: 0.97 mg/dL (ref 0.61–1.24)
GFR calc Af Amer: >60 mL/min (ref >60)
GFR calc non Af Amer: >60 mL/min (ref >60)
GLUCOSE: 175 mg/dL — AB (ref 65–99)
POTASSIUM: 3.8 mmol/L (ref 3.5–5.1)
SODIUM: 135 mmol/L (ref 135–145)
Total Bilirubin: 0.5 mg/dL (ref 0.3–1.2)
Total Protein: 6.4 g/dL — ABNORMAL LOW (ref 6.5–8.1)

## 2017-12-01 LAB — CBC WITH DIFFERENTIAL/PLATELET
Basophils Absolute: 0.1 10*3/uL (ref 0–0.1)
Basophils Relative: 1 %
EOS PCT: 2 %
Eosinophils Absolute: 0.1 10*3/uL (ref 0–0.7)
HEMATOCRIT: 35.1 % — AB (ref 40.0–52.0)
Hemoglobin: 12 g/dL — ABNORMAL LOW (ref 13.0–18.0)
LYMPHS ABS: 1.6 10*3/uL (ref 1.0–3.6)
LYMPHS PCT: 28 %
MCH: 35.1 pg — AB (ref 26.0–34.0)
MCHC: 34.1 g/dL (ref 32.0–36.0)
MCV: 102.9 fL — ABNORMAL HIGH (ref 80.0–100.0)
MONO ABS: 0.5 10*3/uL (ref 0.2–1.0)
MONOS PCT: 8 %
NEUTROS ABS: 3.3 10*3/uL (ref 1.4–6.5)
Neutrophils Relative %: 61 %
Platelets: 261 10*3/uL (ref 150–440)
RBC: 3.41 MIL/uL — ABNORMAL LOW (ref 4.40–5.90)
RDW: 13.7 % (ref 11.5–14.5)
WBC: 5.5 10*3/uL (ref 3.8–10.6)

## 2017-12-01 MED ORDER — SODIUM CHLORIDE 0.9 % IV SOLN
Freq: Once | INTRAVENOUS | Status: AC
  Administered 2017-12-01: 11:00:00 via INTRAVENOUS
  Filled 2017-12-01: qty 1000

## 2017-12-01 MED ORDER — ALPRAZOLAM 0.25 MG PO TABS: 0.2500 mg | ORAL_TABLET | Freq: Every evening | ORAL | 0 refills | Status: AC | PRN

## 2017-12-01 MED ORDER — PACLITAXEL PROTEIN-BOUND CHEMO INJECTION 100 MG
125.0000 mg/m2 | Freq: Once | INTRAVENOUS | Status: AC
  Administered 2017-12-01: 225 mg via INTRAVENOUS
  Filled 2017-12-01: qty 45

## 2017-12-01 MED ORDER — PROCHLORPERAZINE MALEATE 10 MG PO TABS
10.0000 mg | ORAL_TABLET | Freq: Once | ORAL | Status: AC
  Administered 2017-12-01: 10 mg via ORAL
  Filled 2017-12-01: qty 1

## 2017-12-01 MED ORDER — HEPARIN SOD (PORK) LOCK FLUSH 100 UNIT/ML IV SOLN
500.0000 [IU] | Freq: Once | INTRAVENOUS | Status: AC | PRN
  Administered 2017-12-01: 500 [IU]
  Filled 2017-12-01: qty 5

## 2017-12-01 NOTE — Progress Notes (Signed)
Patient reports is appetite has improved some, family is concerned about pt staying hydrated.

## 2017-12-02 LAB — CANCER ANTIGEN 19-9: CAN 19-9: 749 U/mL — AB (ref 0–35)

## 2017-12-08 ENCOUNTER — Inpatient Hospital Stay (HOSPITAL_BASED_OUTPATIENT_CLINIC_OR_DEPARTMENT_OTHER): Payer: Medicare Other | Admitting: Oncology

## 2017-12-08 ENCOUNTER — Inpatient Hospital Stay: Payer: Medicare Other

## 2017-12-08 ENCOUNTER — Inpatient Hospital Stay: Payer: Medicare Other | Attending: Oncology

## 2017-12-08 ENCOUNTER — Encounter: Payer: Self-pay | Admitting: Oncology

## 2017-12-08 VITALS — BP 120/70 | HR 61 | Temp 97.4°F | Resp 18 | Wt 141.0 lb

## 2017-12-08 DIAGNOSIS — Z8052 Family history of malignant neoplasm of bladder: Secondary | ICD-10-CM

## 2017-12-08 DIAGNOSIS — Z79899 Other long term (current) drug therapy: Secondary | ICD-10-CM

## 2017-12-08 DIAGNOSIS — C259 Malignant neoplasm of pancreas, unspecified: Secondary | ICD-10-CM | POA: Diagnosis not present

## 2017-12-08 DIAGNOSIS — N400 Enlarged prostate without lower urinary tract symptoms: Secondary | ICD-10-CM

## 2017-12-08 DIAGNOSIS — R21 Rash and other nonspecific skin eruption: Secondary | ICD-10-CM | POA: Diagnosis not present

## 2017-12-08 DIAGNOSIS — K5900 Constipation, unspecified: Secondary | ICD-10-CM

## 2017-12-08 DIAGNOSIS — F419 Anxiety disorder, unspecified: Secondary | ICD-10-CM | POA: Insufficient documentation

## 2017-12-08 DIAGNOSIS — L0390 Cellulitis, unspecified: Secondary | ICD-10-CM | POA: Insufficient documentation

## 2017-12-08 DIAGNOSIS — N3281 Overactive bladder: Secondary | ICD-10-CM | POA: Diagnosis not present

## 2017-12-08 DIAGNOSIS — Z8041 Family history of malignant neoplasm of ovary: Secondary | ICD-10-CM

## 2017-12-08 DIAGNOSIS — Z803 Family history of malignant neoplasm of breast: Secondary | ICD-10-CM

## 2017-12-08 DIAGNOSIS — R531 Weakness: Secondary | ICD-10-CM

## 2017-12-08 DIAGNOSIS — G2581 Restless legs syndrome: Secondary | ICD-10-CM

## 2017-12-08 DIAGNOSIS — E119 Type 2 diabetes mellitus without complications: Secondary | ICD-10-CM | POA: Insufficient documentation

## 2017-12-08 DIAGNOSIS — Z87891 Personal history of nicotine dependence: Secondary | ICD-10-CM

## 2017-12-08 DIAGNOSIS — R0602 Shortness of breath: Secondary | ICD-10-CM | POA: Insufficient documentation

## 2017-12-08 DIAGNOSIS — R5383 Other fatigue: Secondary | ICD-10-CM | POA: Insufficient documentation

## 2017-12-08 DIAGNOSIS — R32 Unspecified urinary incontinence: Secondary | ICD-10-CM

## 2017-12-08 DIAGNOSIS — L03115 Cellulitis of right lower limb: Secondary | ICD-10-CM

## 2017-12-08 DIAGNOSIS — Z8546 Personal history of malignant neoplasm of prostate: Secondary | ICD-10-CM

## 2017-12-08 DIAGNOSIS — Z800 Family history of malignant neoplasm of digestive organs: Secondary | ICD-10-CM | POA: Insufficient documentation

## 2017-12-08 LAB — CBC WITH DIFFERENTIAL/PLATELET
BASOS PCT: 1 %
Basophils Absolute: 0 10*3/uL (ref 0–0.1)
EOS ABS: 0.2 10*3/uL (ref 0–0.7)
Eosinophils Relative: 7 %
HCT: 32.7 % — ABNORMAL LOW (ref 40.0–52.0)
HEMOGLOBIN: 11.5 g/dL — AB (ref 13.0–18.0)
LYMPHS ABS: 1.2 10*3/uL (ref 1.0–3.6)
Lymphocytes Relative: 35 %
MCH: 35.5 pg — AB (ref 26.0–34.0)
MCHC: 35 g/dL (ref 32.0–36.0)
MCV: 101.3 fL — ABNORMAL HIGH (ref 80.0–100.0)
Monocytes Absolute: 0.2 10*3/uL (ref 0.2–1.0)
Monocytes Relative: 7 %
NEUTROS PCT: 50 %
Neutro Abs: 1.6 10*3/uL (ref 1.4–6.5)
Platelets: 243 10*3/uL (ref 150–440)
RBC: 3.23 MIL/uL — AB (ref 4.40–5.90)
RDW: 14.2 % (ref 11.5–14.5)
WBC: 3.3 10*3/uL — AB (ref 3.8–10.6)

## 2017-12-08 LAB — COMPREHENSIVE METABOLIC PANEL
ALT: 11 U/L — AB (ref 17–63)
AST: 17 U/L (ref 15–41)
Albumin: 3.1 g/dL — ABNORMAL LOW (ref 3.5–5.0)
Alkaline Phosphatase: 53 U/L (ref 38–126)
Anion gap: 8 (ref 5–15)
BUN: 16 mg/dL (ref 6–20)
CO2: 19 mmol/L — ABNORMAL LOW (ref 22–32)
CREATININE: 0.98 mg/dL (ref 0.61–1.24)
Calcium: 8.7 mg/dL — ABNORMAL LOW (ref 8.9–10.3)
Chloride: 109 mmol/L (ref 101–111)
Glucose, Bld: 135 mg/dL — ABNORMAL HIGH (ref 65–99)
Potassium: 3.9 mmol/L (ref 3.5–5.1)
Sodium: 136 mmol/L (ref 135–145)
Total Bilirubin: 0.4 mg/dL (ref 0.3–1.2)
Total Protein: 6.4 g/dL — ABNORMAL LOW (ref 6.5–8.1)

## 2017-12-08 MED ORDER — PACLITAXEL PROTEIN-BOUND CHEMO INJECTION 100 MG
125.0000 mg/m2 | Freq: Once | INTRAVENOUS | Status: AC
  Administered 2017-12-08: 225 mg via INTRAVENOUS
  Filled 2017-12-08: qty 45

## 2017-12-08 MED ORDER — HEPARIN SOD (PORK) LOCK FLUSH 100 UNIT/ML IV SOLN
500.0000 [IU] | Freq: Once | INTRAVENOUS | Status: AC | PRN
  Administered 2017-12-08: 500 [IU]
  Filled 2017-12-08: qty 5

## 2017-12-08 MED ORDER — SODIUM CHLORIDE 0.9 % IV SOLN
Freq: Once | INTRAVENOUS | Status: AC
  Administered 2017-12-08: 11:00:00 via INTRAVENOUS
  Filled 2017-12-08: qty 1000

## 2017-12-08 MED ORDER — PROCHLORPERAZINE MALEATE 10 MG PO TABS
10.0000 mg | ORAL_TABLET | Freq: Once | ORAL | Status: AC
  Administered 2017-12-08: 10 mg via ORAL
  Filled 2017-12-08: qty 1

## 2017-12-08 MED ORDER — TRIAMCINOLONE ACETONIDE 0.5 % EX OINT: 1.0000 "application " | TOPICAL_OINTMENT | Freq: Two times a day (BID) | CUTANEOUS | 0 refills | Status: DC

## 2017-12-08 NOTE — Progress Notes (Signed)
Patient here for follow up today with labs and treatment with Abraxane. He was seen by his PMD yesterday and started on Keflex for cellulitis of the left foot. His left foot/ankle/leg is visibly swollen. He reports having bi-lateral leg pain, worse at night, but denies having any pain at this moment.

## 2017-12-08 NOTE — Progress Notes (Signed)
[place]  [phone] [phone]  ID: Gerald Alvarez. OB: [birth year]  MR#: [id number]  JME#:[id number]  Patient Care Team: Rusty Aus, MD as PCP - General (Internal Medicine) Clent Jacks, RN as Registered Nurse   CHIEF COMPLAINT: Stage IIB pancreatic adenocarcinoma.  INTERVAL HISTORY: Patient returns to clinic today for further evaluation and consideration of cycle 2 day 15 of Abraxane only. Gemcitabine was discontinued for rash. Patient was recently seen by PCP for cellulitis of his left foot. He started on Keflex yesterday. He denies any fevers, chills or pain to the left leg. Patient states his appetite has increased since beginning Megace. He continues to have difficulty sleeping at night due to restless leg syndrome. He denies any neurological complaints. He denies any chest pain. He has occasional shortness of breath but this is a long-standing problem for him. He denies any nausea, vomiting or diarrhea. Has occasional constipation which he uses MiraLAX. He denies any melena or hematochezia. He denies urinary complaints.   REVIEW OF SYSTEMS:   Review of Systems  Constitutional: Positive for malaise/fatigue. Negative for chills, fever and weight loss.  HENT: Negative for congestion and ear pain.   Eyes: Negative.  Negative for blurred vision and double vision.  Respiratory: Positive for shortness of breath (mainly with exertion: Chronic). Negative for cough and sputum production.   Cardiovascular: Positive for leg swelling. Negative for chest pain and palpitations.  Gastrointestinal: Negative.  Negative for abdominal pain, constipation (occasional), diarrhea, nausea and vomiting.  Genitourinary: Negative for dysuria, frequency and urgency.  Musculoskeletal: Negative for back pain and falls.  Skin: Positive for rash (left ankle and foot).  Neurological: Negative.  Negative for weakness and headaches.  Endo/Heme/Allergies: Negative.   Does not bruise/bleed easily.  Psychiatric/Behavioral: Negative for depression. The patient is nervous/anxious (at times) and has insomnia.     As per HPI. Otherwise, a complete review of systems is negative.  PAST MEDICAL HISTORY: Past Medical History:  Diagnosis Date  . BPH (benign prostatic hyperplasia)   . Diabetes mellitus without complication ([place])   . Incontinence   . Nocturia   . OAB (overactive bladder)   . Pancreatic mass   . Prostate cancer ([place])     PAST SURGICAL HISTORY: Past Surgical History:  Procedure Laterality Date  . CATARACT EXTRACTION, BILATERAL Bilateral   . CHOLECYSTECTOMY    . ENDOSCOPIC RETROGRADE CHOLANGIOPANCREATOGRAPHY (ERCP) WITH PROPOFOL N/A 09/27/2017   Procedure: ENDOSCOPIC RETROGRADE CHOLANGIOPANCREATOGRAPHY (ERCP) WITH PROPOFOL;  Surgeon: Lucilla Lame, MD;  Location: [facility] ENDOSCOPY;  Service: Endoscopy;  Laterality: N/A;  . ERCP N/A 11/17/2017   Procedure: ENDOSCOPIC RETROGRADE CHOLANGIOPANCREATOGRAPHY (ERCP);  Surgeon: Lucilla Lame, MD;  Location: [facility] ENDOSCOPY;  Service: Endoscopy;  Laterality: N/A;  . HERNIA REPAIR     x 3  . PORTA CATH INSERTION N/A 10/22/2017   Procedure: PORTA CATH INSERTION;  Surgeon: Algernon Huxley, MD;  Location: [place] [facility] LAB;  Service: Cardiovascular;  Laterality: N/A;  . PROSTATECTOMY      FAMILY HISTORY: Family History  Problem Relation Age of Onset  . Prostate cancer Brother   . Bladder Cancer Brother   . Pancreatic cancer Brother   . Prostate cancer Brother   . Lymphoma Mother   . Leukemia Father   . Breast cancer Sister   . Uterine cancer Sister   . Kidney cancer Neg Hx     ADVANCED DIRECTIVES (Y/N):  N  HEALTH MAINTENANCE: Social History   Tobacco Use  .  Smoking status: Former Smoker    Last attempt to quit: 03/15/1967    Years since quitting: 50.7  . Smokeless tobacco: Former Systems developer    Types: Chew  . Tobacco comment: quit long time  Substance Use Topics  . Alcohol use: No    Alcohol/week:  0.0 oz  . Drug use: No     Colonoscopy:  PAP:  Bone density:  Lipid panel:  Allergies  Allergen Reactions  . Sulfa Antibiotics Nausea And Vomiting    Current Outpatient Medications  Medication Sig Dispense Refill  . ALPRAZolam (XANAX) 0.25 MG tablet Take 1 tablet (0.25 mg total) by mouth at bedtime as needed for anxiety. 30 tablet 0  . cephALEXin (KEFLEX) 500 MG capsule Take 500 mg by mouth 3 (three) times daily.    . Cholecalciferol (VITAMIN D3) 2000 units capsule Take 2,000 Units by mouth daily.     Marland Kitchen doxazosin (CARDURA) 2 MG tablet Take 2 mg by mouth daily.     . DULoxetine (CYMBALTA) 20 MG capsule Take 1 capsule by mouth daily.    . hydrOXYzine (VISTARIL) 25 MG capsule Take 12.5 mg by mouth 3 (three) times daily as needed for itching.    . lactulose (CHRONULAC) 10 GM/15ML solution Take by mouth 2 (two) times daily.     Marland Kitchen LEVEMIR FLEXTOUCH 100 UNIT/ML Pen Inject 15 Units into the skin daily at 10 pm. (Patient taking differently: Inject 5 Units into the skin daily at 10 pm. ) 15 mL   . lidocaine-prilocaine (EMLA) cream Apply to affected area once 30 g 3  . megestrol (MEGACE) 40 MG tablet Take 40 mg by mouth daily.  0  . Multiple Vitamin (MULTI-VITAMINS) TABS Take 1 tablet by mouth daily.     . ondansetron (ZOFRAN) 4 MG tablet Take 1 tablet (4 mg total) by mouth every 8 (eight) hours as needed for nausea or vomiting. 30 tablet 2  . pantoprazole (PROTONIX) 40 MG tablet TAKE 1 TABLET TWICE A DAY    . prochlorperazine (COMPAZINE) 10 MG tablet Take 1 tablet (10 mg total) by mouth every 6 (six) hours as needed (Nausea or vomiting). 60 tablet 2  . protein supplement shake (PREMIER PROTEIN) LIQD Take 325 mLs (11 oz total) by mouth 2 (two) times daily between meals. 30 Can 0  . sertraline (ZOLOFT) 50 MG tablet Take 1 tablet by mouth daily.    Marland Kitchen spironolactone (ALDACTONE) 25 MG tablet Take 25 mg by mouth daily.    . traZODone (DESYREL) 50 MG tablet Take 50 mg by mouth at bedtime.     .  vitamin B-12 (CYANOCOBALAMIN) 1000 MCG tablet Take 1,000 mcg by mouth.      No current facility-administered medications for this visit.     OBJECTIVE: There were no vitals filed for this visit.   There is no height or weight on file to calculate BMI.    ECOG FS:0 - Asymptomatic  Physical Exam  Constitutional: He is oriented to person, place, and time and well-developed, well-nourished, and in no distress. Vital signs are normal.  HENT:  Head: Normocephalic and atraumatic.  Eyes: Pupils are equal, round, and reactive to light.  Neck: Normal range of motion.  Cardiovascular: Normal rate, regular rhythm and normal heart sounds.  No murmur heard. Pulmonary/Chest: Effort normal and breath sounds normal. He has no wheezes.  Abdominal: Soft. Normal appearance and bowel sounds are normal. He exhibits no distension. There is no tenderness.  Musculoskeletal: Normal range of motion. He exhibits no  edema.  Neurological: He is alert and oriented to person, place, and time. Gait normal.  Skin: Skin is warm and dry. Ecchymosis and rash noted. Rash is macular. There is erythema.     Several leisons on bilateral lower extremities and upper extremities. They are not itchy or bothersome to patient.  Psychiatric: Mood, memory, affect and judgment normal.     LAB RESULTS:  Lab Results  Component Value Date   NA 135 12/01/2017   K 3.8 12/01/2017   CL 106 12/01/2017   CO2 22 12/01/2017   GLUCOSE 175 (H) 12/01/2017   BUN 20 12/01/2017   CREATININE 0.97 12/01/2017   CALCIUM 8.7 (L) 12/01/2017   PROT 6.4 (L) 12/01/2017   ALBUMIN 3.1 (L) 12/01/2017   AST 17 12/01/2017   ALT 11 (L) 12/01/2017   ALKPHOS 63 12/01/2017   BILITOT 0.5 12/01/2017   GFRNONAA >60 12/01/2017   GFRAA >60 12/01/2017    Lab Results  Component Value Date   WBC 3.3 (L) 12/08/2017   NEUTROABS 1.6 12/08/2017   HGB 11.5 (L) 12/08/2017   HCT 32.7 (L) 12/08/2017   MCV 101.3 (H) 12/08/2017   PLT 243 12/08/2017      STUDIES: Dg C-arm 1-60 Min-no Report  Result Date: 11/17/2017 Fluoroscopy was utilized by the requesting physician.  No radiographic interpretation.    ASSESSMENT: Stage IIB pancreatic adenocarcinoma  PLAN:    1.  Stage IIB pancreatic adenocarcinoma: MRI and MRCP results reviewed independently.  Patient recently had EUS at [facility] that confirmed adenocarcinoma.  CA 19-9 continues to trend up. Last from 12/01/2017 was 749. Pending from today. Patient is not a surgical candidate. Gemcitabine was discontinued secondary to rash. Continue with cycle 2 day 15 of Abraxane only. RTC in 2 weeks for labs and further evaluation by Dr. Grayland Ormond prior to consideration of cycle 3 day 1 of Abraxane only. Spoke at length with patient and family regarding neutropenic precautions given his white count was 3.3 and ANC 1.7. Fortunately, patient will have a week off before next treatment. Other than that labs looked pretty good today. 2.  Nausea: Continue Zofran and compazine as prescribed. 3.  Constipation: Continue MiraLAX when necessary. 4.  Rash: Mostly resolved after discontinuing gemcitabine. Patient continues to have small "sores" on legs and arms. RX triamcinolone 0.5% cream sent to pharmacy.  5.  Poor appetite: Improved. Megace seems to be helping. Weight is up 1 pound.  6.  Anxiety/sleep: Continue Xanax 0.25 mg at bedtime. 7. Cellulitis to left foot: Continue Keflex as prescribed by PCP. Has re-evaluation on  Thursday with PCP.   Patient expressed understanding and was in agreement with this plan. He also understands that He can call clinic at any time with any questions, concerns, or complaints.   Cancer Staging Pancreatic adenocarcinoma [facility]) Staging form: Exocrine Pancreas, AJCC 8th Edition - Clinical stage from 10/16/2017: Stage IIB (cT3, cN1, cM0) - Signed by Lloyd Huger, MD on 10/25/2017   Jacquelin Hawking, NP   12/08/2017 10:18 AM

## 2017-12-09 LAB — CANCER ANTIGEN 19-9: CAN 19-9: 385 U/mL — AB (ref 0–35)

## 2017-12-10 ENCOUNTER — Encounter: Payer: Self-pay | Admitting: Emergency Medicine

## 2017-12-10 ENCOUNTER — Emergency Department: Payer: Medicare Other

## 2017-12-10 ENCOUNTER — Inpatient Hospital Stay
Admission: EM | Admit: 2017-12-10 | Discharge: 2017-12-15 | DRG: 602 | Disposition: A | Discharge status: Skilled Nursing Facility | Payer: Medicare Other | Attending: Internal Medicine | Admitting: Internal Medicine

## 2017-12-10 ENCOUNTER — Other Ambulatory Visit: Payer: Self-pay

## 2017-12-10 DIAGNOSIS — I5022 Chronic systolic (congestive) heart failure: Secondary | ICD-10-CM | POA: Diagnosis present

## 2017-12-10 DIAGNOSIS — D61818 Other pancytopenia: Secondary | ICD-10-CM | POA: Diagnosis present

## 2017-12-10 DIAGNOSIS — E119 Type 2 diabetes mellitus without complications: Secondary | ICD-10-CM | POA: Diagnosis present

## 2017-12-10 DIAGNOSIS — I82432 Acute embolism and thrombosis of left popliteal vein: Secondary | ICD-10-CM

## 2017-12-10 DIAGNOSIS — Z882 Allergy status to sulfonamides status: Secondary | ICD-10-CM

## 2017-12-10 DIAGNOSIS — C259 Malignant neoplasm of pancreas, unspecified: Secondary | ICD-10-CM | POA: Diagnosis present

## 2017-12-10 DIAGNOSIS — R5383 Other fatigue: Secondary | ICD-10-CM | POA: Diagnosis not present

## 2017-12-10 DIAGNOSIS — N400 Enlarged prostate without lower urinary tract symptoms: Secondary | ICD-10-CM | POA: Diagnosis present

## 2017-12-10 DIAGNOSIS — F329 Major depressive disorder, single episode, unspecified: Secondary | ICD-10-CM | POA: Diagnosis present

## 2017-12-10 DIAGNOSIS — Z87891 Personal history of nicotine dependence: Secondary | ICD-10-CM

## 2017-12-10 DIAGNOSIS — L03116 Cellulitis of left lower limb: Secondary | ICD-10-CM | POA: Diagnosis not present

## 2017-12-10 DIAGNOSIS — Z794 Long term (current) use of insulin: Secondary | ICD-10-CM | POA: Diagnosis not present

## 2017-12-10 DIAGNOSIS — N3281 Overactive bladder: Secondary | ICD-10-CM | POA: Diagnosis present

## 2017-12-10 DIAGNOSIS — T451X5A Adverse effect of antineoplastic and immunosuppressive drugs, initial encounter: Secondary | ICD-10-CM | POA: Diagnosis present

## 2017-12-10 DIAGNOSIS — Z79899 Other long term (current) drug therapy: Secondary | ICD-10-CM | POA: Diagnosis not present

## 2017-12-10 DIAGNOSIS — D61810 Antineoplastic chemotherapy induced pancytopenia: Secondary | ICD-10-CM | POA: Diagnosis present

## 2017-12-10 DIAGNOSIS — Z9079 Acquired absence of other genital organ(s): Secondary | ICD-10-CM | POA: Diagnosis not present

## 2017-12-10 DIAGNOSIS — I429 Cardiomyopathy, unspecified: Secondary | ICD-10-CM | POA: Diagnosis not present

## 2017-12-10 DIAGNOSIS — K219 Gastro-esophageal reflux disease without esophagitis: Secondary | ICD-10-CM | POA: Diagnosis present

## 2017-12-10 DIAGNOSIS — F419 Anxiety disorder, unspecified: Secondary | ICD-10-CM | POA: Diagnosis present

## 2017-12-10 DIAGNOSIS — D72819 Decreased white blood cell count, unspecified: Secondary | ICD-10-CM

## 2017-12-10 DIAGNOSIS — Z8546 Personal history of malignant neoplasm of prostate: Secondary | ICD-10-CM

## 2017-12-10 DIAGNOSIS — E538 Deficiency of other specified B group vitamins: Secondary | ICD-10-CM | POA: Diagnosis not present

## 2017-12-10 DIAGNOSIS — R531 Weakness: Secondary | ICD-10-CM | POA: Diagnosis not present

## 2017-12-10 DIAGNOSIS — E559 Vitamin D deficiency, unspecified: Secondary | ICD-10-CM | POA: Diagnosis not present

## 2017-12-10 DIAGNOSIS — Z7901 Long term (current) use of anticoagulants: Secondary | ICD-10-CM | POA: Diagnosis not present

## 2017-12-10 DIAGNOSIS — L0390 Cellulitis, unspecified: Secondary | ICD-10-CM | POA: Diagnosis present

## 2017-12-10 LAB — URINALYSIS, COMPLETE (UACMP) WITH MICROSCOPIC
Bacteria, UA: NONE SEEN
Bilirubin Urine: NEGATIVE
GLUCOSE, UA: NEGATIVE mg/dL
Ketones, ur: NEGATIVE mg/dL
Leukocytes, UA: NEGATIVE
NITRITE: NEGATIVE
Protein, ur: 30 mg/dL — AB
SPECIFIC GRAVITY, URINE: 1.02 (ref 1.005–1.030)
Squamous Epithelial / LPF: NONE SEEN
pH: 5 (ref 5.0–8.0)

## 2017-12-10 LAB — COMPREHENSIVE METABOLIC PANEL
ALK PHOS: 49 U/L (ref 38–126)
ALT: 10 U/L — ABNORMAL LOW (ref 17–63)
AST: 17 U/L (ref 15–41)
Albumin: 3 g/dL — ABNORMAL LOW (ref 3.5–5.0)
Anion gap: 6 (ref 5–15)
BILIRUBIN TOTAL: 1.1 mg/dL (ref 0.3–1.2)
BUN: 16 mg/dL (ref 6–20)
CALCIUM: 8.5 mg/dL — AB (ref 8.9–10.3)
CO2: 21 mmol/L — AB (ref 22–32)
Chloride: 111 mmol/L (ref 101–111)
Creatinine, Ser: 0.73 mg/dL (ref 0.61–1.24)
GFR calc non Af Amer: >60 mL/min (ref >60)
Glucose, Bld: 116 mg/dL — ABNORMAL HIGH (ref 65–99)
POTASSIUM: 4 mmol/L (ref 3.5–5.1)
SODIUM: 138 mmol/L (ref 135–145)
TOTAL PROTEIN: 6.1 g/dL — AB (ref 6.5–8.1)

## 2017-12-10 LAB — CBC WITH DIFFERENTIAL/PLATELET
Basophils Absolute: 0 10*3/uL (ref 0–0.1)
Basophils Relative: 1 %
EOS ABS: 0.1 10*3/uL (ref 0–0.7)
Eosinophils Relative: 2 %
HCT: 32.4 % — ABNORMAL LOW (ref 40.0–52.0)
HEMOGLOBIN: 11.1 g/dL — AB (ref 13.0–18.0)
Lymphocytes Relative: 29 %
Lymphs Abs: 0.8 10*3/uL — ABNORMAL LOW (ref 1.0–3.6)
MCH: 34.5 pg — AB (ref 26.0–34.0)
MCHC: 34.3 g/dL (ref 32.0–36.0)
MCV: 100.6 fL — ABNORMAL HIGH (ref 80.0–100.0)
MONO ABS: 0.1 10*3/uL — AB (ref 0.2–1.0)
MONOS PCT: 4 %
NEUTROS PCT: 64 %
Neutro Abs: 1.7 10*3/uL (ref 1.4–6.5)
Platelets: 195 10*3/uL (ref 150–440)
RBC: 3.22 MIL/uL — ABNORMAL LOW (ref 4.40–5.90)
RDW: 14.4 % (ref 11.5–14.5)
WBC: 2.6 10*3/uL — ABNORMAL LOW (ref 3.8–10.6)

## 2017-12-10 LAB — LACTIC ACID, PLASMA: Lactic Acid, Venous: 0.8 mmol/L (ref 0.5–1.9)

## 2017-12-10 LAB — TROPONIN I: Troponin I: <0.03 ng/mL (ref <0.03)

## 2017-12-10 LAB — GLUCOSE, CAPILLARY: GLUCOSE-CAPILLARY: 123 mg/dL — AB (ref 65–99)

## 2017-12-10 MED ORDER — APIXABAN 5 MG PO TABS: 5.0000 mg | ORAL_TABLET | Freq: Two times a day (BID) | ORAL | Status: DC

## 2017-12-10 MED ORDER — VANCOMYCIN HCL IN DEXTROSE 1-5 GM/200ML-% IV SOLN: 1000.0000 mg | Freq: Once | INTRAVENOUS | Status: DC

## 2017-12-10 MED ORDER — WARFARIN SODIUM 10 MG PO TABS
10.0000 mg | ORAL_TABLET | Freq: Every day | ORAL | Status: DC
Start: 2017-12-10 — End: 2017-12-10

## 2017-12-10 MED ORDER — ACETAMINOPHEN 325 MG PO TABS: 650.0000 mg | ORAL_TABLET | Freq: Four times a day (QID) | ORAL | Status: DC | PRN

## 2017-12-10 MED ORDER — SODIUM CHLORIDE 0.9 % IV SOLN: 250.0000 mL | INTRAVENOUS | Status: DC | PRN

## 2017-12-10 MED ORDER — HYDROXYZINE HCL 25 MG PO TABS
12.5000 mg | ORAL_TABLET | Freq: Three times a day (TID) | ORAL | Status: DC | PRN
  Filled 2017-12-10: qty 1

## 2017-12-10 MED ORDER — VANCOMYCIN HCL IN DEXTROSE 1-5 GM/200ML-% IV SOLN
1000.0000 mg | INTRAVENOUS | Status: DC
  Administered 2017-12-11: 1000 mg via INTRAVENOUS
  Filled 2017-12-10: qty 200

## 2017-12-10 MED ORDER — WARFARIN - PHARMACIST DOSING INPATIENT
Freq: Every day | Status: DC
  Filled 2017-12-10 (×6): qty 1

## 2017-12-10 MED ORDER — ACETAMINOPHEN 650 MG RE SUPP: 650.0000 mg | Freq: Four times a day (QID) | RECTAL | Status: DC | PRN

## 2017-12-10 MED ORDER — SERTRALINE HCL 50 MG PO TABS
50.0000 mg | ORAL_TABLET | Freq: Every day | ORAL | Status: DC
  Administered 2017-12-11 – 2017-12-15 (×5): 50 mg via ORAL
  Filled 2017-12-10 (×5): qty 1

## 2017-12-10 MED ORDER — PANTOPRAZOLE SODIUM 40 MG PO TBEC
40.0000 mg | DELAYED_RELEASE_TABLET | Freq: Two times a day (BID) | ORAL | Status: DC
  Administered 2017-12-10 – 2017-12-15 (×10): 40 mg via ORAL
  Filled 2017-12-10 (×10): qty 1

## 2017-12-10 MED ORDER — HYDROCODONE-ACETAMINOPHEN 5-325 MG PO TABS: 1.0000 | ORAL_TABLET | ORAL | Status: DC | PRN

## 2017-12-10 MED ORDER — VANCOMYCIN HCL IN DEXTROSE 1-5 GM/200ML-% IV SOLN
1000.0000 mg | Freq: Once | INTRAVENOUS | Status: AC
  Administered 2017-12-10: 1000 mg via INTRAVENOUS
  Filled 2017-12-10: qty 200

## 2017-12-10 MED ORDER — SODIUM CHLORIDE 0.9% FLUSH: 3.0000 mL | INTRAVENOUS | Status: DC | PRN

## 2017-12-10 MED ORDER — PROCHLORPERAZINE MALEATE 10 MG PO TABS
10.0000 mg | ORAL_TABLET | Freq: Four times a day (QID) | ORAL | Status: DC | PRN
  Filled 2017-12-10: qty 1

## 2017-12-10 MED ORDER — WARFARIN SODIUM 5 MG PO TABS
5.0000 mg | ORAL_TABLET | Freq: Once | ORAL | Status: AC
Start: 2017-12-10 — End: 2017-12-10
  Administered 2017-12-10: 5 mg via ORAL
  Filled 2017-12-10: qty 1

## 2017-12-10 MED ORDER — DOXAZOSIN MESYLATE 2 MG PO TABS
2.0000 mg | ORAL_TABLET | Freq: Every day | ORAL | Status: DC
  Administered 2017-12-10: 22:00:00 2 mg via ORAL
  Filled 2017-12-10 (×2): qty 1

## 2017-12-10 MED ORDER — SODIUM CHLORIDE 0.9% FLUSH
3.0000 mL | Freq: Two times a day (BID) | INTRAVENOUS | Status: DC
  Administered 2017-12-10 – 2017-12-15 (×7): 3 mL via INTRAVENOUS

## 2017-12-10 MED ORDER — ENOXAPARIN SODIUM 80 MG/0.8ML ~~LOC~~ SOLN
1.0000 mg/kg | Freq: Two times a day (BID) | SUBCUTANEOUS | Status: DC
  Administered 2017-12-10 – 2017-12-11 (×2): 65 mg via SUBCUTANEOUS
  Filled 2017-12-10 (×3): qty 0.8

## 2017-12-10 MED ORDER — MORPHINE SULFATE (PF) 2 MG/ML IV SOLN
2.0000 mg | Freq: Once | INTRAVENOUS | Status: AC
  Administered 2017-12-10: 2 mg via INTRAVENOUS

## 2017-12-10 MED ORDER — SODIUM CHLORIDE 0.9 % IV SOLN
INTRAVENOUS | Status: DC
  Administered 2017-12-11: 01:00:00 via INTRAVENOUS

## 2017-12-10 MED ORDER — DULOXETINE HCL 20 MG PO CPEP
20.0000 mg | ORAL_CAPSULE | Freq: Every day | ORAL | Status: DC
  Administered 2017-12-11 – 2017-12-15 (×5): 20 mg via ORAL
  Filled 2017-12-10 (×5): qty 1

## 2017-12-10 MED ORDER — ADULT MULTIVITAMIN W/MINERALS CH
1.0000 | ORAL_TABLET | Freq: Every day | ORAL | Status: DC
  Administered 2017-12-11 – 2017-12-15 (×5): 1 via ORAL
  Filled 2017-12-10 (×5): qty 1

## 2017-12-10 MED ORDER — ENOXAPARIN SODIUM 80 MG/0.8ML ~~LOC~~ SOLN
1.0000 mg/kg | Freq: Two times a day (BID) | SUBCUTANEOUS | Status: DC
Start: 2017-12-10 — End: 2017-12-10

## 2017-12-10 MED ORDER — ONDANSETRON HCL 4 MG PO TABS: 4.0000 mg | ORAL_TABLET | Freq: Three times a day (TID) | ORAL | Status: DC | PRN

## 2017-12-10 MED ORDER — POLYETHYLENE GLYCOL 3350 17 G PO PACK: 17.0000 g | PACK | Freq: Every day | ORAL | Status: DC | PRN

## 2017-12-10 MED ORDER — MEGESTROL ACETATE 40 MG PO TABS
40.0000 mg | ORAL_TABLET | Freq: Every day | ORAL | Status: DC
  Administered 2017-12-11: 40 mg via ORAL
  Filled 2017-12-10: qty 1

## 2017-12-10 MED ORDER — PREMIER PROTEIN SHAKE
11.0000 [oz_av] | Freq: Two times a day (BID) | ORAL | Status: DC
  Administered 2017-12-11 – 2017-12-14 (×5): 11 [oz_av] via ORAL

## 2017-12-10 MED ORDER — INSULIN DETEMIR 100 UNIT/ML ~~LOC~~ SOLN
5.0000 [IU] | Freq: Every day | SUBCUTANEOUS | Status: DC
  Administered 2017-12-10: 22:00:00 5 [IU] via SUBCUTANEOUS
  Filled 2017-12-10 (×2): qty 0.05

## 2017-12-10 MED ORDER — APIXABAN 5 MG PO TABS: 10.0000 mg | ORAL_TABLET | Freq: Two times a day (BID) | ORAL | Status: DC

## 2017-12-10 MED ORDER — PROMETHAZINE HCL 25 MG PO TABS
12.5000 mg | ORAL_TABLET | Freq: Four times a day (QID) | ORAL | Status: DC | PRN
  Filled 2017-12-10: qty 1

## 2017-12-10 MED ORDER — LACTULOSE 10 GM/15ML PO SOLN
10.0000 g | Freq: Two times a day (BID) | ORAL | Status: DC
  Administered 2017-12-11 – 2017-12-12 (×2): 10 g via ORAL
  Filled 2017-12-10 (×2): qty 30

## 2017-12-10 MED ORDER — TRIAMCINOLONE ACETONIDE 0.5 % EX OINT
1.0000 "application " | TOPICAL_OINTMENT | Freq: Two times a day (BID) | CUTANEOUS | Status: DC
  Administered 2017-12-10 – 2017-12-15 (×9): 1 via TOPICAL
  Filled 2017-12-10: qty 15

## 2017-12-10 MED ORDER — VITAMIN B-12 1000 MCG PO TABS
1000.0000 ug | ORAL_TABLET | Freq: Every day | ORAL | Status: DC
  Administered 2017-12-11 – 2017-12-15 (×5): 1000 ug via ORAL
  Filled 2017-12-10 (×5): qty 1

## 2017-12-10 MED ORDER — ALPRAZOLAM 0.25 MG PO TABS
0.2500 mg | ORAL_TABLET | Freq: Every evening | ORAL | Status: DC | PRN
  Administered 2017-12-10 – 2017-12-11 (×2): 0.25 mg via ORAL
  Filled 2017-12-10 (×2): qty 1

## 2017-12-10 MED ORDER — SPIRONOLACTONE 25 MG PO TABS
25.0000 mg | ORAL_TABLET | Freq: Every day | ORAL | Status: DC
  Administered 2017-12-11 – 2017-12-15 (×5): 25 mg via ORAL
  Filled 2017-12-10 (×6): qty 1

## 2017-12-10 MED ORDER — VITAMIN D 1000 UNITS PO TABS
2000.0000 [IU] | ORAL_TABLET | Freq: Every day | ORAL | Status: DC
  Administered 2017-12-11 – 2017-12-15 (×5): 2000 [IU] via ORAL
  Filled 2017-12-10 (×5): qty 2

## 2017-12-10 MED ORDER — MORPHINE SULFATE (PF) 2 MG/ML IV SOLN
INTRAVENOUS | Status: AC
  Administered 2017-12-10: 2 mg via INTRAVENOUS
  Filled 2017-12-10: qty 1

## 2017-12-10 MED ORDER — TRAZODONE HCL 50 MG PO TABS
50.0000 mg | ORAL_TABLET | Freq: Every day | ORAL | Status: DC
  Administered 2017-12-10 – 2017-12-14 (×5): 50 mg via ORAL
  Filled 2017-12-10 (×5): qty 1

## 2017-12-10 NOTE — Consult Note (Signed)
Pharmacy Antibiotic Note  Gerald Alvarez. is a 84 y.o. male admitted on 12/10/2017 with cellulitis.  Pharmacy has been consulted for Vancomycin dosing. Patient received Vancomycin 1g x 1 dose in ED.   Plan: Ke: 0.048  T1/2: 14.4   Vd: 44.8  Start Vancomycin 1000 IV every 24 hours with 10 hour stack dosing.  Goal trough 10-15 mcg/mL. Calculated trough at Css is 12.5. Trough level before 4th dose.  Will monitor renal function and adjust dose as needed.     No data recorded.  Recent Labs  Lab 12/08/17 0955 12/10/17 1234 12/10/17 1320  WBC 3.3* 2.6*  --   CREATININE 0.98 0.73  --   LATICACIDVEN  --   --  0.8    Estimated Creatinine Clearance: 52.4 mL/min (by C-G formula based on SCr of 0.73 mg/dL).    Allergies  Allergen Reactions  . Sulfa Antibiotics Nausea And Vomiting    Antimicrobials this admission: 0404 Vancomycin >>  Microbiology results: 0404 BCx: pending   Thank you for allowing pharmacy to be a part of this patient's care.  Pernell Dupre, PharmD, BCPS Clinical Pharmacist 12/10/2017 7:22 PM

## 2017-12-10 NOTE — ED Triage Notes (Signed)
From Dr Ammie Ferrier office with redness, blistering, swelling to left foot. Pt states it started Monday and that he had a chemotherapy treatment on Tuesday. Pt alert & oriented with NAD noted.

## 2017-12-10 NOTE — Progress Notes (Signed)
Family Meeting Note  Advance Directive:yes  Today a meeting took place with the Patient, wife and numerous family members.  Patient is able to participate  The following clinical team members were present during this meeting:MD  The following were discussed:Patient's diagnosis: Stage II pancreatic cancer, Patient's progosis: Unable to determine and Goals for treatment: Full Code  Additional follow-up to be provided: prn  Time spent during discussion:20 minutes  Gorden Harms, MD

## 2017-12-10 NOTE — H&P (Signed)
[facility] NAME: Gerald Alvarez    MR#:  [id number]  DATE OF BIRTH:  [birth year]  DATE OF ADMISSION:  12/10/2017  PRIMARY CARE PHYSICIAN: Rusty Aus, MD   REQUESTING/REFERRING PHYSICIAN:   CHIEF COMPLAINT:  No chief complaint on file.   HISTORY OF PRESENT ILLNESS: Gerald Alvarez  is a 84 y.o. male with a known history stage II pancreatic cancer diagnosed January 2019, status post 6 rounds of chemotherapy-last treatment was on Tuesday, developed right foot swelling/redness/foot infection on Monday, sent to the emergency room by Dr. Sabra Heck for further evaluation, in the emergency room patient was found to have DVT as well, patient evaluated at the bedside, numerous family members present, patient is now been admitted for acute right lower extremity cellulitis inpatient that is immunocompromised secondary to cancer and acute lower extremity DVT.  PAST MEDICAL HISTORY:   Past Medical History:  Diagnosis Date  . BPH (benign prostatic hyperplasia)   . Diabetes mellitus without complication ([place])   . Incontinence   . Nocturia   . OAB (overactive bladder)   . Pancreatic mass   . Prostate cancer ([place])     PAST SURGICAL HISTORY:  Past Surgical History:  Procedure Laterality Date  . CATARACT EXTRACTION, BILATERAL Bilateral   . CHOLECYSTECTOMY    . ENDOSCOPIC RETROGRADE CHOLANGIOPANCREATOGRAPHY (ERCP) WITH PROPOFOL N/A 09/27/2017   Procedure: ENDOSCOPIC RETROGRADE CHOLANGIOPANCREATOGRAPHY (ERCP) WITH PROPOFOL;  Surgeon: Lucilla Lame, MD;  Location: [facility] ENDOSCOPY;  Service: Endoscopy;  Laterality: N/A;  . ERCP N/A 11/17/2017   Procedure: ENDOSCOPIC RETROGRADE CHOLANGIOPANCREATOGRAPHY (ERCP);  Surgeon: Lucilla Lame, MD;  Location: [facility] ENDOSCOPY;  Service: Endoscopy;  Laterality: N/A;  . HERNIA REPAIR     x 3  . PORTA CATH INSERTION N/A 10/22/2017   Procedure: PORTA CATH INSERTION;  Surgeon: Algernon Huxley, MD;  Location: [place] CV LAB;   Service: Cardiovascular;  Laterality: N/A;  . PROSTATECTOMY      SOCIAL HISTORY:  Social History   Tobacco Use  . Smoking status: Former Smoker    Last attempt to quit: 03/15/1967    Years since quitting: 50.7  . Smokeless tobacco: Former Systems developer    Types: Chew  . Tobacco comment: quit long time  Substance Use Topics  . Alcohol use: No    Alcohol/week: 0.0 oz    FAMILY HISTORY:  Family History  Problem Relation Age of Onset  . Prostate cancer Brother   . Bladder Cancer Brother   . Pancreatic cancer Brother   . Prostate cancer Brother   . Lymphoma Mother   . Leukemia Father   . Breast cancer Sister   . Uterine cancer Sister   . Kidney cancer Neg Hx     DRUG ALLERGIES:  Allergies  Allergen Reactions  . Sulfa Antibiotics Nausea And Vomiting    REVIEW OF SYSTEMS:   CONSTITUTIONAL: No fever,+ fatigue Arneta Cliche.  EYES: No blurred or double vision.  EARS, NOSE, AND THROAT: No tinnitus or ear pain.  RESPIRATORY: No cough, shortness of breath, wheezing or hemoptysis.  CARDIOVASCULAR: No chest pain, orthopnea, edema.  GASTROINTESTINAL: No nausea, vomiting, diarrhea or abdominal pain.  GENITOURINARY: No dysuria, hematuria.  ENDOCRINE: No polyuria, nocturia,  HEMATOLOGY: No anemia, easy bruising or bleeding SKIN: Right foot redness/swelling/blistering  mUSCULOSKELETAL: No joint pain or arthritis.   NEUROLOGIC: No tingling, numbness, weakness.  PSYCHIATRY: No anxiety or depression.   MEDICATIONS AT HOME:  Prior to Admission medications   Medication Sig Start  Date End Date Taking? Authorizing Provider  ALPRAZolam (XANAX) 0.25 MG tablet Take 1 tablet (0.25 mg total) by mouth at bedtime as needed for anxiety. 12/01/17  Yes Lloyd Huger, MD  Cholecalciferol (VITAMIN D3) 2000 units capsule Take 2,000 Units by mouth daily.  03/05/17  Yes [provider]  doxycycline (VIBRAMYCIN) 100 MG capsule Take 1 capsule by mouth 2 (two) times daily. 12/09/17  Yes [provider]  DULoxetine (CYMBALTA) 20 MG capsule Take 1 capsule by mouth daily. 09/12/17  Yes [provider]  LEVEMIR FLEXTOUCH 100 UNIT/ML Pen Inject 15 Units into the skin daily at 10 pm. Patient taking differently: Inject 5 Units into the skin daily at 10 pm.  09/28/17  Yes Wieting, Richard, MD  megestrol (MEGACE) 40 MG tablet Take 40 mg by mouth daily. 10/30/17  Yes [provider]  Multiple Vitamin (MULTI-VITAMINS) TABS Take 1 tablet by mouth daily.    Yes [provider]  pantoprazole (PROTONIX) 40 MG tablet TAKE 1 TABLET TWICE A DAY 02/08/15  Yes [provider]  sertraline (ZOLOFT) 50 MG tablet Take 1 tablet by mouth daily. 07/14/17  Yes [provider]  spironolactone (ALDACTONE) 25 MG tablet Take 25 mg by mouth daily.   Yes [provider]  traZODone (DESYREL) 50 MG tablet Take 50 mg by mouth at bedtime.  12/29/16  Yes [provider]  triamcinolone ointment (KENALOG) 0.5 % Apply 1 application topically 2 (two) times daily. 12/08/17  Yes Burns, Wandra Feinstein, NP  vitamin B-12 (CYANOCOBALAMIN) 1000 MCG tablet Take 1,000 mcg by mouth.    Yes [provider]  doxazosin (CARDURA) 2 MG tablet Take 2 mg by mouth daily.  12/31/16 12/31/17  [provider]  hydrOXYzine (VISTARIL) 25 MG capsule Take 12.5 mg by mouth 3 (three) times daily as needed for itching.    [provider]  lidocaine-prilocaine (EMLA) cream Apply to affected area once 10/16/17   Lloyd Huger, MD  ondansetron (ZOFRAN) 4 MG tablet Take 1 tablet (4 mg total) by mouth every 8 (eight) hours as needed for nausea or vomiting. 10/27/17   Lloyd Huger, MD  prochlorperazine (COMPAZINE) 10 MG tablet Take 1 tablet (10 mg total) by mouth every 6 (six) hours as needed (Nausea or vomiting). 10/16/17   Lloyd Huger, MD  protein supplement shake (PREMIER PROTEIN) LIQD Take 325 mLs (11 oz total) by mouth 2 (two) times daily between meals. 09/28/17    Loletha Grayer, MD      PHYSICAL EXAMINATION:   VITAL SIGNS: Blood pressure (!) 124/47, pulse 88, resp. rate 20, SpO2 99 %.  GENERAL:  84 y.o.-year-old patient lying in the bed with no acute distress.  Frail-appearing EYES: Pupils equal, round, reactive to light and accommodation. No scleral icterus. Extraocular muscles intact.  HEENT: Head atraumatic, normocephalic. Oropharynx and nasopharynx clear.  NECK:  Supple, no jugular venous distention. No thyroid enlargement, no tenderness.  LUNGS: Normal breath sounds bilaterally, no wheezing, rales,rhonchi or crepitation. No use of accessory muscles of respiration.  CARDIOVASCULAR: S1, S2 normal. No murmurs, rubs, or gallops.  ABDOMEN: Soft, nontender, nondistended. Bowel sounds present. No organomegaly or mass.  EXTREMITIES: No pedal edema, cyanosis, or clubbing.  NEUROLOGIC: Cranial nerves II through XII are intact. MAES. Gait not checked.  PSYCHIATRIC: The patient is alert and oriented x 3.  SKIN: No obvious rash, lesion, or ulcer.  Right foot cellulitis with bulla formation  LABORATORY PANEL:   CBC Recent Labs  Lab  12/08/17 0955 12/10/17 1234  WBC 3.3* 2.6*  HGB 11.5* 11.1*  HCT 32.7* 32.4*  PLT 243 195  MCV 101.3* 100.6*  MCH 35.5* 34.5*  MCHC 35.0 34.3  RDW 14.2 14.4  LYMPHSABS 1.2 0.8*  MONOABS 0.2 0.1*  EOSABS 0.2 0.1  BASOSABS 0.0 0.0   ------------------------------------------------------------------------------------------------------------------  Chemistries  Recent Labs  Lab 12/08/17 0955 12/10/17 1234  NA 136 138  K 3.9 4.0  CL 109 111  CO2 19* 21*  GLUCOSE 135* 116*  BUN 16 16  CREATININE 0.98 0.73  CALCIUM 8.7* 8.5*  AST 17 17  ALT 11* 10*  ALKPHOS 53 49  BILITOT 0.4 1.1   ------------------------------------------------------------------------------------------------------------------ estimated creatinine clearance is 52.4 mL/min (by C-G formula based on SCr of 0.73  mg/dL). ------------------------------------------------------------------------------------------------------------------ No results for input(s): TSH, T4TOTAL, T3FREE, THYROIDAB in the last 72 hours.  Invalid input(s): FREET3   Coagulation profile No results for input(s): INR, PROTIME in the last 168 hours. ------------------------------------------------------------------------------------------------------------------- No results for input(s): DDIMER in the last 72 hours. -------------------------------------------------------------------------------------------------------------------  Cardiac Enzymes Recent Labs  Lab 12/10/17 1234  TROPONINI <0.03   ------------------------------------------------------------------------------------------------------------------ Invalid input(s): POCBNP  ---------------------------------------------------------------------------------------------------------------  Urinalysis    Component Value Date/Time   COLORURINE YELLOW (A) 12/10/2017 1409   APPEARANCEUR CLEAR (A) 12/10/2017 1409   APPEARANCEUR Clear 03/15/2015 1517   LABSPEC 1.020 12/10/2017 1409   PHURINE 5.0 12/10/2017 1409   GLUCOSEU NEGATIVE 12/10/2017 1409   HGBUR SMALL (A) 12/10/2017 1409   BILIRUBINUR NEGATIVE 12/10/2017 1409   BILIRUBINUR Negative 03/15/2015 1517   KETONESUR NEGATIVE 12/10/2017 1409   PROTEINUR 30 (A) 12/10/2017 1409   NITRITE NEGATIVE 12/10/2017 1409   LEUKOCYTESUR NEGATIVE 12/10/2017 1409   LEUKOCYTESUR Negative 03/15/2015 1517     RADIOLOGY: Dg Chest 1 View  Result Date: 12/10/2017 CLINICAL DATA:  Dyspnea. EXAM: CHEST  1 VIEW COMPARISON:  CT scan of January 04, 2017. FINDINGS: The heart size and mediastinal contours are within normal limits. Atherosclerosis of thoracic aorta is noted. Right internal jugular Port-A-Cath is noted with distal tip in expected position of cavoatrial junction. No pneumothorax or pleural effusion is noted. Right lung is  clear. Minimal left basilar subsegmental atelectasis is noted. The visualized skeletal structures are unremarkable. IMPRESSION: Minimal left basilar subsegmental atelectasis. Aortic Atherosclerosis (ICD10-I70.0). Electronically Signed   By: Marijo Conception, M.D.   On: 12/10/2017 13:39   US Venous Img Lower Unilateral Left  Result Date: 12/10/2017 CLINICAL DATA:  Left lower extremity pain and edema. History of pancreatic cancer. Evaluate for DVT. EXAM: LEFT LOWER EXTREMITY VENOUS DOPPLER ULTRASOUND TECHNIQUE: Gray-scale sonography with graded compression, as well as color Doppler and duplex ultrasound were performed to evaluate the lower extremity deep venous systems from the level of the common femoral vein and including the common femoral, femoral, profunda femoral, popliteal and calf veins including the posterior tibial, peroneal and gastrocnemius veins when visible. The superficial great saphenous vein was also interrogated. Spectral Doppler was utilized to evaluate flow at rest and with distal augmentation maneuvers in the common femoral, femoral and popliteal veins. COMPARISON:  None. FINDINGS: Contralateral Common Femoral Vein: Respiratory phasicity is normal and symmetric with the symptomatic side. No evidence of thrombus. Normal compressibility. Common Femoral Vein: No evidence of thrombus. Normal compressibility, respiratory phasicity and response to augmentation. Saphenofemoral Junction: No evidence of thrombus. Normal compressibility and flow on color Doppler imaging. Profunda Femoral Vein: No evidence of thrombus. Normal compressibility and flow on color Doppler imaging. Femoral Vein: No evidence of thrombus. Normal compressibility,  respiratory phasicity and response to augmentation. Popliteal Vein: There is hypoechoic expansile occlusive thrombus within the left popliteal vein (images 29 through 32). Calf Veins: No evidence of thrombus. Normal compressibility and flow on color Doppler imaging.  Superficial Great Saphenous Vein: No evidence of thrombus. Normal compressibility. Other Findings:  None. IMPRESSION: Examination is positive for short segment occlusive DVT involving the left popliteal vein. Electronically Signed   By: Sandi Mariscal M.D.   On: 12/10/2017 16:22    EKG: Orders placed or performed during the hospital encounter of 11/09/17  . ED EKG  . ED EKG  . EKG    IMPRESSION AND PLAN: 1 acute left lower extremity cellulitis with bulla formation Compounded by immunosuppression due to stage II pancreatic adenocarcinoma Regular nursing floor bed on our cellulitis protocol, IV vancomycin for 5-7-day course, wound care nurse to evaluate/treat  2 acute left lower extremity DVT  most likely secondary to cancer Start Lovenox bridge to Coumadin, INR daily  3 chronic pancreatic adenocarcinoma stage II  status post chemotherapy 6 rounds-Last one was on Tuesday We will need to follow-up with oncology status post discharge for continued care/Dr. Grayland Ormond  4 chronic diabetes mellitus type 2 Continue Lantus, sliding scale insulin with Accu-Cheks per routine  5 history of systolic congestive heart failure without exacerbation Continue spironolactone, low-sodium/ADA/cardiac diet   All the records are reviewed and case discussed with ED provider. Management plans discussed with the patient, family and they are in agreement.  CODE STATUS:full Code Status History    Date Active Date Inactive Code Status Order ID Comments User Context   09/25/2017 2317 09/28/2017 1716 Full Code [id number]  Dustin Flock, MD Inpatient       TOTAL TIME TAKING CARE OF THIS PATIENT: 45 minutes.    Avel Peace [place] M.D on 12/10/2017   Between 7am to 6pm - Pager - [phone]  After 6pm go to [email or web address] - password [email or web address] [facility]  Office  [phone]  CC: Primary care physician; Rusty Aus, MD   Note: This dictation was prepared with Dragon dictation along  with smaller phrase technology. Any transcriptional errors that result from this process are unintentional.

## 2017-12-10 NOTE — Progress Notes (Signed)
Concern for safety r/t falls and patient being on blood thinner. With family's permission, all four side rails left up.

## 2017-12-10 NOTE — ED Provider Notes (Signed)
[facility] Emergency Department Provider Note       Time seen: ----------------------------------------- 12:18 PM on 12/10/2017 -----------------------------------------   I have reviewed the triage vital signs and the nursing notes.  HISTORY   Chief Complaint No chief complaint on file.    HPI Gerald Alvarez. is a 84 y.o. male with a history of diabetes, cardiomyopathy, pancreatic cancer currently on chemotherapy who presents to the ED for this, blistering and swelling to the left foot and left lower leg.  Family states that started on Monday and he received a chemotherapy treatment on Tuesday.  He did receive a shot of antibiotics the day prior.  He was seen by his primary care doctor today and sent to the ER for further evaluation.  This was thought to be possible neutropenic fever and cellulitis.  He has had some shortness of breath as well.  Past Medical History:  Diagnosis Date  . BPH (benign prostatic hyperplasia)   . Diabetes mellitus without complication ([place])   . Incontinence   . Nocturia   . OAB (overactive bladder)   . Pancreatic mass   . Prostate cancer [facility])     Patient Active Problem List   Diagnosis Date Noted  . Pancreatic adenocarcinoma ([place]) 10/13/2017  . Malnutrition of moderate degree 09/28/2017  . Common bile duct obstruction   . Pancreatic mass   . Jaundice 09/25/2017  . Low serum vitamin D 03/05/2017  . Adult idiopathic generalized osteoporosis 01/19/2017  . Vertebral compression fracture, with routine healing, subsequent encounter 01/19/2017  . B12 deficiency 10/23/2016  . Medicare annual wellness visit, initial 09/04/2016  . DD (diverticular disease) 03/10/2016  . Calculus of kidney 03/10/2016  . Bilateral tinnitus 03/10/2016  . Cardiomyopathy ([place]) 08/25/2014  . H/O malignant neoplasm of prostate 08/25/2014    Past Surgical History:  Procedure Laterality Date  . CATARACT EXTRACTION, BILATERAL Bilateral   .  CHOLECYSTECTOMY    . ENDOSCOPIC RETROGRADE CHOLANGIOPANCREATOGRAPHY (ERCP) WITH PROPOFOL N/A 09/27/2017   Procedure: ENDOSCOPIC RETROGRADE CHOLANGIOPANCREATOGRAPHY (ERCP) WITH PROPOFOL;  Surgeon: Lucilla Lame, MD;  Location: [facility] ENDOSCOPY;  Service: Endoscopy;  Laterality: N/A;  . ERCP N/A 11/17/2017   Procedure: ENDOSCOPIC RETROGRADE CHOLANGIOPANCREATOGRAPHY (ERCP);  Surgeon: Lucilla Lame, MD;  Location: [facility] ENDOSCOPY;  Service: Endoscopy;  Laterality: N/A;  . HERNIA REPAIR     x 3  . PORTA CATH INSERTION N/A 10/22/2017   Procedure: PORTA CATH INSERTION;  Surgeon: Algernon Huxley, MD;  Location: [facility] CV LAB;  Service: Cardiovascular;  Laterality: N/A;  . PROSTATECTOMY      Allergies Sulfa antibiotics  Social History Social History   Tobacco Use  . Smoking status: Former Smoker    Last attempt to quit: 03/15/1967    Years since quitting: 50.7  . Smokeless tobacco: Former Systems developer    Types: Chew  . Tobacco comment: quit long time  Substance Use Topics  . Alcohol use: No    Alcohol/week: 0.0 oz  . Drug use: No   Review of Systems Constitutional: Negative for fever. Cardiovascular: Negative for chest pain. Respiratory: Positive for shortness of breath Gastrointestinal: Negative for abdominal pain, vomiting and diarrhea. Musculoskeletal: Positive for left leg pain and swelling Skin: Positive for left leg erythema with bulla formation on the medial left ankle Neurological: Negative for headaches, positive for generalized weakness  All systems negative/normal/unremarkable except as stated in the HPI  ____________________________________________   PHYSICAL EXAM:  VITAL SIGNS: ED Triage Vitals  Enc Vitals Group  BP      Pulse      Resp      Temp      Temp src      SpO2      Weight      Height      Head Circumference      Peak Flow      Pain Score      Pain Loc      Pain Edu?      Excl. in [place]?    Constitutional: Alert and oriented.  No distress Eyes:  Conjunctivae are normal. Normal extraocular movements. ENT   Head: Normocephalic and atraumatic.   Nose: No congestion/rhinnorhea.   Mouth/Throat: Mucous membranes are moist.   Neck: No stridor. Cardiovascular: Normal rate, irregular rhythm. No murmurs, rubs, or gallops. Respiratory: Normal respiratory effort without tachypnea nor retractions. Breath sounds are clear and equal bilaterally. No wheezes/rales/rhonchi. Gastrointestinal: Soft and nontender. Normal bowel sounds Musculoskeletal: Tender with limited range of motion of the lower extremity, extensive edema and erythema particularly of the left lower extremity.  There is a large bulla formed around the left medial malleolus Neurologic:  Normal speech and language. No gross focal neurologic deficits are appreciated.  Skin: Erythema, edema and bulla formation on the left foot Psychiatric: Mood and affect are normal. Speech and behavior are normal.  ____________________________________________  ED COURSE:  As part of my medical decision making, I reviewed the following data within the [place] History obtained from family if available, nursing notes, old chart and ekg, as well as notes from prior ED visits. Patient presented for left leg pain and swelling, we will assess with labs and imaging as indicated at this time.   Procedures ____________________________________________   LABS (pertinent positives/negatives)  Labs Reviewed  CBC WITH DIFFERENTIAL/PLATELET - Abnormal; Notable for the following components:      Result Value   WBC 2.6 (*)    RBC 3.22 (*)    Hemoglobin 11.1 (*)    HCT 32.4 (*)    MCV 100.6 (*)    MCH 34.5 (*)    Lymphs Abs 0.8 (*)    Monocytes Absolute 0.1 (*)    All other components within normal limits  COMPREHENSIVE METABOLIC PANEL - Abnormal; Notable for the following components:   CO2 21 (*)    Glucose, Bld 116 (*)    Calcium 8.5 (*)    Total Protein 6.1 (*)    Albumin  3.0 (*)    ALT 10 (*)    All other components within normal limits  URINALYSIS, COMPLETE (UACMP) WITH MICROSCOPIC - Abnormal; Notable for the following components:   Color, Urine YELLOW (*)    APPearance CLEAR (*)    Hgb urine dipstick SMALL (*)    Protein, ur 30 (*)    All other components within normal limits  CULTURE, BLOOD (ROUTINE X 2)  CULTURE, BLOOD (ROUTINE X 2)  TROPONIN I  LACTIC ACID, PLASMA    RADIOLOGY Images were viewed by me  US venous Is pending at this time Chest x-ray IMPRESSION: Minimal left basilar subsegmental atelectasis.  Aortic Atherosclerosis (ICD10-I70.0). ____________________________________________  DIFFERENTIAL DIAGNOSIS   DVT, cellulitis, sepsis, drug toxicity  FINAL ASSESSMENT AND PLAN  Left lower extremity pain and edema   Plan: The patient had presented for left leg edema with erythema and blistering. Patient's labs do reveal worsening neutropenia likely reflective of his chemotherapy. Patient's imaging is pending at this time.   Laurence Aly, MD  Note: This note was generated in part or whole with voice recognition software. Voice recognition is usually quite accurate but there are transcription errors that can and very often do occur. I apologize for any typographical errors that were not detected and corrected.     Earleen Newport, MD 12/10/17 [phone]

## 2017-12-10 NOTE — Progress Notes (Addendum)
[place] for warfarin Indication: DVT  Allergies  Allergen Reactions  . Sulfa Antibiotics Nausea And Vomiting    Vital Signs: BP: 124/47 (04/04 1500) Pulse Rate: 88 (04/04 1500)  Labs: Recent Labs    12/08/17 0955 12/10/17 1234  HGB 11.5* 11.1*  HCT 32.7* 32.4*  PLT 243 195  CREATININE 0.98 0.73  TROPONINI  --  <0.03    Estimated Creatinine Clearance: 52.4 mL/min (by C-G formula based on SCr of 0.73 mg/dL).   Medical History: Past Medical History:  Diagnosis Date  . BPH (benign prostatic hyperplasia)   . Diabetes mellitus without complication ([place])   . Incontinence   . Nocturia   . OAB (overactive bladder)   . Pancreatic mass   . Prostate cancer [facility])     Assessment: 84 year old male presents to ED with swelling to the left foot and left lower leg. Per MD note, patient is ultrasound positive for occlusive DVT. Patient has history of pancreatic cancer, diagnosed Jan 2019.   Goal of Therapy:  Monitor platelets by anticoagulation protocol: Yes   Plan:  Bridge with therapeutic lovenox until warfarin therapeutic. Will give one dose of warfarin 5mg  this evening and check daily INR's.   Candelaria Stagers, PharmD Pharmacy Resident  12/10/2017,5:13 PM

## 2017-12-10 NOTE — ED Notes (Signed)
Attempted report to floor. Primary RN unavailable, states she will call back.

## 2017-12-10 NOTE — ED Provider Notes (Addendum)
-----------------------------------------   4:51 PM on 12/10/2017 -----------------------------------------  Patient care assumed from Dr. Jimmye Norman.  I have personally seen and evaluated the patient, he does have a very erythematous tender and swollen left lower extremity with a large blister to the medial aspect.  Patient is ultrasound positive for occlusive DVT.  Patient states he is not been able to ambulate since yesterday due to the pain in his leg.  Patient is on chemotherapy with a current absolute neutrophil count of 1.7.  Given the occlusive clot with significant lower extremity pain and swelling we will start the patient on eliquis.  Given the blistering redness tenderness and inability to ambulate even though the ultrasound is positive for DVT we will also cover for the possibility of cellulitis given his immunocompromise state.  Patient family agreeable to this plan of care.  We will admit to the hospitalist service for further treatment.   Harvest Dark, MD 12/10/17 1653    Harvest Dark, MD 12/10/17 [phone]

## 2017-12-11 ENCOUNTER — Telehealth: Payer: Self-pay | Admitting: Oncology

## 2017-12-11 DIAGNOSIS — I429 Cardiomyopathy, unspecified: Secondary | ICD-10-CM

## 2017-12-11 DIAGNOSIS — N400 Enlarged prostate without lower urinary tract symptoms: Secondary | ICD-10-CM

## 2017-12-11 DIAGNOSIS — R531 Weakness: Secondary | ICD-10-CM

## 2017-12-11 DIAGNOSIS — C259 Malignant neoplasm of pancreas, unspecified: Secondary | ICD-10-CM

## 2017-12-11 DIAGNOSIS — L03116 Cellulitis of left lower limb: Principal | ICD-10-CM

## 2017-12-11 DIAGNOSIS — R5383 Other fatigue: Secondary | ICD-10-CM

## 2017-12-11 DIAGNOSIS — Z7901 Long term (current) use of anticoagulants: Secondary | ICD-10-CM

## 2017-12-11 DIAGNOSIS — E559 Vitamin D deficiency, unspecified: Secondary | ICD-10-CM

## 2017-12-11 DIAGNOSIS — Z87891 Personal history of nicotine dependence: Secondary | ICD-10-CM

## 2017-12-11 DIAGNOSIS — I82432 Acute embolism and thrombosis of left popliteal vein: Secondary | ICD-10-CM

## 2017-12-11 DIAGNOSIS — E538 Deficiency of other specified B group vitamins: Secondary | ICD-10-CM

## 2017-12-11 DIAGNOSIS — E119 Type 2 diabetes mellitus without complications: Secondary | ICD-10-CM

## 2017-12-11 DIAGNOSIS — Z794 Long term (current) use of insulin: Secondary | ICD-10-CM

## 2017-12-11 LAB — CBC
HEMATOCRIT: 32.8 % — AB (ref 40.0–52.0)
HEMOGLOBIN: 11.3 g/dL — AB (ref 13.0–18.0)
MCH: 35 pg — ABNORMAL HIGH (ref 26.0–34.0)
MCHC: 34.4 g/dL (ref 32.0–36.0)
MCV: 101.8 fL — AB (ref 80.0–100.0)
Platelets: 181 10*3/uL (ref 150–440)
RBC: 3.22 MIL/uL — AB (ref 4.40–5.90)
RDW: 14.7 % — ABNORMAL HIGH (ref 11.5–14.5)
WBC: 2.6 10*3/uL — AB (ref 3.8–10.6)

## 2017-12-11 LAB — GLUCOSE, CAPILLARY
Glucose-Capillary: 82 mg/dL (ref 65–99)
Glucose-Capillary: 110 mg/dL — ABNORMAL HIGH (ref 65–99)
Glucose-Capillary: 183 mg/dL — ABNORMAL HIGH (ref 65–99)
Glucose-Capillary: 152 mg/dL — ABNORMAL HIGH (ref 65–99)

## 2017-12-11 LAB — PROTIME-INR
INR: 1.16
Prothrombin Time: 14.7 seconds (ref 11.4–15.2)

## 2017-12-11 MED ORDER — APIXABAN 5 MG PO TABS: 5.0000 mg | ORAL_TABLET | Freq: Two times a day (BID) | ORAL | Status: DC

## 2017-12-11 MED ORDER — PIPERACILLIN-TAZOBACTAM 3.375 G IVPB
3.3750 g | Freq: Three times a day (TID) | INTRAVENOUS | Status: DC
  Administered 2017-12-11 – 2017-12-15 (×12): 3.375 g via INTRAVENOUS
  Filled 2017-12-11 (×13): qty 50

## 2017-12-11 MED ORDER — INSULIN ASPART 100 UNIT/ML ~~LOC~~ SOLN: 0.0000 [IU] | Freq: Every day | SUBCUTANEOUS | Status: DC

## 2017-12-11 MED ORDER — APIXABAN 5 MG PO TABS
10.0000 mg | ORAL_TABLET | Freq: Two times a day (BID) | ORAL | Status: DC
  Administered 2017-12-11 – 2017-12-15 (×8): 10 mg via ORAL
  Filled 2017-12-11 (×8): qty 2

## 2017-12-11 MED ORDER — WARFARIN SODIUM 5 MG PO TABS
5.0000 mg | ORAL_TABLET | Freq: Once | ORAL | Status: DC
  Filled 2017-12-11: qty 1

## 2017-12-11 MED ORDER — HALOPERIDOL LACTATE 5 MG/ML IJ SOLN
1.0000 mg | Freq: Four times a day (QID) | INTRAMUSCULAR | Status: DC | PRN
Start: 2017-12-11 — End: 2017-12-15
  Filled 2017-12-11: qty 1

## 2017-12-11 MED ORDER — QUETIAPINE FUMARATE 25 MG PO TABS
25.0000 mg | ORAL_TABLET | Freq: Once | ORAL | Status: DC
  Filled 2017-12-11: qty 1

## 2017-12-11 MED ORDER — SODIUM CHLORIDE 0.9 % IV SOLN
1250.0000 mg | INTRAVENOUS | Status: DC
  Administered 2017-12-12 – 2017-12-14 (×3): 1250 mg via INTRAVENOUS
  Filled 2017-12-11 (×3): qty 1250

## 2017-12-11 MED ORDER — SODIUM CHLORIDE 0.9 % IV BOLUS: 500.0000 mL | Freq: Once | INTRAVENOUS | Status: DC

## 2017-12-11 MED ORDER — INSULIN ASPART 100 UNIT/ML ~~LOC~~ SOLN
0.0000 [IU] | Freq: Three times a day (TID) | SUBCUTANEOUS | Status: DC
Start: 2017-12-11 — End: 2017-12-15
  Administered 2017-12-11 – 2017-12-12 (×2): 2 [IU] via SUBCUTANEOUS
  Administered 2017-12-12 (×2): 1 [IU] via SUBCUTANEOUS
  Administered 2017-12-13: 2 [IU] via SUBCUTANEOUS
  Administered 2017-12-13: 1 [IU] via SUBCUTANEOUS
  Administered 2017-12-13 – 2017-12-14 (×2): 2 [IU] via SUBCUTANEOUS
  Administered 2017-12-14: 1 [IU] via SUBCUTANEOUS
  Administered 2017-12-14: 13:00:00 2 [IU] via SUBCUTANEOUS
  Administered 2017-12-15 (×2): 1 [IU] via SUBCUTANEOUS
  Filled 2017-12-11 (×12): qty 1

## 2017-12-11 MED ORDER — WARFARIN SODIUM 4 MG PO TABS
4.0000 mg | ORAL_TABLET | Freq: Once | ORAL | Status: DC
  Filled 2017-12-11: qty 1

## 2017-12-11 NOTE — Progress Notes (Signed)
[facility] NAME: Gerald Alvarez    MR#:  [id number]  DATE OF BIRTH:  23-Dec-1925  SUBJECTIVE:   Patient here with foot cellulitis and large blister  REVIEW OF SYSTEMS:    Review of Systems  Constitutional: Negative for fever, chills weight loss HENT: Negative for ear pain, nosebleeds, congestion, facial swelling, rhinorrhea, neck pain, neck stiffness and ear discharge.   Respiratory: Negative for cough, shortness of breath, wheezing  Cardiovascular: Negative for chest pain, palpitations and leg swelling.  Gastrointestinal: Negative for heartburn, abdominal pain, vomiting, diarrhea or consitpation Genitourinary: Negative for dysuria, urgency, frequency, hematuria Musculoskeletal: Negative for back pain or joint pain Neurological: Negative for dizziness, seizures, syncope, focal weakness,  numbness and headaches.  Hematological: Does not bruise/bleed easily.  Psychiatric/Behavioral: Negative for hallucinations, confusion, dysphoric mood SKIN red and warm with blister   Tolerating Diet:yes      DRUG ALLERGIES:   Allergies  Allergen Reactions  . Sulfa Antibiotics Nausea And Vomiting    VITALS:  Blood pressure 112/61, pulse (!) 105, temperature 97.6 F (36.4 C), temperature source Oral, resp. rate 18, height 5\' 4"  (1.626 m), weight 63.5 kg (140 lb), SpO2 100 %.  PHYSICAL EXAMINATION:  Constitutional: Appears well-developed and well-nourished. No distress. HENT: Normocephalic. Marland Kitchen Oropharynx is clear and moist.  Eyes: Conjunctivae and EOM are normal. PERRLA, no scleral icterus.  Neck: Normal ROM. Neck supple. No JVD. No tracheal deviation. CVS: RRR, S1/S2 +, no murmurs, no gallops, no carotid bruit.  Pulmonary: Effort and breath sounds normal, no stridor, rhonchi, wheezes, rales.  Abdominal: Soft. BS +,  no distension, tenderness, rebound or guarding.  Musculoskeletal: Normal range of motion. No edema and no tenderness.   Neuro: Alert. CN 2-12 grossly intact. No focal deficits. Skin: left foot with large blister medial aspect with redness and warmth  Psychiatric: Normal mood and affect.      LABORATORY PANEL:   CBC Recent Labs  Lab 12/11/17 0805  WBC 2.6*  HGB 11.3*  HCT 32.8*  PLT 181   ------------------------------------------------------------------------------------------------------------------  Chemistries  Recent Labs  Lab 12/10/17 1234  NA 138  K 4.0  CL 111  CO2 21*  GLUCOSE 116*  BUN 16  CREATININE 0.73  CALCIUM 8.5*  AST 17  ALT 10*  ALKPHOS 49  BILITOT 1.1   ------------------------------------------------------------------------------------------------------------------  Cardiac Enzymes Recent Labs  Lab 12/10/17 1234  TROPONINI <0.03   ------------------------------------------------------------------------------------------------------------------  RADIOLOGY:  Dg Chest 1 View  Result Date: 12/10/2017 CLINICAL DATA:  Dyspnea. EXAM: CHEST  1 VIEW COMPARISON:  CT scan of January 04, 2017. FINDINGS: The heart size and mediastinal contours are within normal limits. Atherosclerosis of thoracic aorta is noted. Right internal jugular Port-A-Cath is noted with distal tip in expected position of cavoatrial junction. No pneumothorax or pleural effusion is noted. Right lung is clear. Minimal left basilar subsegmental atelectasis is noted. The visualized skeletal structures are unremarkable. IMPRESSION: Minimal left basilar subsegmental atelectasis. Aortic Atherosclerosis (ICD10-I70.0). Electronically Signed   By: Marijo Conception, M.D.   On: 12/10/2017 13:39   US Venous Img Lower Unilateral Left  Result Date: 12/10/2017 CLINICAL DATA:  Left lower extremity pain and edema. History of pancreatic cancer. Evaluate for DVT. EXAM: LEFT LOWER EXTREMITY VENOUS DOPPLER ULTRASOUND TECHNIQUE: Gray-scale sonography with graded compression, as well as color Doppler and duplex ultrasound were  performed to evaluate the lower extremity deep venous systems from the level of the common femoral vein and including the common  femoral, femoral, profunda femoral, popliteal and calf veins including the posterior tibial, peroneal and gastrocnemius veins when visible. The superficial great saphenous vein was also interrogated. Spectral Doppler was utilized to evaluate flow at rest and with distal augmentation maneuvers in the common femoral, femoral and popliteal veins. COMPARISON:  None. FINDINGS: Contralateral Common Femoral Vein: Respiratory phasicity is normal and symmetric with the symptomatic side. No evidence of thrombus. Normal compressibility. Common Femoral Vein: No evidence of thrombus. Normal compressibility, respiratory phasicity and response to augmentation. Saphenofemoral Junction: No evidence of thrombus. Normal compressibility and flow on color Doppler imaging. Profunda Femoral Vein: No evidence of thrombus. Normal compressibility and flow on color Doppler imaging. Femoral Vein: No evidence of thrombus. Normal compressibility, respiratory phasicity and response to augmentation. Popliteal Vein: There is hypoechoic expansile occlusive thrombus within the left popliteal vein (images 29 through 32). Calf Veins: No evidence of thrombus. Normal compressibility and flow on color Doppler imaging. Superficial Great Saphenous Vein: No evidence of thrombus. Normal compressibility. Other Findings:  None. IMPRESSION: Examination is positive for short segment occlusive DVT involving the left popliteal vein. Electronically Signed   By: Sandi Mariscal M.D.   On: 12/10/2017 16:22     ASSESSMENT AND PLAN:   84 year old male with a history of pancreatic adenocarcinoma who presents with redness  and blistering of the left foot.   1 Acute left lower extremity cellulitis with blister Continue IV vancomycin and Zosyn Elevate legs Podiatry consultation requested  2 Acute left lower extremity DVT: Continue  Coumadin and Lovenox Oncology evaluation requested to see if patient would be a candidate for Eliquis instead of Coumadin   3 pancreatic adenocarcinoma stage II  status post chemotherapy 6 rounds-Last one was on Tuesday Oncology evaluation requested.  4 Diabetes mellitus type 2 Continue  sliding scale insulin with Accu-Cheks per routine 5.  GERD: Continue PPI  6.  Depression/anxiety: Continue Xanax and Cymbalta   7.  Decreased white blood cell likely from cancer treatment and cellulitis CBC for a.m.   Management plans discussed with the patient and family and they are in agreement.  CODE STATUS: FULL  TOTAL TIME TAKING CARE OF THIS PATIENT: 30 minutes.     POSSIBLE D/C 1-2 days, DEPENDING ON CLINICAL CONDITION.   Woodfin Kiss M.D on 12/11/2017 at 11:50 AM  Between 7am to 6pm - Pager - [phone] After 6pm go to [email or web address] - password [email or web address] [facility]  Office  [phone]  CC: Primary care physician; Rusty Aus, MD  Note: This dictation was prepared with Dragon dictation along with smaller phrase technology. Any transcriptional errors that result from this process are unintentional.

## 2017-12-11 NOTE — Consult Note (Signed)
Hematology/Oncology Consult note [facility] Telephone:([phone] [phone]  Patient Care Team: Rusty Aus, MD as PCP - General (Internal Medicine) Clent Jacks, RN as Registered Nurse   Name of the patient: Gerald Alvarez  [id number]  07/13/1926   Date of visit: 12/11/17 REASON FOR COSULTATION:  DVT History of presenting illness-  This is 84 years old male with a known history of pancreatic cancer currently on chemotherapy Abraxane who was sent to the hospital due to right foot swelling/redness/foot infection by Dr. Sabra Heck for further evaluation.  In the emergency room patient was found to have left lower extremity Short segment occlusive DVT involving left popliteal vein.  Patient was started on Lovenox injection, and Coumadin was started also.  Patient has already been started on Coumadin.  INR on 12/10/17 was 1.16.  [place] was consulted for evaluation and to see if DOAC is indicated.  Patient is also getting IV vancomycin and Zosyn for left lower extremity cellulitis with blister. Patient was seen and examined at the bedside.  He is hard of hearing.  Most medical history was provided by his daughter and wife who were at the bedside.   Review of systems- Review of Systems  Constitutional: Positive for malaise/fatigue. Negative for chills and fever.  Cardiovascular: Negative for chest pain.  Gastrointestinal: Negative for nausea and vomiting.  Skin: Negative for rash.  Endo/Heme/Allergies: Does not bruise/bleed easily.  Cannot obtain full review of systems due to patient's hard of hearing.  Allergies  Allergen Reactions  . Sulfa Antibiotics Nausea And Vomiting    Patient Active Problem List   Diagnosis Date Noted  . Cellulitis 12/10/2017  . Pancreatic adenocarcinoma ([place]) 10/13/2017  . Malnutrition of moderate degree 09/28/2017  . Common bile duct obstruction   . Pancreatic mass   . Jaundice 09/25/2017  . Low serum vitamin D  03/05/2017  . Adult idiopathic generalized osteoporosis 01/19/2017  . Vertebral compression fracture, with routine healing, subsequent encounter 01/19/2017  . B12 deficiency 10/23/2016  . Medicare annual wellness visit, initial 09/04/2016  . DD (diverticular disease) 03/10/2016  . Calculus of kidney 03/10/2016  . Bilateral tinnitus 03/10/2016  . Cardiomyopathy ([place]) 08/25/2014  . H/O malignant neoplasm of prostate 08/25/2014     Past Medical History:  Diagnosis Date  . BPH (benign prostatic hyperplasia)   . Diabetes mellitus without complication ([place])   . Incontinence   . Nocturia   . OAB (overactive bladder)   . Pancreatic mass   . Prostate cancer [facility])      Past Surgical History:  Procedure Laterality Date  . CATARACT EXTRACTION, BILATERAL Bilateral   . CHOLECYSTECTOMY    . ENDOSCOPIC RETROGRADE CHOLANGIOPANCREATOGRAPHY (ERCP) WITH PROPOFOL N/A 09/27/2017   Procedure: ENDOSCOPIC RETROGRADE CHOLANGIOPANCREATOGRAPHY (ERCP) WITH PROPOFOL;  Surgeon: Lucilla Lame, MD;  Location: [facility] ENDOSCOPY;  Service: Endoscopy;  Laterality: N/A;  . ERCP N/A 11/17/2017   Procedure: ENDOSCOPIC RETROGRADE CHOLANGIOPANCREATOGRAPHY (ERCP);  Surgeon: Lucilla Lame, MD;  Location: [facility] ENDOSCOPY;  Service: Endoscopy;  Laterality: N/A;  . HERNIA REPAIR     x 3  . PORTA CATH INSERTION N/A 10/22/2017   Procedure: PORTA CATH INSERTION;  Surgeon: Algernon Huxley, MD;  Location: [place] CV LAB;  Service: Cardiovascular;  Laterality: N/A;  . PROSTATECTOMY      Social History   Socioeconomic History  . Marital status: Married    Spouse name: Not on file  . Number of children: Not on file  . Years of education: Not on  file  . Highest education level: Not on file  Occupational History  . Not on file  Social Needs  . Financial resource strain: Not on file  . Food insecurity:    Worry: Not on file    Inability: Not on file  . Transportation needs:    Medical: Not on file    Non-medical: Not on  file  Tobacco Use  . Smoking status: Former Smoker    Last attempt to quit: 03/15/1967    Years since quitting: 50.7  . Smokeless tobacco: Former Systems developer    Types: Chew  . Tobacco comment: quit long time  Substance and Sexual Activity  . Alcohol use: No    Alcohol/week: 0.0 oz  . Drug use: No  . Sexual activity: Never  Lifestyle  . Physical activity:    Days per week: Not on file    Minutes per session: Not on file  . Stress: Not on file  Relationships  . Social connections:    Talks on phone: Not on file    Gets together: Not on file    Attends religious service: Not on file    Active member of club or organization: Not on file    Attends meetings of clubs or organizations: Not on file    Relationship status: Not on file  . Intimate partner violence:    Fear of current or ex partner: Not on file    Emotionally abused: Not on file    Physically abused: Not on file    Forced sexual activity: Not on file  Other Topics Concern  . Not on file  Social History Narrative  . Not on file     Family History  Problem Relation Age of Onset  . Prostate cancer Brother   . Bladder Cancer Brother   . Pancreatic cancer Brother   . Prostate cancer Brother   . Lymphoma Mother   . Leukemia Father   . Breast cancer Sister   . Uterine cancer Sister   . Kidney cancer Neg Hx      Current Facility-Administered Medications:  .  0.9 %  sodium chloride infusion, 250 mL, Intravenous, PRN, Salary, Montell D, MD .  acetaminophen (TYLENOL) tablet 650 mg, 650 mg, Oral, Q6H PRN **OR** acetaminophen (TYLENOL) suppository 650 mg, 650 mg, Rectal, Q6H PRN, Salary, Montell D, MD .  ALPRAZolam Duanne Moron) tablet 0.25 mg, 0.25 mg, Oral, QHS PRN, Salary, Montell D, MD, 0.25 mg at 12/10/17 2201 .  cholecalciferol (VITAMIN D) tablet 2,000 Units, 2,000 Units, Oral, Daily, Salary, Montell D, MD, 2,000 Units at 12/11/17 0904 .  DULoxetine (CYMBALTA) DR capsule 20 mg, 20 mg, Oral, Daily, Salary, Montell D, MD, 20 mg  at 12/11/17 0905 .  enoxaparin (LOVENOX) injection 65 mg, 1 mg/kg, Subcutaneous, Q12H, Salary, Montell D, MD, 65 mg at 12/11/17 5009 .  haloperidol lactate (HALDOL) injection 1 mg, 1 mg, Intravenous, Q6H PRN, Lance Coon, MD .  HYDROcodone-acetaminophen (NORCO/VICODIN) 5-325 MG per tablet 1-2 tablet, 1-2 tablet, Oral, Q4H PRN, Salary, Montell D, MD .  hydrOXYzine (ATARAX/VISTARIL) tablet 12.5 mg, 12.5 mg, Oral, TID PRN, Salary, Montell D, MD .  insulin aspart (novoLOG) injection 0-5 Units, 0-5 Units, Subcutaneous, QHS, Mody, Sital, MD .  insulin aspart (novoLOG) injection 0-9 Units, 0-9 Units, Subcutaneous, TID WC, Mody, Sital, MD .  lactulose (CHRONULAC) 10 GM/15ML solution 10 g, 10 g, Oral, BID, Salary, Montell D, MD, 10 g at 12/11/17 0905 .  multivitamin with minerals tablet 1 tablet, 1  tablet, Oral, Daily, Salary, Montell D, MD, 1 tablet at 12/11/17 0904 .  ondansetron (ZOFRAN) tablet 4 mg, 4 mg, Oral, Q8H PRN, Salary, Montell D, MD .  pantoprazole (PROTONIX) EC tablet 40 mg, 40 mg, Oral, BID, Salary, Montell D, MD, 40 mg at 12/11/17 0904 .  piperacillin-tazobactam (ZOSYN) IVPB 3.375 g, 3.375 g, Intravenous, Q8H, Rocky Morel, RPH, Last Rate: 12.5 mL/hr at 12/11/17 1253, 3.375 g at 12/11/17 1253 .  polyethylene glycol (MIRALAX / GLYCOLAX) packet 17 g, 17 g, Oral, Daily PRN, Salary, Montell D, MD .  prochlorperazine (COMPAZINE) tablet 10 mg, 10 mg, Oral, Q6H PRN, Salary, Montell D, MD .  promethazine (PHENERGAN) tablet 12.5 mg, 12.5 mg, Oral, Q6H PRN, Salary, Montell D, MD .  protein supplement (PREMIER PROTEIN) liquid, 11 oz, Oral, BID BM, Salary, Montell D, MD, 11 oz at 12/11/17 1457 .  QUEtiapine (SEROQUEL) tablet 25 mg, 25 mg, Oral, Once, Lance Coon, MD .  sertraline (ZOLOFT) tablet 50 mg, 50 mg, Oral, Daily, Salary, Montell D, MD, 50 mg at 12/11/17 0905 .  sodium chloride flush (NS) 0.9 % injection 3 mL, 3 mL, Intravenous, Q12H, Salary, Montell D, MD, 3 mL at 12/10/17 2207 .  sodium  chloride flush (NS) 0.9 % injection 3 mL, 3 mL, Intravenous, PRN, Salary, Montell D, MD .  spironolactone (ALDACTONE) tablet 25 mg, 25 mg, Oral, Daily, Salary, Montell D, MD, 25 mg at 12/11/17 0905 .  traZODone (DESYREL) tablet 50 mg, 50 mg, Oral, QHS, Salary, Montell D, MD, 50 mg at 12/10/17 2200 .  triamcinolone ointment (KENALOG) 0.5 % 1 application, 1 application, Topical, BID, Salary, Montell D, MD, 1 application at 45/80/99 1112 .  [START ON 12/12/2017] vancomycin (VANCOCIN) 1,250 mg in sodium chloride 0.9 % 250 mL IVPB, 1,250 mg, Intravenous, Q24H, Mody, Sital, MD .  vitamin B-12 (CYANOCOBALAMIN) tablet 1,000 mcg, 1,000 mcg, Oral, Daily, Salary, Montell D, MD, 1,000 mcg at 12/11/17 0905 .  warfarin (COUMADIN) tablet 4 mg, 4 mg, Oral, ONCE-1800, Rocky Morel, RPH .  Warfarin - Pharmacist Dosing Inpatient, , Does not apply, q1800, Candelaria Stagers, [facility]   Physical exam:  Vitals:   12/10/17 2152 12/11/17 0552 12/11/17 0926 12/11/17 0944  BP: (!) 128/58 (!) 111/43 (!) 97/45 112/61  Pulse: 82 97 (!) 115 (!) 105  Resp:   18   Temp:  97.6 F (36.4 C)    TempSrc:  Oral    SpO2:  100% 100%   Weight:      Height:       Constitutional: Appears well-developed and well-nourished. No distress. HENT: Normocephalic. Marland Kitchen Oropharynx is clear and moist.  Eyes: Conjunctivae and EOM are normal. PERRLA, no scleral icterus.  Neck: Normal ROM. Neck supple. No JVD. No tracheal deviation. CVS: RRR, S1/S2 +, no murmurs, no gallops, no carotid bruit.  Pulmonary: Effort and breath sounds normal, no stridor, rhonchi, wheezes, rales.  Abdominal: Soft. BS +,  no distension, tenderness, rebound or guarding.  Musculoskeletal: Normal range of motion.  Neuro: Alert. CN 2-12 grossly intact. No focal deficits. Skin: left foot with large blister medial aspect with redness and warmth  Psychiatric: Normal mood and affect.        CMP Latest Ref Rng & Units 12/10/2017  Glucose 65 - 99 mg/dL 116(H)  BUN 6 - 20 mg/dL  16  Creatinine 0.61 - 1.24 mg/dL 0.73  Sodium 135 - 145 mmol/L 138  Potassium 3.5 - 5.1 mmol/L 4.0  Chloride 101 - 111 mmol/L 111  CO2 22 - 32 mmol/L 21(L)  Calcium 8.9 - 10.3 mg/dL 8.5(L)  Total Protein 6.5 - 8.1 g/dL 6.1(L)  Total Bilirubin 0.3 - 1.2 mg/dL 1.1  Alkaline Phos 38 - 126 U/L 49  AST 15 - 41 U/L 17  ALT 17 - 63 U/L 10(L)   CBC Latest Ref Rng & Units 12/11/2017  WBC 3.8 - 10.6 K/uL 2.6(L)  Hemoglobin 13.0 - 18.0 g/dL 11.3(L)  Hematocrit 40.0 - 52.0 % 32.8(L)  Platelets 150 - 440 K/uL 181     Dg Chest 1 View  Result Date: 12/10/2017 CLINICAL DATA:  Dyspnea. EXAM: CHEST  1 VIEW COMPARISON:  CT scan of January 04, 2017. FINDINGS: The heart size and mediastinal contours are within normal limits. Atherosclerosis of thoracic aorta is noted. Right internal jugular Port-A-Cath is noted with distal tip in expected position of cavoatrial junction. No pneumothorax or pleural effusion is noted. Right lung is clear. Minimal left basilar subsegmental atelectasis is noted. The visualized skeletal structures are unremarkable. IMPRESSION: Minimal left basilar subsegmental atelectasis. Aortic Atherosclerosis (ICD10-I70.0). Electronically Signed   By: Marijo Conception, M.D.   On: 12/10/2017 13:39   US Venous Img Lower Unilateral Left  Result Date: 12/10/2017 CLINICAL DATA:  Left lower extremity pain and edema. History of pancreatic cancer. Evaluate for DVT. EXAM: LEFT LOWER EXTREMITY VENOUS DOPPLER ULTRASOUND TECHNIQUE: Gray-scale sonography with graded compression, as well as color Doppler and duplex ultrasound were performed to evaluate the lower extremity deep venous systems from the level of the common femoral vein and including the common femoral, femoral, profunda femoral, popliteal and calf veins including the posterior tibial, peroneal and gastrocnemius veins when visible. The superficial great saphenous vein was also interrogated. Spectral Doppler was utilized to evaluate flow at rest and with  distal augmentation maneuvers in the common femoral, femoral and popliteal veins. COMPARISON:  None. FINDINGS: Contralateral Common Femoral Vein: Respiratory phasicity is normal and symmetric with the symptomatic side. No evidence of thrombus. Normal compressibility. Common Femoral Vein: No evidence of thrombus. Normal compressibility, respiratory phasicity and response to augmentation. Saphenofemoral Junction: No evidence of thrombus. Normal compressibility and flow on color Doppler imaging. Profunda Femoral Vein: No evidence of thrombus. Normal compressibility and flow on color Doppler imaging. Femoral Vein: No evidence of thrombus. Normal compressibility, respiratory phasicity and response to augmentation. Popliteal Vein: There is hypoechoic expansile occlusive thrombus within the left popliteal vein (images 29 through 32). Calf Veins: No evidence of thrombus. Normal compressibility and flow on color Doppler imaging. Superficial Great Saphenous Vein: No evidence of thrombus. Normal compressibility. Other Findings:  None. IMPRESSION: Examination is positive for short segment occlusive DVT involving the left popliteal vein. Electronically Signed   By: Sandi Mariscal M.D.   On: 12/10/2017 16:22   Dg C-arm 1-60 Min-no Report  Result Date: 11/17/2017 Fluoroscopy was utilized by the requesting physician.  No radiographic interpretation.    Assessment and plan- Patient is a 84 y.o. male with stage IIb pancreatic adenocarcinoma, currently on single agent Abraxane currently admitted for left lower extremity cellulitis, and left lower extremity DVT.  # Lower extremity DVT in cancer: DVT is likely triggered by patient's hypercoagulable states secondary to cancer, and acute cellulitis.  Result reported at [facility] 2018 fromADAM VTE trial showed that Eliquis is in a safe and effective option in treating VTE inpatient undergoing cancer therapy.  Eliquis is associated with fewer major bleeding events and if your recurrent  blood clots compared to low molecular weight heparin.   Will  recommend switching to Eliquis.  Ordered Eliquis pharmacy consult. Plan was discussed with patient and the patient's family at bedside.  they voices understanding and willing to proceed. Discussed with Dr. Benjie Karvonen about above plan. #Left lower extremity cellulitis managed per primary care. #Pancreatic cancer: No inpatient intervention.  Discussed with patient that he needs to follow-up with Dr. Grayland Ormond outpatient.  Dr. Benjie Karvonen, thank you for allowing me to participate in the care of this patient.   Earlie Server, MD, PhD Hematology Oncology [facility] at [facility] Pager- [phone] 12/11/2017

## 2017-12-11 NOTE — Progress Notes (Signed)
[place] for warfarin Indication: DVT  Allergies  Allergen Reactions  . Sulfa Antibiotics Nausea And Vomiting    Vital Signs: Temp: 97.6 F (36.4 C) (04/05 0552) Temp Source: Oral (04/05 0552) BP: 112/61 (04/05 0944) Pulse Rate: 105 (04/05 0944)  Labs: Recent Labs    12/10/17 1234 12/11/17 0805  HGB 11.1* 11.3*  HCT 32.4* 32.8*  PLT 195 181  LABPROT  --  14.7  INR  --  1.16  CREATININE 0.73  --   TROPONINI <0.03  --     Estimated Creatinine Clearance: 52.4 mL/min (by C-G formula based on SCr of 0.73 mg/dL).   Assessment: 84 year old male presents to ED with swelling to the left foot and left lower leg. Per MD note, patient is ultrasound positive for occlusive DVT. Patient has history of pancreatic cancer, diagnosed Jan 2019.   Pt is eating well per RN.    Goal of Therapy:  Monitor platelets by anticoagulation protocol: Yes   Plan:  Elderly pt with low-ish body weight now with pink-tinged urine per RN. RN called MD who stated to continue warfarin and enoxaparin. Patient is now being treated with apixaban.  Hgb/plt count stable from yesterday. Will need CBC at least every three days per policy.   4/5 Patient is being switched from lovenox/warfarin to apixaban for treatment of DVT. Hgb has been stable for past few days. Will start apixaban 10mg  BID x 7 days followed by apixaban 5mg  BID.   Marland KitchenLendon Ka, PharmD Pharmacy Resident 12/11/2017 5:21 PM

## 2017-12-11 NOTE — Progress Notes (Deleted)
[place] for warfarin Indication: DVT  Allergies  Allergen Reactions  . Sulfa Antibiotics Nausea And Vomiting    Vital Signs: Temp: 97.6 F (36.4 C) (04/05 0552) Temp Source: Oral (04/05 0552) BP: 111/43 (04/05 0552) Pulse Rate: 97 (04/05 0552)  Labs: Recent Labs    12/08/17 0955 12/10/17 1234 12/11/17 0805  HGB 11.5* 11.1* 11.3*  HCT 32.7* 32.4* 32.8*  PLT 243 195 181  LABPROT  --   --  14.7  INR  --   --  1.16  CREATININE 0.98 0.73  --   TROPONINI  --  <0.03  --     Estimated Creatinine Clearance: 52.4 mL/min (by C-G formula based on SCr of 0.73 mg/dL).   Assessment: 84 year old male presents to ED with swelling to the left foot and left lower leg. Per MD note, patient is ultrasound positive for occlusive DVT. Patient has history of pancreatic cancer, diagnosed Jan 2019.   Bridging with therapeutic lovenox until warfarin therapeutic x2 and 5 days overlap.   4/4  No INR Warfarin 5 mg 4/5  INR 1.16   Goal of Therapy:  Monitor platelets by anticoagulation protocol: Yes   Plan:  Will repeat dose of warfarin 5mg  this evening (no baseline INR to assess trend) and check daily INR's.  Hgb/plt count stable from yesterday. Will need CBC at least every three days per policy.   Rayna Sexton, PharmD, BCPS Clinical Pharmacist 12/11/2017 8:56 AM

## 2017-12-11 NOTE — Consult Note (Signed)
[facility]                                                      Patient Demographics  Gerald Alvarez, is a 84 y.o. male   MRN: [id number]   DOB - 1925-09-24  Admit Date - 12/10/2017    Outpatient Primary MD for the patient is Rusty Aus, MD  Consult requested in the Hospital by Bettey Costa, MD, On 12/11/2017    Reason for consult cellulitis to the left foot along with a blister to the left ankle   With History of -  Past Medical History:  Diagnosis Date  . BPH (benign prostatic hyperplasia)   . Diabetes mellitus without complication ([place])   . Incontinence   . Nocturia   . OAB (overactive bladder)   . Pancreatic mass   . Prostate cancer [facility])       Past Surgical History:  Procedure Laterality Date  . CATARACT EXTRACTION, BILATERAL Bilateral   . CHOLECYSTECTOMY    . ENDOSCOPIC RETROGRADE CHOLANGIOPANCREATOGRAPHY (ERCP) WITH PROPOFOL N/A 09/27/2017   Procedure: ENDOSCOPIC RETROGRADE CHOLANGIOPANCREATOGRAPHY (ERCP) WITH PROPOFOL;  Surgeon: Lucilla Lame, MD;  Location: [facility] ENDOSCOPY;  Service: Endoscopy;  Laterality: N/A;  . ERCP N/A 11/17/2017   Procedure: ENDOSCOPIC RETROGRADE CHOLANGIOPANCREATOGRAPHY (ERCP);  Surgeon: Lucilla Lame, MD;  Location: [facility] ENDOSCOPY;  Service: Endoscopy;  Laterality: N/A;  . HERNIA REPAIR     x 3  . PORTA CATH INSERTION N/A 10/22/2017   Procedure: PORTA CATH INSERTION;  Surgeon: Algernon Huxley, MD;  Location: [place] CV LAB;  Service: Cardiovascular;  Laterality: N/A;  . PROSTATECTOMY      in for   No chief complaint on file.    HPI  Gerald Alvarez  is a 84 y.o. male, patient was admitted yesterday with cellulitis to the left foot and lower leg.  Has a large blister on the left ankle medially as well.  Family is with him uncertain as to where this came from.    Review of Systems patient is alert and in  good spirits.  His family is with him also.  He is communicative.  Patient does have pancreatic cancer and is under treatment for this.  He is significantly immunocompromised.  In addition to the HPI above,  No Fever-chills, No Headache, No changes with Vision or hearing, No problems swallowing food or Liquids, No Chest pain, Cough or Shortness of Breath, No Abdominal pain, No Nausea or Vommitting, Bowel movements are regular, No Blood in stool or Urine, No dysuria, No new skin rashes or bruises, No new joints pains-aches,  No new weakness, tingling, numbness in any extremity, No recent weight gain or loss, No polyuria, polydypsia or polyphagia, No significant Mental Stressors.  A full 10 point Review of Systems was done, except as stated above, all other Review of Systems were negative.   Social History Social History   Tobacco Use  . Smoking status: Former Smoker    Last attempt to quit: 03/15/1967    Years since quitting: 50.7  . Smokeless tobacco: Former Systems developer    Types: Chew  . Tobacco comment: quit long time  Substance Use Topics  . Alcohol use: No    Alcohol/week: 0.0 oz    Family History Family History  Problem Relation Age of Onset  . Prostate cancer  Brother   . Bladder Cancer Brother   . Pancreatic cancer Brother   . Prostate cancer Brother   . Lymphoma Mother   . Leukemia Father   . Breast cancer Sister   . Uterine cancer Sister   . Kidney cancer Neg Hx     Prior to Admission medications   Medication Sig Start Date End Date Taking? Authorizing Provider  ALPRAZolam (XANAX) 0.25 MG tablet Take 1 tablet (0.25 mg total) by mouth at bedtime as needed for anxiety. 12/01/17  Yes Lloyd Huger, MD  Cholecalciferol (VITAMIN D3) 2000 units capsule Take 2,000 Units by mouth daily.  03/05/17  Yes [provider]  doxycycline (VIBRAMYCIN) 100 MG capsule Take 1 capsule by mouth 2 (two) times daily. 12/09/17  Yes [provider]  DULoxetine (CYMBALTA)  20 MG capsule Take 1 capsule by mouth daily. 09/12/17  Yes [provider]  LEVEMIR FLEXTOUCH 100 UNIT/ML Pen Inject 15 Units into the skin daily at 10 pm. Patient taking differently: Inject 5 Units into the skin daily at 10 pm.  09/28/17  Yes Wieting, Richard, MD  megestrol (MEGACE) 40 MG tablet Take 40 mg by mouth daily. 10/30/17  Yes [provider]  Multiple Vitamin (MULTI-VITAMINS) TABS Take 1 tablet by mouth daily.    Yes [provider]  pantoprazole (PROTONIX) 40 MG tablet TAKE 1 TABLET TWICE A DAY 02/08/15  Yes [provider]  sertraline (ZOLOFT) 50 MG tablet Take 1 tablet by mouth daily. 07/14/17  Yes [provider]  spironolactone (ALDACTONE) 25 MG tablet Take 25 mg by mouth daily.   Yes [provider]  traZODone (DESYREL) 50 MG tablet Take 50 mg by mouth at bedtime.  12/29/16  Yes [provider]  triamcinolone ointment (KENALOG) 0.5 % Apply 1 application topically 2 (two) times daily. 12/08/17  Yes Burns, Wandra Feinstein, NP  vitamin B-12 (CYANOCOBALAMIN) 1000 MCG tablet Take 1,000 mcg by mouth.    Yes [provider]  doxazosin (CARDURA) 2 MG tablet Take 2 mg by mouth daily.  12/31/16 12/31/17  [provider]  hydrOXYzine (VISTARIL) 25 MG capsule Take 12.5 mg by mouth 3 (three) times daily as needed for itching.    [provider]  lidocaine-prilocaine (EMLA) cream Apply to affected area once 10/16/17   Lloyd Huger, MD  ondansetron (ZOFRAN) 4 MG tablet Take 1 tablet (4 mg total) by mouth every 8 (eight) hours as needed for nausea or vomiting. 10/27/17   Lloyd Huger, MD  prochlorperazine (COMPAZINE) 10 MG tablet Take 1 tablet (10 mg total) by mouth every 6 (six) hours as needed (Nausea or vomiting). 10/16/17   Lloyd Huger, MD  protein supplement shake (PREMIER PROTEIN) LIQD Take 325 mLs (11 oz total) by mouth 2 (two) times daily between meals. 09/28/17   Loletha Grayer, MD     Anti-infectives (From admission, onward)   Start     Dose/Rate Route Frequency Ordered Stop   12/12/17 0400  vancomycin (VANCOCIN) 1,250 mg in sodium chloride 0.9 % 250 mL IVPB     1,250 mg 166.7 mL/hr over 90 Minutes Intravenous Every 24 hours 12/11/17 0923     12/11/17 1200  piperacillin-tazobactam (ZOSYN) IVPB 3.375 g     3.375 g 12.5 mL/hr over 240 Minutes Intravenous Every 8 hours 12/11/17 1141     12/11/17 0400  vancomycin (VANCOCIN) IVPB 1000 mg/200 mL premix  Status:  Discontinued     1,000 mg 200 mL/hr over 60 Minutes  Intravenous Every 24 hours 12/10/17 1924 12/11/17 0923   12/10/17 1745  vancomycin (VANCOCIN) IVPB 1000 mg/200 mL premix  Status:  Discontinued     1,000 mg 200 mL/hr over 60 Minutes Intravenous  Once 12/10/17 1730 12/10/17 1730   12/10/17 1745  vancomycin (VANCOCIN) IVPB 1000 mg/200 mL premix  Status:  Discontinued     1,000 mg 200 mL/hr over 60 Minutes Intravenous  Once 12/10/17 1731 12/10/17 1746   12/10/17 1700  vancomycin (VANCOCIN) IVPB 1000 mg/200 mL premix     1,000 mg 200 mL/hr over 60 Minutes Intravenous  Once 12/10/17 1650 12/10/17 1847      Scheduled Meds: . cholecalciferol  2,000 Units Oral Daily  . DULoxetine  20 mg Oral Daily  . enoxaparin (LOVENOX) injection  1 mg/kg Subcutaneous Q12H  . insulin aspart  0-5 Units Subcutaneous QHS  . insulin aspart  0-9 Units Subcutaneous TID WC  . lactulose  10 g Oral BID  . multivitamin with minerals  1 tablet Oral Daily  . pantoprazole  40 mg Oral BID  . protein supplement shake  11 oz Oral BID BM  . QUEtiapine  25 mg Oral Once  . sertraline  50 mg Oral Daily  . sodium chloride flush  3 mL Intravenous Q12H  . spironolactone  25 mg Oral Daily  . traZODone  50 mg Oral QHS  . triamcinolone ointment  1 application Topical BID  . vitamin B-12  1,000 mcg Oral Daily  . warfarin  4 mg Oral ONCE-1800  . Warfarin - Pharmacist Dosing Inpatient   Does not apply q1800   Continuous Infusions: . sodium  chloride    . piperacillin-tazobactam (ZOSYN)  IV 3.375 g (12/11/17 1253)  . [START ON 12/12/2017] vancomycin     PRN Meds:.sodium chloride, acetaminophen **OR** acetaminophen, ALPRAZolam, haloperidol lactate, HYDROcodone-acetaminophen, hydrOXYzine, ondansetron, polyethylene glycol, prochlorperazine, promethazine, sodium chloride flush  Allergies  Allergen Reactions  . Sulfa Antibiotics Nausea And Vomiting    Physical Exam  Vitals  Blood pressure 112/61, pulse (!) 105, temperature 97.6 F (36.4 C), temperature source Oral, resp. rate 18, height 5\' 4"  (1.626 m), weight 63.5 kg (140 lb), SpO2 100 %.  Lower Extremity exam:  Vascular: Diminished but are palpable to the left foot.  Right foot palpable DP and PT arteries  Dermatological: Patient really has no large open ulcerations or sores on the foot.  There is a significantly large blister about 6 cm x 5 cm on the medial ankle peers to be superficial with a serous type fluid in it.  Does not appear to be purulent.  Otherwise no open sores to the area.  Significant erythema and dermatitis to the right foot and ankle area.  Neurological: Deferred at present  Ortho: No gross deformities  Data Review  CBC Recent Labs  Lab 12/08/17 0955 12/10/17 1234 12/11/17 0805  WBC 3.3* 2.6* 2.6*  HGB 11.5* 11.1* 11.3*  HCT 32.7* 32.4* 32.8*  PLT 243 195 181  MCV 101.3* 100.6* 101.8*  MCH 35.5* 34.5* 35.0*  MCHC 35.0 34.3 34.4  RDW 14.2 14.4 14.7*  LYMPHSABS 1.2 0.8*  --   MONOABS 0.2 0.1*  --   EOSABS 0.2 0.1  --   BASOSABS 0.0 0.0  --    ------------------------------------------------------------------------------------------------------------------  Chemistries  Recent Labs  Lab 12/08/17 0955 12/10/17 1234  NA 136 138  K 3.9 4.0  CL 109 111  CO2 19* 21*  GLUCOSE 135* 116*  BUN 16 16  CREATININE 0.98 0.73  CALCIUM 8.7* 8.5*  AST 17 17  ALT 11* 10*  ALKPHOS 53 49  BILITOT 0.4 1.1    ------------------------------------------------------------------------------------------------------------------ estimated creatinine clearance is 52.4 mL/min (by C-G formula based on SCr of 0.73 mg/dL). ------------------------------------------------------------------------------------------------------------------ No results for input(s): TSH, T4TOTAL, T3FREE, THYROIDAB in the last 72 hours.  Invalid input(s): FREET3 Urinalysis    Component Value Date/Time   COLORURINE YELLOW (A) 12/10/2017 1409   APPEARANCEUR CLEAR (A) 12/10/2017 1409   APPEARANCEUR Clear 03/15/2015 1517   LABSPEC 1.020 12/10/2017 1409   PHURINE 5.0 12/10/2017 1409   GLUCOSEU NEGATIVE 12/10/2017 1409   HGBUR SMALL (A) 12/10/2017 1409   BILIRUBINUR NEGATIVE 12/10/2017 1409   BILIRUBINUR Negative 03/15/2015 1517   KETONESUR NEGATIVE 12/10/2017 1409   PROTEINUR 30 (A) 12/10/2017 1409   NITRITE NEGATIVE 12/10/2017 1409   LEUKOCYTESUR NEGATIVE 12/10/2017 1409   LEUKOCYTESUR Negative 03/15/2015 1517     Imaging results:   Dg Chest 1 View  Result Date: 12/10/2017 CLINICAL DATA:  Dyspnea. EXAM: CHEST  1 VIEW COMPARISON:  CT scan of January 04, 2017. FINDINGS: The heart size and mediastinal contours are within normal limits. Atherosclerosis of thoracic aorta is noted. Right internal jugular Port-A-Cath is noted with distal tip in expected position of cavoatrial junction. No pneumothorax or pleural effusion is noted. Right lung is clear. Minimal left basilar subsegmental atelectasis is noted. The visualized skeletal structures are unremarkable. IMPRESSION: Minimal left basilar subsegmental atelectasis. Aortic Atherosclerosis (ICD10-I70.0). Electronically Signed   By: Marijo Conception, M.D.   On: 12/10/2017 13:39   US Venous Img Lower Unilateral Left  Result Date: 12/10/2017 CLINICAL DATA:  Left lower extremity pain and edema. History of pancreatic cancer. Evaluate for DVT. EXAM: LEFT LOWER EXTREMITY VENOUS DOPPLER  ULTRASOUND TECHNIQUE: Gray-scale sonography with graded compression, as well as color Doppler and duplex ultrasound were performed to evaluate the lower extremity deep venous systems from the level of the common femoral vein and including the common femoral, femoral, profunda femoral, popliteal and calf veins including the posterior tibial, peroneal and gastrocnemius veins when visible. The superficial great saphenous vein was also interrogated. Spectral Doppler was utilized to evaluate flow at rest and with distal augmentation maneuvers in the common femoral, femoral and popliteal veins. COMPARISON:  None. FINDINGS: Contralateral Common Femoral Vein: Respiratory phasicity is normal and symmetric with the symptomatic side. No evidence of thrombus. Normal compressibility. Common Femoral Vein: No evidence of thrombus. Normal compressibility, respiratory phasicity and response to augmentation. Saphenofemoral Junction: No evidence of thrombus. Normal compressibility and flow on color Doppler imaging. Profunda Femoral Vein: No evidence of thrombus. Normal compressibility and flow on color Doppler imaging. Femoral Vein: No evidence of thrombus. Normal compressibility, respiratory phasicity and response to augmentation. Popliteal Vein: There is hypoechoic expansile occlusive thrombus within the left popliteal vein (images 29 through 32). Calf Veins: No evidence of thrombus. Normal compressibility and flow on color Doppler imaging. Superficial Great Saphenous Vein: No evidence of thrombus. Normal compressibility. Other Findings:  None. IMPRESSION: Examination is positive for short segment occlusive DVT involving the left popliteal vein. Electronically Signed   By: Sandi Mariscal M.D.   On: 12/10/2017 16:22    Assessment & Plan: Patient is significantly more compromised with a white count of only about 2.6.  He was also found to have a DVT when he was in the emergency room.  Has no more swelling to the left leg and foot at  this timeframe.  This could be causing some of the dermatitis that he  is experiencing and some of the erythema to the foot and ankle.  Blister may have developed secondary to pressure and friction from some of the swelling.  It is not extensive swelling.  Redness is fairly pronounced in the region.  Plan: Today I will drain the blister and put a compression wrap around the area.  Did this after a Betadine prep to the area.  Used Kerlix and Kerlix wrap to put some pressure around the region try to prevent the blister from returning.  Patient has some triamcinolone cream ordered but hold off on that until he has been on some antibiotics for another day and see how well response from that perspective.  This is in case the area is cellulitic due to infection.  If he is stable but no better tomorrow then we can start the triamcinolone cream.  I will follow him again tomorrow to see how his progression is developing.  Active Problems:   Cellulitis   Family Communication: Plan discussed with patient and family  Albertine Patricia M.D on 12/11/2017 at 12:56 PM  Thank you for the consult, we will follow the patient with you in the Hospital.

## 2017-12-11 NOTE — Progress Notes (Addendum)
[place] for warfarin Indication: DVT  Allergies  Allergen Reactions  . Sulfa Antibiotics Nausea And Vomiting    Vital Signs: Temp: 97.6 F (36.4 C) (04/05 0552) Temp Source: Oral (04/05 0552) BP: 111/43 (04/05 0552) Pulse Rate: 97 (04/05 0552)  Labs: Recent Labs    12/08/17 0955 12/10/17 1234 12/11/17 0805  HGB 11.5* 11.1* 11.3*  HCT 32.7* 32.4* 32.8*  PLT 243 195 181  LABPROT  --   --  14.7  INR  --   --  1.16  CREATININE 0.98 0.73  --   TROPONINI  --  <0.03  --     Estimated Creatinine Clearance: 52.4 mL/min (by C-G formula based on SCr of 0.73 mg/dL).   Assessment: 84 year old male presents to ED with swelling to the left foot and left lower leg. Per MD note, patient is ultrasound positive for occlusive DVT. Patient has history of pancreatic cancer, diagnosed Jan 2019.   Bridging with therapeutic lovenox until warfarin therapeutic x2 and 5 days overlap.  Pt is eating well per RN.   4/4  No INR Warfarin 5 mg 4/5  INR 1.16   Goal of Therapy:  Monitor platelets by anticoagulation protocol: Yes   Plan:  Elderly pt with low-ish body weight now with pink-tinged urine per RN. RN called MD who stated to continue warfarin and enoxaparin. Will slightly decrease dose to warfarin 4 mg this evening (no baseline INR to assess trend) and check daily INR's.  Hgb/plt count stable from yesterday. Will need CBC at least every three days per policy.   Rayna Sexton, PharmD, BCPS Clinical Pharmacist 12/11/2017 9:13 AM

## 2017-12-11 NOTE — Progress Notes (Signed)
PT Cancellation Note  Patient Details Name: Gerald Alvarez. MRN: [id number] DOB: 1926-02-07   Cancelled Treatment:    Reason Eval/Treat Not Completed: Other (comment)(MD in room) Spoke with daughter who states that he has had a few bouts of diarrhea and is not feeling great right now.  Requests to hold PT and let him rest today and try back tomorrow as she expects him to be here for a few more days. Per nurse he did walk to the bathroom earlier today and did relatively well.   Kreg Shropshire, DPT 12/11/2017, 4:33 PM

## 2017-12-11 NOTE — Consult Note (Signed)
[facility] Nurse wound consult note Reason for Consult: LLE blistering + DVT noted  Wound type: cellulitis in the presence of a DVT Pressure Injury POA:NA  Dressing procedure/placement/frequency: After collaboration with bedside nurse, it appears that the podiatrist has seen this patient and provided beside treatment and wound care.   [place] nurse will sign off Podiatrist to direct wound care.  [place], [place], [place]

## 2017-12-11 NOTE — Consult Note (Addendum)
Pharmacy Antibiotic Note  Gerald Alvarez. is a 84 y.o. male admitted on 12/10/2017 with cellulitis.  Pharmacy has been consulted for Vancomycin dosing. Patient received Vancomycin 1g x 1 dose in ED. Zosyn added on 4/5.   Plan: Ke: 0.048  T1/2: 14.4   Vd: 44.8  Vancomycin 1000 IV every 24 hours started with 10 hour stack dosing.  Goal trough 10-15 mcg/mL. Calculated trough borderline low. Will increase to vancomycin 1250 mg IV q24h for calculated trough at Css of 13.3. Trough level before 4th dose.  Will monitor renal function and adjust dose as needed. SCr ordered for AM.   Zosyn 3.375 g IV q8h EI.   Height: 5\' 4"  (162.6 cm) Weight: 140 lb (63.5 kg) IBW/kg (Calculated) : 59.2  Temp (24hrs), Avg:97.5 F (36.4 C), Min:97.3 F (36.3 C), Max:97.6 F (36.4 C)  Recent Labs  Lab 12/08/17 0955 12/10/17 1234 12/10/17 1320 12/11/17 0805  WBC 3.3* 2.6*  --  2.6*  CREATININE 0.98 0.73  --   --   LATICACIDVEN  --   --  0.8  --     Estimated Creatinine Clearance: 52.4 mL/min (by C-G formula based on SCr of 0.73 mg/dL).    Allergies  Allergen Reactions  . Sulfa Antibiotics Nausea And Vomiting    Antimicrobials this admission: 0404 Vancomycin >> 4/5 Zosyn >>  Microbiology results: 0404 BCx: NGTD  Thank you for allowing pharmacy to be a part of this patient's care.  Rocky Morel, PharmD, BCPS Clinical Pharmacist 12/11/2017 9:18 AM

## 2017-12-11 NOTE — Plan of Care (Signed)
  Problem: Clinical Measurements: Goal: Ability to avoid or minimize complications of infection will improve Outcome: Progressing   Problem: Clinical Measurements: Goal: Ability to avoid or minimize complications of infection will improve Outcome: Progressing   Problem: Health Behavior/Discharge Planning: Goal: Ability to manage health-related needs will improve Outcome: Progressing   Problem: Clinical Measurements: Goal: Ability to maintain clinical measurements within normal limits will improve Outcome: Progressing   Problem: Activity: Goal: Risk for activity intolerance will decrease Outcome: Progressing   Problem: Coping: Goal: Level of anxiety will decrease Outcome: Progressing   Problem: Pain Managment: Goal: General experience of comfort will improve Outcome: Progressing   Problem: Safety: Goal: Ability to remain free from injury will improve Outcome: Progressing   Problem: Skin Integrity: Goal: Risk for impaired skin integrity will decrease Outcome: Progressing

## 2017-12-11 NOTE — Progress Notes (Signed)
Family Meeting Note  Advance Directive:yes  Today a meeting took place with the patient and family.   The following clinical team members were present during this meeting:MD  The following were discussed:Patient's diagnosis: Pancreatic cancer , Patient's progosis: Unable to determine and Goals for treatment: Full Code  Additional follow-up to be provided: patient would like to continue with FULL CODE No changes to advanced directives  Time spent during discussion:16 minutes  Gerald Cyran, MD

## 2017-12-12 DIAGNOSIS — D72819 Decreased white blood cell count, unspecified: Secondary | ICD-10-CM

## 2017-12-12 LAB — BASIC METABOLIC PANEL
Anion gap: 6 (ref 5–15)
BUN: 12 mg/dL (ref 6–20)
CO2: 21 mmol/L — ABNORMAL LOW (ref 22–32)
CREATININE: 0.81 mg/dL (ref 0.61–1.24)
Calcium: 8 mg/dL — ABNORMAL LOW (ref 8.9–10.3)
Chloride: 112 mmol/L — ABNORMAL HIGH (ref 101–111)
GFR calc Af Amer: >60 mL/min (ref >60)
Glucose, Bld: 151 mg/dL — ABNORMAL HIGH (ref 65–99)
Potassium: 3.9 mmol/L (ref 3.5–5.1)
Sodium: 139 mmol/L (ref 135–145)

## 2017-12-12 LAB — CBC
HCT: 30.5 % — ABNORMAL LOW (ref 40.0–52.0)
Hemoglobin: 10.5 g/dL — ABNORMAL LOW (ref 13.0–18.0)
MCH: 34.6 pg — AB (ref 26.0–34.0)
MCHC: 34.4 g/dL (ref 32.0–36.0)
MCV: 100.7 fL — ABNORMAL HIGH (ref 80.0–100.0)
PLATELETS: 179 10*3/uL (ref 150–440)
RBC: 3.02 MIL/uL — ABNORMAL LOW (ref 4.40–5.90)
RDW: 14.5 % (ref 11.5–14.5)
WBC: 2.3 10*3/uL — ABNORMAL LOW (ref 3.8–10.6)

## 2017-12-12 LAB — DIFFERENTIAL
Basophils Absolute: 0 10*3/uL (ref 0–0.1)
Basophils Relative: 0 %
EOS ABS: 0.1 10*3/uL (ref 0–0.7)
EOS PCT: 4 %
LYMPHS ABS: 0.8 10*3/uL — AB (ref 1.0–3.6)
Lymphocytes Relative: 36 %
MONOS PCT: 3 %
Monocytes Absolute: 0.1 10*3/uL — ABNORMAL LOW (ref 0.2–1.0)
Neutro Abs: 1.3 10*3/uL — ABNORMAL LOW (ref 1.4–6.5)
Neutrophils Relative %: 57 %

## 2017-12-12 LAB — GLUCOSE, CAPILLARY
GLUCOSE-CAPILLARY: 122 mg/dL — AB (ref 65–99)
Glucose-Capillary: 147 mg/dL — ABNORMAL HIGH (ref 65–99)
Glucose-Capillary: 140 mg/dL — ABNORMAL HIGH (ref 65–99)
Glucose-Capillary: 153 mg/dL — ABNORMAL HIGH (ref 65–99)

## 2017-12-12 MED ORDER — LACTULOSE 10 GM/15ML PO SOLN: 10.0000 g | Freq: Every day | ORAL | Status: DC | PRN

## 2017-12-12 NOTE — Progress Notes (Signed)
PT Cancellation Note  Patient Details Name: Gerald Alvarez. MRN: [id number] DOB: 05-20-1926   Cancelled Treatment:    Reason Eval/Treat Not Completed: Other (comment).  PT consult received.  Chart reviewed.  PT discussed pt's status with nurse; no restrictions to physical therapy noted with review of chart and orders.  Upon PT arrival to pt's room, pt's wife reporting that MD Troxler told her this morning that pt should not participate in PT today (pt also refusing PT today d/t not feeling up to it).  PT discussed PT consult with MD Troxler via phone and MD Troxler recommending holding PT today d/t L LE concerns (did not want pt walking on L LE).  Will monitor PT orders and MD Troxler note tomorrow for any restrictions to initiating physical therapy eval.  Leitha Bleak, PT 12/12/17, 3:05 PM [phone]

## 2017-12-12 NOTE — Progress Notes (Signed)
[place] [facility] [place] NAME: Gerald Alvarez    MR#:  [id number]  DATE OF BIRTH:  1926/02/09  SUBJECTIVE:   Patient upset that he is on fall risk Blister was drained yesterday by podiatry  REVIEW OF SYSTEMS:    Review of Systems  Constitutional: Negative for fever, chills weight loss HENT: Negative for ear pain, nosebleeds, congestion, facial swelling, rhinorrhea, neck pain, neck stiffness and ear discharge.   Respiratory: Negative for cough, shortness of breath, wheezing  Cardiovascular: Negative for chest pain, palpitations and leg swelling.  Gastrointestinal: Negative for heartburn, abdominal pain, vomiting, diarrhea or consitpation Genitourinary: Negative for dysuria, urgency, frequency, hematuria Musculoskeletal: Negative for back pain or joint pain Neurological: Negative for dizziness, seizures, syncope, focal weakness,  numbness and headaches.  Hematological: Does not bruise/bleed easily.  Psychiatric/Behavioral: Negative for hallucinations, confusion, dysphoric mood Erythema of lower extremity has improved   Tolerating Diet:yes      DRUG ALLERGIES:   Allergies  Allergen Reactions  . Sulfa Antibiotics Nausea And Vomiting    VITALS:  Blood pressure 116/72, pulse 82, temperature 98.4 F (36.9 C), resp. rate 16, height 5\' 4"  (1.626 m), weight 63.5 kg (140 lb), SpO2 99 %.  PHYSICAL EXAMINATION:  Constitutional: Appears well-developed and well-nourished. No distress. HENT: Normocephalic. Marland Kitchen Oropharynx is clear and moist.  Eyes: Conjunctivae and EOM are normal. PERRLA, no scleral icterus.  Neck: Normal ROM. Neck supple. No JVD. No tracheal deviation. CVS: RRR, S1/S2 +, no murmurs, no gallops, no carotid bruit.  Pulmonary: Effort and breath sounds normal, no stridor, rhonchi, wheezes, rales.  Abdominal: Soft. BS +,  no distension, tenderness, rebound or guarding.  Musculoskeletal: Normal range of motion. No edema and no  tenderness.  Neuro: Alert. CN 2-12 grossly intact. No focal deficits. Skin: Swelling and erythema has improved. Psychiatric: Normal mood and affect.      LABORATORY PANEL:   CBC Recent Labs  Lab 12/12/17 0514  WBC 2.3*  HGB 10.5*  HCT 30.5*  PLT 179   ------------------------------------------------------------------------------------------------------------------  Chemistries  Recent Labs  Lab 12/10/17 1234 12/12/17 0514  NA 138 139  K 4.0 3.9  CL 111 112*  CO2 21* 21*  GLUCOSE 116* 151*  BUN 16 12  CREATININE 0.73 0.81  CALCIUM 8.5* 8.0*  AST 17  --   ALT 10*  --   ALKPHOS 49  --   BILITOT 1.1  --    ------------------------------------------------------------------------------------------------------------------  Cardiac Enzymes Recent Labs  Lab 12/10/17 1234  TROPONINI <0.03   ------------------------------------------------------------------------------------------------------------------  RADIOLOGY:  Dg Chest 1 View  Result Date: 12/10/2017 CLINICAL DATA:  Dyspnea. EXAM: CHEST  1 VIEW COMPARISON:  CT scan of January 04, 2017. FINDINGS: The heart size and mediastinal contours are within normal limits. Atherosclerosis of thoracic aorta is noted. Right internal jugular Port-A-Cath is noted with distal tip in expected position of cavoatrial junction. No pneumothorax or pleural effusion is noted. Right lung is clear. Minimal left basilar subsegmental atelectasis is noted. The visualized skeletal structures are unremarkable. IMPRESSION: Minimal left basilar subsegmental atelectasis. Aortic Atherosclerosis (ICD10-I70.0). Electronically Signed   By: Marijo Conception, M.D.   On: 12/10/2017 13:39   US Venous Img Lower Unilateral Left  Result Date: 12/10/2017 CLINICAL DATA:  Left lower extremity pain and edema. History of pancreatic cancer. Evaluate for DVT. EXAM: LEFT LOWER EXTREMITY VENOUS DOPPLER ULTRASOUND TECHNIQUE: Gray-scale sonography with graded compression, as  well as color Doppler and duplex ultrasound were performed to evaluate the  lower extremity deep venous systems from the level of the common femoral vein and including the common femoral, femoral, profunda femoral, popliteal and calf veins including the posterior tibial, peroneal and gastrocnemius veins when visible. The superficial great saphenous vein was also interrogated. Spectral Doppler was utilized to evaluate flow at rest and with distal augmentation maneuvers in the common femoral, femoral and popliteal veins. COMPARISON:  None. FINDINGS: Contralateral Common Femoral Vein: Respiratory phasicity is normal and symmetric with the symptomatic side. No evidence of thrombus. Normal compressibility. Common Femoral Vein: No evidence of thrombus. Normal compressibility, respiratory phasicity and response to augmentation. Saphenofemoral Junction: No evidence of thrombus. Normal compressibility and flow on color Doppler imaging. Profunda Femoral Vein: No evidence of thrombus. Normal compressibility and flow on color Doppler imaging. Femoral Vein: No evidence of thrombus. Normal compressibility, respiratory phasicity and response to augmentation. Popliteal Vein: There is hypoechoic expansile occlusive thrombus within the left popliteal vein (images 29 through 32). Calf Veins: No evidence of thrombus. Normal compressibility and flow on color Doppler imaging. Superficial Great Saphenous Vein: No evidence of thrombus. Normal compressibility. Other Findings:  None. IMPRESSION: Examination is positive for short segment occlusive DVT involving the left popliteal vein. Electronically Signed   By: Sandi Mariscal M.D.   On: 12/10/2017 16:22     ASSESSMENT AND PLAN:   84 year old male with a history of pancreatic adenocarcinoma who presents with redness  and blistering of the left foot.   1 Acute left lower extremity cellulitis with blister that was drained by podiatry yesterday Continue IV vancomycin and Zosyn Follow-up  on final cultures Follow-up on podiatry reevaluation.   2 Acute left lower extremity DVT: Patient is on Eliquis.  Likely that patient will require this for at least 3 months.  3 pancreatic adenocarcinoma stage II  status post chemotherapy 6 rounds-Last one was on Tuesday College evaluation is appreciated.  Patient will follow up with Dr. Grayland Ormond upon discharge  4 Diabetes mellitus type 2 Continue  sliding scale insulin with Accu-Cheks   5.  GERD: Continue PPI  6.  Depression/anxiety: Continue Xanax and Cymbalta   7.  Decreased white blood cell from chemotherapy  Management plans discussed with the patient and family and they are in agreement.  CODE STATUS: FULL  TOTAL TIME TAKING CARE OF THIS PATIENT: 30 minutes.    Physical therapy consultation for discharge planning Case discussed with Dr. Tasia Catchings  POSSIBLE D/C tomorrow, DEPENDING ON CLINICAL CONDITION.   Jaedon Siler M.D on 12/12/2017 at 10:58 AM  Between 7am to 6pm - Pager - [phone] After 6pm go to [email or web address] - password [email or web address] [facility]  Office  [phone]  CC: Primary care physician; Rusty Aus, MD  Note: This dictation was prepared with Dragon dictation along with smaller phrase technology. Any transcriptional errors that result from this process are unintentional.

## 2017-12-12 NOTE — Progress Notes (Signed)
[facility] Podiat[facility]                                                      Patient Demographics  Gerald Alvarez, is a 84 y.o. male   MRN: [id number]   DOB - 11/10/1925  Admit Date - 12/10/2017    Outpatient Primary MD for the patient is Rusty Aus, MD  Consult requested in the Hospital by Bettey Costa, MD, On 12/12/2017   With History of -  Past Medical History:  Diagnosis Date  . BPH (benign prostatic hyperplasia)   . Diabetes mellitus without complication ([place])   . Incontinence   . Nocturia   . OAB (overactive bladder)   . Pancreatic mass   . Prostate cancer [facility])       Past Surgical History:  Procedure Laterality Date  . CATARACT EXTRACTION, BILATERAL Bilateral   . CHOLECYSTECTOMY    . ENDOSCOPIC RETROGRADE CHOLANGIOPANCREATOGRAPHY (ERCP) WITH PROPOFOL N/A 09/27/2017   Procedure: ENDOSCOPIC RETROGRADE CHOLANGIOPANCREATOGRAPHY (ERCP) WITH PROPOFOL;  Surgeon: Lucilla Lame, MD;  Location: [facility] ENDOSCOPY;  Service: Endoscopy;  Laterality: N/A;  . ERCP N/A 11/17/2017   Procedure: ENDOSCOPIC RETROGRADE CHOLANGIOPANCREATOGRAPHY (ERCP);  Surgeon: Lucilla Lame, MD;  Location: [facility] ENDOSCOPY;  Service: Endoscopy;  Laterality: N/A;  . HERNIA REPAIR     x 3  . PORTA CATH INSERTION N/A 10/22/2017   Procedure: PORTA CATH INSERTION;  Surgeon: Algernon Huxley, MD;  Location: [place] CV LAB;  Service: Cardiovascular;  Laterality: N/A;  . PROSTATECTOMY      in for   No chief complaint on file.    HPI  Gerald Alvarez  is a 84 y.o. male, hospitalized with cellulitis and redness to his left foot and ankle.  He also was diagnosed with a DVT in the ER couple nights ago.  Been on IV vancomycin and Zosyn    Social History Social History   Tobacco Use  . Smoking status: Former Smoker    Last attempt to quit: 03/15/1967    Years since quitting: 50.7  . Smokeless  tobacco: Former Systems developer    Types: Chew  . Tobacco comment: quit long time  Substance Use Topics  . Alcohol use: No    Alcohol/week: 0.0 oz    Family History Family History  Problem Relation Age of Onset  . Prostate cancer Brother   . Bladder Cancer Brother   . Pancreatic cancer Brother   . Prostate cancer Brother   . Lymphoma Mother   . Leukemia Father   . Breast cancer Sister   . Uterine cancer Sister   . Kidney cancer Neg Hx     Prior to Admission medications   Medication Sig Start Date End Date Taking? Authorizing Provider  ALPRAZolam (XANAX) 0.25 MG tablet Take 1 tablet (0.25 mg total) by mouth at bedtime as needed for anxiety. 12/01/17  Yes Lloyd Huger, MD  Cholecalciferol (VITAMIN D3) 2000 units capsule Take 2,000 Units by mouth daily.  03/05/17  Yes [provider]  doxycycline (VIBRAMYCIN) 100 MG capsule Take 1 capsule by mouth 2 (two) times daily. 12/09/17  Yes [provider]  DULoxetine (CYMBALTA) 20 MG capsule Take 1 capsule by mouth daily. 09/12/17  Yes [provider]  LEVEMIR FLEXTOUCH 100 UNIT/ML Pen Inject 15 Units into the skin daily at 10 pm. Patient  taking differently: Inject 5 Units into the skin daily at 10 pm.  09/28/17  Yes Wieting, Richard, MD  megestrol (MEGACE) 40 MG tablet Take 40 mg by mouth daily. 10/30/17  Yes [provider]  Multiple Vitamin (MULTI-VITAMINS) TABS Take 1 tablet by mouth daily.    Yes [provider]  pantoprazole (PROTONIX) 40 MG tablet TAKE 1 TABLET TWICE A DAY 02/08/15  Yes [provider]  sertraline (ZOLOFT) 50 MG tablet Take 1 tablet by mouth daily. 07/14/17  Yes [provider]  spironolactone (ALDACTONE) 25 MG tablet Take 25 mg by mouth daily.   Yes [provider]  traZODone (DESYREL) 50 MG tablet Take 50 mg by mouth at bedtime.  12/29/16  Yes [provider]  triamcinolone ointment (KENALOG) 0.5 % Apply 1 application topically 2 (two) times daily.  12/08/17  Yes Burns, Wandra Feinstein, NP  vitamin B-12 (CYANOCOBALAMIN) 1000 MCG tablet Take 1,000 mcg by mouth.    Yes [provider]  doxazosin (CARDURA) 2 MG tablet Take 2 mg by mouth daily.  12/31/16 12/31/17  [provider]  hydrOXYzine (VISTARIL) 25 MG capsule Take 12.5 mg by mouth 3 (three) times daily as needed for itching.    [provider]  lidocaine-prilocaine (EMLA) cream Apply to affected area once 10/16/17   Lloyd Huger, MD  ondansetron (ZOFRAN) 4 MG tablet Take 1 tablet (4 mg total) by mouth every 8 (eight) hours as needed for nausea or vomiting. 10/27/17   Lloyd Huger, MD  prochlorperazine (COMPAZINE) 10 MG tablet Take 1 tablet (10 mg total) by mouth every 6 (six) hours as needed (Nausea or vomiting). 10/16/17   Lloyd Huger, MD  protein supplement shake (PREMIER PROTEIN) LIQD Take 325 mLs (11 oz total) by mouth 2 (two) times daily between meals. 09/28/17   Loletha Grayer, MD    Anti-infectives (From admission, onward)   Start     Dose/Rate Route Frequency Ordered Stop   12/12/17 0400  vancomycin (VANCOCIN) 1,250 mg in sodium chloride 0.9 % 250 mL IVPB     1,250 mg 166.7 mL/hr over 90 Minutes Intravenous Every 24 hours 12/11/17 0923     12/11/17 1200  piperacillin-tazobactam (ZOSYN) IVPB 3.375 g     3.375 g 12.5 mL/hr over 240 Minutes Intravenous Every 8 hours 12/11/17 1141     12/11/17 0400  vancomycin (VANCOCIN) IVPB 1000 mg/200 mL premix  Status:  Discontinued     1,000 mg 200 mL/hr over 60 Minutes Intravenous Every 24 hours 12/10/17 1924 12/11/17 0923   12/10/17 1745  vancomycin (VANCOCIN) IVPB 1000 mg/200 mL premix  Status:  Discontinued     1,000 mg 200 mL/hr over 60 Minutes Intravenous  Once 12/10/17 1730 12/10/17 1730   12/10/17 1745  vancomycin (VANCOCIN) IVPB 1000 mg/200 mL premix  Status:  Discontinued     1,000 mg 200 mL/hr over 60 Minutes Intravenous  Once 12/10/17 1731 12/10/17 1746   12/10/17 1700  vancomycin (VANCOCIN)  IVPB 1000 mg/200 mL premix     1,000 mg 200 mL/hr over 60 Minutes Intravenous  Once 12/10/17 1650 12/10/17 1847      Scheduled Meds: . apixaban  10 mg Oral BID   Followed by  . [START ON 12/18/2017] apixaban  5 mg Oral BID  . cholecalciferol  2,000 Units Oral Daily  . DULoxetine  20 mg Oral Daily  . insulin aspart  0-5 Units Subcutaneous QHS  . insulin aspart  0-9 Units Subcutaneous TID WC  .  lactulose  10 g Oral BID  . multivitamin with minerals  1 tablet Oral Daily  . pantoprazole  40 mg Oral BID  . protein supplement shake  11 oz Oral BID BM  . QUEtiapine  25 mg Oral Once  . sertraline  50 mg Oral Daily  . sodium chloride flush  3 mL Intravenous Q12H  . spironolactone  25 mg Oral Daily  . traZODone  50 mg Oral QHS  . triamcinolone ointment  1 application Topical BID  . vitamin B-12  1,000 mcg Oral Daily   Continuous Infusions: . sodium chloride    . piperacillin-tazobactam (ZOSYN)  IV Stopped (12/12/17 1031)  . vancomycin Stopped (12/12/17 0501)   PRN Meds:.sodium chloride, acetaminophen **OR** acetaminophen, ALPRAZolam, haloperidol lactate, HYDROcodone-acetaminophen, hydrOXYzine, ondansetron, polyethylene glycol, prochlorperazine, promethazine, sodium chloride flush  Allergies  Allergen Reactions  . Sulfa Antibiotics Nausea And Vomiting    Physical Exam: Patient is fairly alert little bit of confusion.  He is with his family today.  Vitals  Blood pressure 116/72, pulse 82, temperature 98.4 F (36.9 C), resp. rate 16, height 5\' 4"  (1.626 m), weight 63.5 kg (140 lb), SpO2 99 %.  Lower Extremity exam: Examination of the foot today shows there is continued cellulitis and redness to the left foot and ankle.  Also a blister to the left medial ankle.  Redness still present without much improvement.  Blister is stable.  Roof still intact. No refill of flluid in blister.  No other areas of open wound are noted.  No skin breaks. Data Review  CBC Recent Labs  Lab  12/08/17 0955 12/10/17 1234 12/11/17 0805 12/12/17 0514  WBC 3.3* 2.6* 2.6* 2.3*  HGB 11.5* 11.1* 11.3* 10.5*  HCT 32.7* 32.4* 32.8* 30.5*  PLT 243 195 181 179  MCV 101.3* 100.6* 101.8* 100.7*  MCH 35.5* 34.5* 35.0* 34.6*  MCHC 35.0 34.3 34.4 34.4  RDW 14.2 14.4 14.7* 14.5  LYMPHSABS 1.2 0.8*  --   --   MONOABS 0.2 0.1*  --   --   EOSABS 0.2 0.1  --   --   BASOSABS 0.0 0.0  --   --    ------------------------------------------------------------------------------------------------------------------  Chemistries  Recent Labs  Lab 12/08/17 0955 12/10/17 1234 12/12/17 0514  NA 136 138 139  K 3.9 4.0 3.9  CL 109 111 112*  CO2 19* 21* 21*  GLUCOSE 135* 116* 151*  BUN 16 16 12   CREATININE 0.98 0.73 0.81  CALCIUM 8.7* 8.5* 8.0*  AST 17 17  --   ALT 11* 10*  --   ALKPHOS 53 49  --   BILITOT 0.4 1.1  --    -----------------------------------------------------------------------------------Urinalysis    Component Value Date/Time   COLORURINE YELLOW (A) 12/10/2017 1409   APPEARANCEUR CLEAR (A) 12/10/2017 1409   APPEARANCEUR Clear 03/15/2015 1517   LABSPEC 1.020 12/10/2017 1409   PHURINE 5.0 12/10/2017 1409   GLUCOSEU NEGATIVE 12/10/2017 1409   HGBUR SMALL (A) 12/10/2017 1409   BILIRUBINUR NEGATIVE 12/10/2017 1409   BILIRUBINUR Negative 03/15/2015 1517   KETONESUR NEGATIVE 12/10/2017 1409   PROTEINUR 30 (A) 12/10/2017 1409   NITRITE NEGATIVE 12/10/2017 1409   LEUKOCYTESUR NEGATIVE 12/10/2017 1409   LEUKOCYTESUR Negative 03/15/2015 1517     Imaging results:   Dg Chest 1 View  Result Date: 12/10/2017 CLINICAL DATA:  Dyspnea. EXAM: CHEST  1 VIEW COMPARISON:  CT scan of January 04, 2017. FINDINGS: The heart size and mediastinal contours are within normal limits. Atherosclerosis of thoracic aorta  is noted. Right internal jugular Port-A-Cath is noted with distal tip in expected position of cavoatrial junction. No pneumothorax or pleural effusion is noted. Right lung is clear.  Minimal left basilar subsegmental atelectasis is noted. The visualized skeletal structures are unremarkable. IMPRESSION: Minimal left basilar subsegmental atelectasis. Aortic Atherosclerosis (ICD10-I70.0). Electronically Signed   By: Marijo Conception, M.D.   On: 12/10/2017 13:39   US Venous Img Lower Unilateral Left  Result Date: 12/10/2017 CLINICAL DATA:  Left lower extremity pain and edema. History of pancreatic cancer. Evaluate for DVT. EXAM: LEFT LOWER EXTREMITY VENOUS DOPPLER ULTRASOUND TECHNIQUE: Gray-scale sonography with graded compression, as well as color Doppler and duplex ultrasound were performed to evaluate the lower extremity deep venous systems from the level of the common femoral vein and including the common femoral, femoral, profunda femoral, popliteal and calf veins including the posterior tibial, peroneal and gastrocnemius veins when visible. The superficial great saphenous vein was also interrogated. Spectral Doppler was utilized to evaluate flow at rest and with distal augmentation maneuvers in the common femoral, femoral and popliteal veins. COMPARISON:  None. FINDINGS: Contralateral Common Femoral Vein: Respiratory phasicity is normal and symmetric with the symptomatic side. No evidence of thrombus. Normal compressibility. Common Femoral Vein: No evidence of thrombus. Normal compressibility, respiratory phasicity and response to augmentation. Saphenofemoral Junction: No evidence of thrombus. Normal compressibility and flow on color Doppler imaging. Profunda Femoral Vein: No evidence of thrombus. Normal compressibility and flow on color Doppler imaging. Femoral Vein: No evidence of thrombus. Normal compressibility, respiratory phasicity and response to augmentation. Popliteal Vein: There is hypoechoic expansile occlusive thrombus within the left popliteal vein (images 29 through 32). Calf Veins: No evidence of thrombus. Normal compressibility and flow on color Doppler imaging. Superficial  Great Saphenous Vein: No evidence of thrombus. Normal compressibility. Other Findings:  None. IMPRESSION: Examination is positive for short segment occlusive DVT involving the left popliteal vein. Electronically Signed   By: Sandi Mariscal M.D.   On: 12/10/2017 16:22    Assessment & Plan: Patient appears to be stable his white count has come down although he is extremely leukopenic.  Secondary to his cancer.  Because the blisters appears to be stable going to go ahead and start on that triamcinolone cream to his foot but keep it away from the blister area.  We will continue to put bacitracin on the blister region.  Continue to wrap the foot as well and I will follow him tomorrow she thinks doing better form.  He is not complaining of any pain.  Active Problems:   Malignant neoplasm of pancreas (HCC)   Cellulitis   Acute deep vein thrombosis (DVT) of popliteal vein of left lower extremity ([place])   Family Communication: Plan discussed with patient and family  Albertine Patricia M.D on 12/12/2017 at 11:04 AM

## 2017-12-12 NOTE — Progress Notes (Addendum)
Hematology/Oncology Progress Note [facility] [phone] [phone]  Patient Care Team: Rusty Aus, MD as PCP - General (Internal Medicine) Clent Jacks, RN as Registered Nurse   Name of the patient: Gerald Alvarez  [id number]  1925-10-21  Date of visit: 12/12/17   INTERVAL HISTORY-  Patient feels better today. Started on Eliquis 5mg  BID. No bleeding. No fever or chills.  LLE blister was drained by podiatry yesterday. Wife daughter and multiple family friends at bedside.   Review of systems- Review of Systems  Constitutional: Negative for chills and fever.  HENT: Negative for ear discharge and nosebleeds.   Eyes: Negative for double vision and photophobia.  Respiratory: Negative for cough.   Cardiovascular: Negative for chest pain, palpitations and orthopnea.  Gastrointestinal: Negative for nausea and vomiting.  Genitourinary: Negative for dysuria.  Musculoskeletal: Negative for myalgias and neck pain.  Neurological: Negative for dizziness.  Endo/Heme/Allergies: Does not bruise/bleed easily.  Psychiatric/Behavioral: Negative for depression.    Allergies  Allergen Reactions  . Sulfa Antibiotics Nausea And Vomiting    Patient Active Problem List   Diagnosis Date Noted  . Acute deep vein thrombosis (DVT) of popliteal vein of left lower extremity ([place])   . Cellulitis 12/10/2017  . Malignant neoplasm of pancreas ([place]) 10/13/2017  . Malnutrition of moderate degree 09/28/2017  . Common bile duct obstruction   . Pancreatic mass   . Jaundice 09/25/2017  . Low serum vitamin D 03/05/2017  . Adult idiopathic generalized osteoporosis 01/19/2017  . Vertebral compression fracture, with routine healing, subsequent encounter 01/19/2017  . B12 deficiency 10/23/2016  . Medicare annual wellness visit, initial 09/04/2016  . DD (diverticular disease) 03/10/2016  . Calculus of kidney 03/10/2016  . Bilateral tinnitus 03/10/2016  .  Cardiomyopathy ([place]) 08/25/2014  . H/O malignant neoplasm of prostate 08/25/2014     Past Medical History:  Diagnosis Date  . BPH (benign prostatic hyperplasia)   . Diabetes mellitus without complication ([place])   . Incontinence   . Nocturia   . OAB (overactive bladder)   . Pancreatic mass   . Prostate cancer [facility])      Past Surgical History:  Procedure Laterality Date  . CATARACT EXTRACTION, BILATERAL Bilateral   . CHOLECYSTECTOMY    . ENDOSCOPIC RETROGRADE CHOLANGIOPANCREATOGRAPHY (ERCP) WITH PROPOFOL N/A 09/27/2017   Procedure: ENDOSCOPIC RETROGRADE CHOLANGIOPANCREATOGRAPHY (ERCP) WITH PROPOFOL;  Surgeon: Lucilla Lame, MD;  Location: [facility] ENDOSCOPY;  Service: Endoscopy;  Laterality: N/A;  . ERCP N/A 11/17/2017   Procedure: ENDOSCOPIC RETROGRADE CHOLANGIOPANCREATOGRAPHY (ERCP);  Surgeon: Lucilla Lame, MD;  Location: [facility] ENDOSCOPY;  Service: Endoscopy;  Laterality: N/A;  . HERNIA REPAIR     x 3  . PORTA CATH INSERTION N/A 10/22/2017   Procedure: PORTA CATH INSERTION;  Surgeon: Algernon Huxley, MD;  Location: [place] CV LAB;  Service: Cardiovascular;  Laterality: N/A;  . PROSTATECTOMY      Social History   Socioeconomic History  . Marital status: Married    Spouse name: Not on file  . Number of children: Not on file  . Years of education: Not on file  . Highest education level: Not on file  Occupational History  . Not on file  Social Needs  . Financial resource strain: Not on file  . Food insecurity:    Worry: Not on file    Inability: Not on file  . Transportation needs:    Medical: Not on file    Non-medical: Not on file  Tobacco Use  .  Smoking status: Former Smoker    Last attempt to quit: 03/15/1967    Years since quitting: 50.7  . Smokeless tobacco: Former Systems developer    Types: Chew  . Tobacco comment: quit long time  Substance and Sexual Activity  . Alcohol use: No    Alcohol/week: 0.0 oz  . Drug use: No  . Sexual activity: Never  Lifestyle  . Physical  activity:    Days per week: Not on file    Minutes per session: Not on file  . Stress: Not on file  Relationships  . Social connections:    Talks on phone: Not on file    Gets together: Not on file    Attends religious service: Not on file    Active member of club or organization: Not on file    Attends meetings of clubs or organizations: Not on file    Relationship status: Not on file  . Intimate partner violence:    Fear of current or ex partner: Not on file    Emotionally abused: Not on file    Physically abused: Not on file    Forced sexual activity: Not on file  Other Topics Concern  . Not on file  Social History Narrative  . Not on file     Family History  Problem Relation Age of Onset  . Prostate cancer Brother   . Bladder Cancer Brother   . Pancreatic cancer Brother   . Prostate cancer Brother   . Lymphoma Mother   . Leukemia Father   . Breast cancer Sister   . Uterine cancer Sister   . Kidney cancer Neg Hx      Current Facility-Administered Medications:  .  0.9 %  sodium chloride infusion, 250 mL, Intravenous, PRN, Salary, Montell D, MD .  acetaminophen (TYLENOL) tablet 650 mg, 650 mg, Oral, Q6H PRN **OR** acetaminophen (TYLENOL) suppository 650 mg, 650 mg, Rectal, Q6H PRN, Salary, Montell D, MD .  ALPRAZolam Duanne Moron) tablet 0.25 mg, 0.25 mg, Oral, QHS PRN, Salary, Montell D, MD, 0.25 mg at 12/11/17 1831 .  apixaban (ELIQUIS) tablet 10 mg, 10 mg, Oral, BID, 10 mg at 12/12/17 0551 **FOLLOWED BY** [START ON 12/18/2017] apixaban (ELIQUIS) tablet 5 mg, 5 mg, Oral, BID, Lifsey, Hannah C, RPH .  cholecalciferol (VITAMIN D) tablet 2,000 Units, 2,000 Units, Oral, Daily, Salary, Montell D, MD, 2,000 Units at 12/12/17 1008 .  DULoxetine (CYMBALTA) DR capsule 20 mg, 20 mg, Oral, Daily, Salary, Montell D, MD, 20 mg at 12/12/17 1008 .  haloperidol lactate (HALDOL) injection 1 mg, 1 mg, Intravenous, Q6H PRN, Lance Coon, MD .  HYDROcodone-acetaminophen (NORCO/VICODIN) 5-325 MG  per tablet 1-2 tablet, 1-2 tablet, Oral, Q4H PRN, Salary, Montell D, MD .  hydrOXYzine (ATARAX/VISTARIL) tablet 12.5 mg, 12.5 mg, Oral, TID PRN, Salary, Montell D, MD .  insulin aspart (novoLOG) injection 0-5 Units, 0-5 Units, Subcutaneous, QHS, Mody, Sital, MD .  insulin aspart (novoLOG) injection 0-9 Units, 0-9 Units, Subcutaneous, TID WC, Mody, Sital, MD, 1 Units at 12/12/17 1014 .  lactulose (CHRONULAC) 10 GM/15ML solution 10 g, 10 g, Oral, Daily PRN, Mody, Sital, MD .  multivitamin with minerals tablet 1 tablet, 1 tablet, Oral, Daily, Salary, Montell D, MD, 1 tablet at 12/12/17 1008 .  ondansetron (ZOFRAN) tablet 4 mg, 4 mg, Oral, Q8H PRN, Salary, Montell D, MD .  pantoprazole (PROTONIX) EC tablet 40 mg, 40 mg, Oral, BID, Salary, Montell D, MD, 40 mg at 12/12/17 1008 .  piperacillin-tazobactam (ZOSYN) IVPB 3.375  g, 3.375 g, Intravenous, Q8H, Rocky Morel, RPH, Stopped at 12/12/17 1031 .  polyethylene glycol (MIRALAX / GLYCOLAX) packet 17 g, 17 g, Oral, Daily PRN, Salary, Montell D, MD .  prochlorperazine (COMPAZINE) tablet 10 mg, 10 mg, Oral, Q6H PRN, Salary, Montell D, MD .  promethazine (PHENERGAN) tablet 12.5 mg, 12.5 mg, Oral, Q6H PRN, Salary, Montell D, MD .  protein supplement (PREMIER PROTEIN) liquid, 11 oz, Oral, BID BM, Salary, Montell D, MD, 11 oz at 12/11/17 1457 .  QUEtiapine (SEROQUEL) tablet 25 mg, 25 mg, Oral, Once, Lance Coon, MD .  sertraline (ZOLOFT) tablet 50 mg, 50 mg, Oral, Daily, Salary, Montell D, MD, 50 mg at 12/12/17 1008 .  sodium chloride flush (NS) 0.9 % injection 3 mL, 3 mL, Intravenous, Q12H, Salary, Montell D, MD, 3 mL at 12/12/17 1031 .  sodium chloride flush (NS) 0.9 % injection 3 mL, 3 mL, Intravenous, PRN, Salary, Montell D, MD .  spironolactone (ALDACTONE) tablet 25 mg, 25 mg, Oral, Daily, Salary, Montell D, MD, 25 mg at 12/12/17 1008 .  traZODone (DESYREL) tablet 50 mg, 50 mg, Oral, QHS, Salary, Montell D, MD, 50 mg at 12/11/17 2209 .  triamcinolone  ointment (KENALOG) 0.5 % 1 application, 1 application, Topical, BID, Salary, Montell D, MD, 1 application at 38/25/05 1030 .  vancomycin (VANCOCIN) 1,250 mg in sodium chloride 0.9 % 250 mL IVPB, 1,250 mg, Intravenous, Q24H, Bettey Costa, MD, Stopped at 12/12/17 0501 .  vitamin B-12 (CYANOCOBALAMIN) tablet 1,000 mcg, 1,000 mcg, Oral, Daily, Salary, Montell D, MD, 1,000 mcg at 12/12/17 1008   Physical exam:  Vitals:   12/11/17 0926 12/11/17 0944 12/11/17 2200 12/12/17 0630  BP: (!) 97/45 112/61 136/72 116/72  Pulse: (!) 115 (!) 105 86 82  Resp: 18   16  Temp:   98 F (36.7 C) 98.4 F (36.9 C)  TempSrc:   Oral   SpO2: 100%  97% 99%  Weight:      Height:       Constitutional: Appears well-developed and well-nourished. No distress. HENT: Normocephalic. Marland Kitchen Oropharynx is clear and moist.  Eyes: Conjunctivae and EOM are normal. PERRLA, no scleral icterus.  Neck: Normal ROM. Neck supple. No JVD. No tracheal deviation. CVS: RRR, S1/S2 +, no murmurs, no gallops, no carotid bruit.  Pulmonary: Effort and breath sounds normal, no stridor, rhonchi, wheezes, rales.  Abdominal: Soft. BS +,  no distension, tenderness, rebound or guarding.  Musculoskeletal: Normal range of motion. No edema and no tenderness.  Neuro: Alert. CN 2-12 grossly intact. No focal deficits. Skin: Right lower extremity Swelling and erythema has improved. Psychiatric: Normal mood and affect.       CMP Latest Ref Rng & Units 12/12/2017  Glucose 65 - 99 mg/dL 151(H)  BUN 6 - 20 mg/dL 12  Creatinine 0.61 - 1.24 mg/dL 0.81  Sodium 135 - 145 mmol/L 139  Potassium 3.5 - 5.1 mmol/L 3.9  Chloride 101 - 111 mmol/L 112(H)  CO2 22 - 32 mmol/L 21(L)  Calcium 8.9 - 10.3 mg/dL 8.0(L)  Total Protein 6.5 - 8.1 g/dL -  Total Bilirubin 0.3 - 1.2 mg/dL -  Alkaline Phos 38 - 126 U/L -  AST 15 - 41 U/L -  ALT 17 - 63 U/L -   CBC Latest Ref Rng & Units 12/12/2017  WBC 3.8 - 10.6 K/uL 2.3(L)  Hemoglobin 13.0 - 18.0 g/dL 10.5(L)    Hematocrit 40.0 - 52.0 % 30.5(L)  Platelets 150 - 440 K/uL 179    @  VFIEPP@  Dg Chest 1 View  Result Date: 12/10/2017 CLINICAL DATA:  Dyspnea. EXAM: CHEST  1 VIEW COMPARISON:  CT scan of January 04, 2017. FINDINGS: The heart size and mediastinal contours are within normal limits. Atherosclerosis of thoracic aorta is noted. Right internal jugular Port-A-Cath is noted with distal tip in expected position of cavoatrial junction. No pneumothorax or pleural effusion is noted. Right lung is clear. Minimal left basilar subsegmental atelectasis is noted. The visualized skeletal structures are unremarkable. IMPRESSION: Minimal left basilar subsegmental atelectasis. Aortic Atherosclerosis (ICD10-I70.0). Electronically Signed   By: Marijo Conception, M.D.   On: 12/10/2017 13:39   US Venous Img Lower Unilateral Left  Result Date: 12/10/2017 CLINICAL DATA:  Left lower extremity pain and edema. History of pancreatic cancer. Evaluate for DVT. EXAM: LEFT LOWER EXTREMITY VENOUS DOPPLER ULTRASOUND TECHNIQUE: Gray-scale sonography with graded compression, as well as color Doppler and duplex ultrasound were performed to evaluate the lower extremity deep venous systems from the level of the common femoral vein and including the common femoral, femoral, profunda femoral, popliteal and calf veins including the posterior tibial, peroneal and gastrocnemius veins when visible. The superficial great saphenous vein was also interrogated. Spectral Doppler was utilized to evaluate flow at rest and with distal augmentation maneuvers in the common femoral, femoral and popliteal veins. COMPARISON:  None. FINDINGS: Contralateral Common Femoral Vein: Respiratory phasicity is normal and symmetric with the symptomatic side. No evidence of thrombus. Normal compressibility. Common Femoral Vein: No evidence of thrombus. Normal compressibility, respiratory phasicity and response to augmentation. Saphenofemoral Junction: No evidence of thrombus.  Normal compressibility and flow on color Doppler imaging. Profunda Femoral Vein: No evidence of thrombus. Normal compressibility and flow on color Doppler imaging. Femoral Vein: No evidence of thrombus. Normal compressibility, respiratory phasicity and response to augmentation. Popliteal Vein: There is hypoechoic expansile occlusive thrombus within the left popliteal vein (images 29 through 32). Calf Veins: No evidence of thrombus. Normal compressibility and flow on color Doppler imaging. Superficial Great Saphenous Vein: No evidence of thrombus. Normal compressibility. Other Findings:  None. IMPRESSION: Examination is positive for short segment occlusive DVT involving the left popliteal vein. Electronically Signed   By: Sandi Mariscal M.D.   On: 12/10/2017 16:22   Dg C-arm 1-60 Min-no Report  Result Date: 11/17/2017 Fluoroscopy was utilized by the requesting physician.  No radiographic interpretation.    Assessment and plan-  Patient is a 84 y.o. male with stage IIb pancreatic adenocarcinoma, currently on single agent Abraxane currently admitted for left lower extremity cellulitis, and left lower extremity DVT.  # Lower extremity DVT in cancer: DVT is likely triggered by patient's hypercoagulable states secondary to cancer, and acute cellulitis.  Switched from Lovenox +coumadine to Eliquis yesterday. Tolerates well.  Fall risk. Follow up with Dr.Finnegan outpatient.   #Left lower extremity cellulitis managed per primary care. #Pancreatic cancer: No inpatient intervention.  Discussed with patient that he needs to follow-up with Dr. Grayland Ormond outpatient. # Leukopenia, WBC 2.3, likely secondary to recent chemotherapy. Will add differential to cbc. Monitor cbc w differential daily.   Thank you for allowing me to participate in the care of this patient.   Earlie Server, MD, PhD Hematology Oncology [facility] Pager- [phone] 12/12/2017

## 2017-12-12 NOTE — Progress Notes (Signed)
Dr. Elvina Mattes rounded on patient and performed foot care and clipped nails

## 2017-12-13 LAB — CBC
HCT: 30.3 % — ABNORMAL LOW (ref 40.0–52.0)
Hemoglobin: 10.2 g/dL — ABNORMAL LOW (ref 13.0–18.0)
MCH: 34.2 pg — ABNORMAL HIGH (ref 26.0–34.0)
MCHC: 33.6 g/dL (ref 32.0–36.0)
MCV: 101.8 fL — ABNORMAL HIGH (ref 80.0–100.0)
PLATELETS: 199 10*3/uL (ref 150–440)
RBC: 2.98 MIL/uL — AB (ref 4.40–5.90)
RDW: 14.6 % — AB (ref 11.5–14.5)
WBC: 1.9 10*3/uL — AB (ref 3.8–10.6)

## 2017-12-13 LAB — GLUCOSE, CAPILLARY
GLUCOSE-CAPILLARY: 124 mg/dL — AB (ref 65–99)
GLUCOSE-CAPILLARY: 144 mg/dL — AB (ref 65–99)
Glucose-Capillary: 175 mg/dL — ABNORMAL HIGH (ref 65–99)
Glucose-Capillary: 189 mg/dL — ABNORMAL HIGH (ref 65–99)

## 2017-12-13 LAB — CREATININE, SERUM: CREATININE: 0.74 mg/dL (ref 0.61–1.24)

## 2017-12-13 MED ORDER — BACITRACIN ZINC 500 UNIT/GM EX OINT
TOPICAL_OINTMENT | Freq: Two times a day (BID) | CUTANEOUS | Status: DC
  Administered 2017-12-13 – 2017-12-15 (×5): via TOPICAL
  Filled 2017-12-13 (×8): qty 14.2

## 2017-12-13 NOTE — Clinical Social Work Note (Signed)
Clinical Social Work Assessment  Patient Details  Name: Gerald Alvarez. MRN: [id number] Date of Birth: 13-Jun-1926  Date of referral:  12/13/17               Reason for consult:  Facility Placement                Permission sought to share information with:  Chartered certified accountant granted to share information::  Yes, Verbal Permission Granted  Name::        Agency::  [facility] area SNFs  Relationship::     Contact Information:     Housing/Transportation Living arrangements for the past 2 months:  [place] of Information:  Patient, Medical Team, Adult Children, Spouse, Siblings Patient Interpreter Needed:  None Criminal Activity/Legal Involvement Pertinent to Current Situation/Hospitalization:  No - Comment as needed Significant Relationships:  Adult Children, Church, [employer], Siblings, Other Family Members, Spouse Lives with:  Spouse Do you feel safe going back to the place where you live?  Yes Need for family participation in patient care:  Yes (Comment)(The patient has AMS)  Care giving concerns: PT recommendation for STR   Social Worker assessment / plan:  The CSW met with the patient and his family at bedside to discuss discharge planning. The patient was mildly confused about the situation and time. The patient's spouse and daughter both agreed with the PT recommendation for STR and indicated that [facility] would be the preference. The CSW advised the family of the need for prior authorization from [facility] and the barrier of active chemotherapy. The CSW will follow up tomorrow with bed offers. The patient will most likely discharge tomorrow or Tuesday.  Employment status:  Retired Nurse, adult PT Recommendations:  [place] / Referral to community resources:  [place]  Patient/Family's Response to care:  The patient was pleasantly confused. The family  thanked the CSW for assistance.  Patient/Family's Understanding of and Emotional Response to Diagnosis, Current Treatment, and Prognosis:  The family understands and agrees with the PT recommendation for STR.  Emotional Assessment Appearance:  Appears stated age Attitude/Demeanor/Rapport:  Engaged, Gracious Affect (typically observed):  Stable, Pleasant Orientation:  Oriented to Self, Oriented to Place Alcohol / Substance use:  Never Used Psych involvement (Current and /or in the community):  No (Comment)  Discharge Needs  Concerns to be addressed:  Care Coordination, Discharge Planning Concerns Readmission within the last 30 days:  Yes Current discharge risk:  Chronically ill Barriers to Discharge:  Continued Medical Work up   [employer], LCSW 12/13/2017, 2:41 PM

## 2017-12-13 NOTE — Telephone Encounter (Signed)
error 

## 2017-12-13 NOTE — Progress Notes (Signed)
[facility] Podiatry                                                      Patient Demographics  Gerald Alvarez, is a 84 y.o. male   MRN: [id number]   DOB - [birth year]  Admit Date - 12/10/2017    Outpatient Primary MD for the patient is Rusty Aus, MD  Consult requested in the Hospital by Bettey Costa, MD, On 12/13/2017    With History of -  Past Medical History:  Diagnosis Date  . BPH (benign prostatic hyperplasia)   . Diabetes mellitus without complication ([place])   . Incontinence   . Nocturia   . OAB (overactive bladder)   . Pancreatic mass   . Prostate cancer [facility])       Past Surgical History:  Procedure Laterality Date  . CATARACT EXTRACTION, BILATERAL Bilateral   . CHOLECYSTECTOMY    . ENDOSCOPIC RETROGRADE CHOLANGIOPANCREATOGRAPHY (ERCP) WITH PROPOFOL N/A 09/27/2017   Procedure: ENDOSCOPIC RETROGRADE CHOLANGIOPANCREATOGRAPHY (ERCP) WITH PROPOFOL;  Surgeon: Lucilla Lame, MD;  Location: [facility] ENDOSCOPY;  Service: Endoscopy;  Laterality: N/A;  . ERCP N/A 11/17/2017   Procedure: ENDOSCOPIC RETROGRADE CHOLANGIOPANCREATOGRAPHY (ERCP);  Surgeon: Lucilla Lame, MD;  Location: [facility] ENDOSCOPY;  Service: Endoscopy;  Laterality: N/A;  . HERNIA REPAIR     x 3  . PORTA CATH INSERTION N/A 10/22/2017   Procedure: PORTA CATH INSERTION;  Surgeon: Algernon Huxley, MD;  Location: [place] CV LAB;  Service: Cardiovascular;  Laterality: N/A;  . PROSTATECTOMY      in for   No chief complaint on file.    HPI  Gerald Alvarez  is a 84 y.o. male, hospitalized because of cellulitis to the left foot with a large blister on the left medial ankle.  I questioned him and his family today to find out if there is any chance that he had some type of a thermal burn to the region.  They denied that he would get a sunburn to the region or a burn from a heater for example.    Review  of Systems: He is alert well oriented and pleasant and communicative today.  He is not in any kind of distress or pain  In addition to the HPI above,  No Fever-chills, No Headache, No changes with Vision or hearing, No problems swallowing food or Liquids, No Chest pain, Cough or Shortness of Breath, No Abdominal pain, No Nausea or Vommitting, Bowel movements are regular, No Blood in stool or Urine, No dysuria, No new skin rashes or bruises, No new joints pains-aches,  No new weakness, tingling, numbness in any extremity, No recent weight gain or loss, No polyuria, polydypsia or polyphagia, No significant Mental Stressors.  A full 10 point Review of Systems was done, except as stated above, all other Review of Systems were negative.   Social History Social History   Tobacco Use  . Smoking status: Former Smoker    Last attempt to quit: 03/15/1967    Years since quitting: 50.7  . Smokeless tobacco: Former Systems developer    Types: Chew  . Tobacco comment: quit long time  Substance Use Topics  . Alcohol use: No    Alcohol/week: 0.0 oz    Family History Family History  Problem Relation Age of Onset  . Prostate cancer Brother   .  Bladder Cancer Brother   . Pancreatic cancer Brother   . Prostate cancer Brother   . Lymphoma Mother   . Leukemia Father   . Breast cancer Sister   . Uterine cancer Sister   . Kidney cancer Neg Hx     Prior to Admission medications   Medication Sig Start Date End Date Taking? Authorizing Provider  ALPRAZolam (XANAX) 0.25 MG tablet Take 1 tablet (0.25 mg total) by mouth at bedtime as needed for anxiety. 12/01/17  Yes Lloyd Huger, MD  Cholecalciferol (VITAMIN D3) 2000 units capsule Take 2,000 Units by mouth daily.  03/05/17  Yes [provider]  doxycycline (VIBRAMYCIN) 100 MG capsule Take 1 capsule by mouth 2 (two) times daily. 12/09/17  Yes [provider]  DULoxetine (CYMBALTA) 20 MG capsule Take 1 capsule by mouth daily. 09/12/17  Yes  [provider]  LEVEMIR FLEXTOUCH 100 UNIT/ML Pen Inject 15 Units into the skin daily at 10 pm. Patient taking differently: Inject 5 Units into the skin daily at 10 pm.  09/28/17  Yes Wieting, Richard, MD  megestrol (MEGACE) 40 MG tablet Take 40 mg by mouth daily. 10/30/17  Yes [provider]  Multiple Vitamin (MULTI-VITAMINS) TABS Take 1 tablet by mouth daily.    Yes [provider]  pantoprazole (PROTONIX) 40 MG tablet TAKE 1 TABLET TWICE A DAY 02/08/15  Yes [provider]  sertraline (ZOLOFT) 50 MG tablet Take 1 tablet by mouth daily. 07/14/17  Yes [provider]  spironolactone (ALDACTONE) 25 MG tablet Take 25 mg by mouth daily.   Yes [provider]  traZODone (DESYREL) 50 MG tablet Take 50 mg by mouth at bedtime.  12/29/16  Yes [provider]  triamcinolone ointment (KENALOG) 0.5 % Apply 1 application topically 2 (two) times daily. 12/08/17  Yes Burns, Wandra Feinstein, NP  vitamin B-12 (CYANOCOBALAMIN) 1000 MCG tablet Take 1,000 mcg by mouth.    Yes [provider]  doxazosin (CARDURA) 2 MG tablet Take 2 mg by mouth daily.  12/31/16 12/31/17  [provider]  hydrOXYzine (VISTARIL) 25 MG capsule Take 12.5 mg by mouth 3 (three) times daily as needed for itching.    [provider]  lidocaine-prilocaine (EMLA) cream Apply to affected area once 10/16/17   Lloyd Huger, MD  ondansetron (ZOFRAN) 4 MG tablet Take 1 tablet (4 mg total) by mouth every 8 (eight) hours as needed for nausea or vomiting. 10/27/17   Lloyd Huger, MD  prochlorperazine (COMPAZINE) 10 MG tablet Take 1 tablet (10 mg total) by mouth every 6 (six) hours as needed (Nausea or vomiting). 10/16/17   Lloyd Huger, MD  protein supplement shake (PREMIER PROTEIN) LIQD Take 325 mLs (11 oz total) by mouth 2 (two) times daily between meals. 09/28/17   Loletha Grayer, MD    Anti-infectives (From admission, onward)   Start     Dose/Rate Route  Frequency Ordered Stop   12/12/17 0400  vancomycin (VANCOCIN) 1,250 mg in sodium chloride 0.9 % 250 mL IVPB     1,250 mg 166.7 mL/hr over 90 Minutes Intravenous Every 24 hours 12/11/17 0923     12/11/17 1200  piperacillin-tazobactam (ZOSYN) IVPB 3.375 g     3.375 g 12.5 mL/hr over 240 Minutes Intravenous Every 8 hours 12/11/17 1141     12/11/17 0400  vancomycin (VANCOCIN) IVPB 1000 mg/200 mL premix  Status:  Discontinued     1,000 mg 200 mL/hr over 60 Minutes Intravenous Every 24 hours  12/10/17 1924 12/11/17 0923   12/10/17 1745  vancomycin (VANCOCIN) IVPB 1000 mg/200 mL premix  Status:  Discontinued     1,000 mg 200 mL/hr over 60 Minutes Intravenous  Once 12/10/17 1730 12/10/17 1730   12/10/17 1745  vancomycin (VANCOCIN) IVPB 1000 mg/200 mL premix  Status:  Discontinued     1,000 mg 200 mL/hr over 60 Minutes Intravenous  Once 12/10/17 1731 12/10/17 1746   12/10/17 1700  vancomycin (VANCOCIN) IVPB 1000 mg/200 mL premix     1,000 mg 200 mL/hr over 60 Minutes Intravenous  Once 12/10/17 1650 12/10/17 1847      Scheduled Meds: . apixaban  10 mg Oral BID   Followed by  . [START ON 12/18/2017] apixaban  5 mg Oral BID  . bacitracin   Topical BID  . cholecalciferol  2,000 Units Oral Daily  . DULoxetine  20 mg Oral Daily  . insulin aspart  0-5 Units Subcutaneous QHS  . insulin aspart  0-9 Units Subcutaneous TID WC  . multivitamin with minerals  1 tablet Oral Daily  . pantoprazole  40 mg Oral BID  . protein supplement shake  11 oz Oral BID BM  . QUEtiapine  25 mg Oral Once  . sertraline  50 mg Oral Daily  . sodium chloride flush  3 mL Intravenous Q12H  . spironolactone  25 mg Oral Daily  . traZODone  50 mg Oral QHS  . triamcinolone ointment  1 application Topical BID  . vitamin B-12  1,000 mcg Oral Daily   Continuous Infusions: . sodium chloride    . piperacillin-tazobactam (ZOSYN)  IV 3.375 g (12/13/17 0603)  . vancomycin Stopped (12/13/17 0702)   PRN Meds:.sodium chloride,  acetaminophen **OR** acetaminophen, ALPRAZolam, haloperidol lactate, HYDROcodone-acetaminophen, hydrOXYzine, lactulose, ondansetron, polyethylene glycol, prochlorperazine, promethazine, sodium chloride flush  Allergies  Allergen Reactions  . Sulfa Antibiotics Nausea And Vomiting    Physical Exam  Vitals  Blood pressure 131/64, pulse 82, temperature 98.5 F (36.9 C), temperature source Oral, resp. rate 18, height 5\' 4"  (1.626 m), weight 63.5 kg (140 lb), SpO2 98 %.  Lower Extremity exam: Posterior appears to be stable and drying.  The redness is still present but it is lessened to a certain extent and shows significant reduction of swelling and decreased warmth to the area today as compared to the last 2 days.  Data Review  CBC Recent Labs  Lab 12/08/17 0955 12/10/17 1234 12/11/17 0805 12/12/17 0514 12/13/17 0604  WBC 3.3* 2.6* 2.6* 2.3* 1.9*  HGB 11.5* 11.1* 11.3* 10.5* 10.2*  HCT 32.7* 32.4* 32.8* 30.5* 30.3*  PLT 243 195 181 179 199  MCV 101.3* 100.6* 101.8* 100.7* 101.8*  MCH 35.5* 34.5* 35.0* 34.6* 34.2*  MCHC 35.0 34.3 34.4 34.4 33.6  RDW 14.2 14.4 14.7* 14.5 14.6*  LYMPHSABS 1.2 0.8*  --  0.8*  --   MONOABS 0.2 0.1*  --  0.1*  --   EOSABS 0.2 0.1  --  0.1  --   BASOSABS 0.0 0.0  --  0.0  --    ------------------------------------------------------------------------------------------------------------------  Chemistries  Recent Labs  Lab 12/08/17 0955 12/10/17 1234 12/12/17 0514 12/13/17 0604  NA 136 138 139  --   K 3.9 4.0 3.9  --   CL 109 111 112*  --   CO2 19* 21* 21*  --   GLUCOSE 135* 116* 151*  --   BUN 16 16 12   --   CREATININE 0.98 0.73 0.81 0.74  CALCIUM 8.7* 8.5* 8.0*  --  AST 17 17  --   --   ALT 11* 10*  --   --   ALKPHOS 53 49  --   --   BILITOT 0.4 1.1  --   --    ----------------------------------------------------------------------------------- Urinalysis    Component Value Date/Time   COLORURINE YELLOW (A) 12/10/2017 1409    APPEARANCEUR CLEAR (A) 12/10/2017 1409   APPEARANCEUR Clear 03/15/2015 1517   LABSPEC 1.020 12/10/2017 1409   PHURINE 5.0 12/10/2017 1409   GLUCOSEU NEGATIVE 12/10/2017 1409   HGBUR SMALL (A) 12/10/2017 1409   BILIRUBINUR NEGATIVE 12/10/2017 1409   BILIRUBINUR Negative 03/15/2015 1517   KETONESUR NEGATIVE 12/10/2017 1409   PROTEINUR 30 (A) 12/10/2017 1409   NITRITE NEGATIVE 12/10/2017 1409   LEUKOCYTESUR NEGATIVE 12/10/2017 1409   LEUKOCYTESUR Negative 03/15/2015 1517     Imaging results:   No results found.  Assessment & Plan: He is allergic to sulfa some will hold off on any Silvadene for the blister.  I will start bacitracin ointment and twice a day dressing change that region.  Thanks okay for annual physical therapy as far as getting him up to go the bathroom into the transfer I would recommend a lot of walking at this point just to keep swelling minimize.  I will follow him again in the office in about a week to 10 days make sure he is doing well.  They are to call if any other problems develop.  Active Problems:   Malignant neoplasm of pancreas (HCC)   Cellulitis   Acute deep vein thrombosis (DVT) of popliteal vein of left lower extremity ([place])   Leucopenia   Family Communication: Plan discussed with patient and family  Albertine Patricia M.D on 12/13/2017 at 9:22 AM  Thank you for the consult, we will follow the patient with you in the Hospital.

## 2017-12-13 NOTE — Plan of Care (Signed)
  Problem: Clinical Measurements: Goal: Ability to avoid or minimize complications of infection will improve Outcome: Progressing   Problem: Skin Integrity: Goal: Skin integrity will improve Outcome: Progressing   Problem: Health Behavior/Discharge Planning: Goal: Ability to manage health-related needs will improve Outcome: Progressing   Problem: Clinical Measurements: Goal: Ability to maintain clinical measurements within normal limits will improve Outcome: Progressing Goal: Will remain free from infection Outcome: Progressing   Problem: Activity: Goal: Risk for activity intolerance will decrease Outcome: Progressing   Problem: Nutrition: Goal: Adequate nutrition will be maintained Outcome: Progressing   Problem: Pain Managment: Goal: General experience of comfort will improve Outcome: Progressing   Problem: Safety: Goal: Ability to remain free from injury will improve Outcome: Progressing   Problem: Skin Integrity: Goal: Risk for impaired skin integrity will decrease Outcome: Progressing

## 2017-12-13 NOTE — NC FL2 (Signed)
[place] LEVEL OF CARE SCREENING TOOL     IDENTIFICATION  Patient Name: Gerald Alvarez. Birthdate: 1925-11-20 Sex: male Admission Date (Current Location): 12/10/2017  [place] and [place] Number:  Engineering geologist and Address:  [facility], [address] [place], [place] [address]      Provider Number: [id number]  Attending Physician Name and Address:  Bettey Costa, MD  Relative Name and Phone Number:  Derryl Uher (Spouse) [phone] or Lyn Records (Daughter) [phone]    Current Level of Care: Hospital Recommended Level of Care: [place] Prior Approval Number:    Date Approved/Denied:   PASRR Number: [id number]  Discharge Plan: SNF    Current Diagnoses: Patient Active Problem List   Diagnosis Date Noted  . Leucopenia   . Acute deep vein thrombosis (DVT) of popliteal vein of left lower extremity ([place])   . Cellulitis 12/10/2017  . Malignant neoplasm of pancreas ([place]) 10/13/2017  . Malnutrition of moderate degree 09/28/2017  . Common bile duct obstruction   . Pancreatic mass   . Jaundice 09/25/2017  . Low serum vitamin D 03/05/2017  . Adult idiopathic generalized osteoporosis 01/19/2017  . Vertebral compression fracture, with routine healing, subsequent encounter 01/19/2017  . B12 deficiency 10/23/2016  . Medicare annual wellness visit, initial 09/04/2016  . DD (diverticular disease) 03/10/2016  . Calculus of kidney 03/10/2016  . Bilateral tinnitus 03/10/2016  . Cardiomyopathy ([place]) 08/25/2014  . H/O malignant neoplasm of prostate 08/25/2014    Orientation RESPIRATION BLADDER Height & Weight     Self, Place  Normal Continent Weight: 140 lb (63.5 kg) Height:  5\' 4"  (162.6 cm)  BEHAVIORAL SYMPTOMS/MOOD NEUROLOGICAL BOWEL NUTRITION STATUS      Continent    AMBULATORY STATUS COMMUNICATION OF NEEDS Skin   Extensive Assist Verbally Bruising                       Personal Care Assistance  Level of Assistance  Bathing, Feeding, Dressing Bathing Assistance: Limited assistance Feeding assistance: Independent Dressing Assistance: Limited assistance     Functional Limitations Info             SPECIAL CARE FACTORS FREQUENCY  PT (By licensed PT)     PT Frequency: Up to 5X per day, 5 days per week              Contractures Contractures Info: Not present    Additional Factors Info  Code Status, Allergies Code Status Info: Full Allergies Info: Sulfa Antibiotics           Current Medications (12/13/2017):  This is the current hospital active medication list Current Facility-Administered Medications  Medication Dose Route Frequency Provider Last Rate Last Dose  . 0.9 %  sodium chloride infusion  250 mL Intravenous PRN Salary, Montell D, MD      . acetaminophen (TYLENOL) tablet 650 mg  650 mg Oral Q6H PRN Salary, Montell D, MD       Or  . acetaminophen (TYLENOL) suppository 650 mg  650 mg Rectal Q6H PRN Salary, Montell D, MD      . ALPRAZolam Duanne Moron) tablet 0.25 mg  0.25 mg Oral QHS PRN Salary, Montell D, MD   0.25 mg at 12/11/17 1831  . apixaban (ELIQUIS) tablet 10 mg  10 mg Oral BID Lifsey, Betti Cruz, RPH   10 mg at 12/13/17 0551   Followed by  . [START ON 12/18/2017] apixaban (ELIQUIS) tablet 5 mg  5 mg Oral BID Lifsey, Betti Cruz, RPH      . bacitracin ointment   Topical BID Troxler, Rodman Key, DPM      . cholecalciferol (VITAMIN D) tablet 2,000 Units  2,000 Units Oral Daily Salary, Avel Peace, MD   2,000 Units at 12/13/17 0857  . DULoxetine (CYMBALTA) DR capsule 20 mg  20 mg Oral Daily Salary, Montell D, MD   20 mg at 12/13/17 0858  . haloperidol lactate (HALDOL) injection 1 mg  1 mg Intravenous Q6H PRN Lance Coon, MD      . HYDROcodone-acetaminophen (NORCO/VICODIN) 5-325 MG per tablet 1-2 tablet  1-2 tablet Oral Q4H PRN Salary, Montell D, MD      . hydrOXYzine (ATARAX/VISTARIL) tablet 12.5 mg  12.5 mg Oral TID PRN Salary, Montell D, MD      . insulin aspart  (novoLOG) injection 0-5 Units  0-5 Units Subcutaneous QHS Mody, Sital, MD      . insulin aspart (novoLOG) injection 0-9 Units  0-9 Units Subcutaneous TID WC Bettey Costa, MD   2 Units at 12/13/17 1232  . lactulose (CHRONULAC) 10 GM/15ML solution 10 g  10 g Oral Daily PRN Bettey Costa, MD      . multivitamin with minerals tablet 1 tablet  1 tablet Oral Daily Salary, Montell D, MD   1 tablet at 12/13/17 0857  . ondansetron (ZOFRAN) tablet 4 mg  4 mg Oral Q8H PRN Salary, Montell D, MD      . pantoprazole (PROTONIX) EC tablet 40 mg  40 mg Oral BID Loney Hering D, MD   40 mg at 12/13/17 0857  . piperacillin-tazobactam (ZOSYN) IVPB 3.375 g  3.375 g Intravenous Q8H Rocky Morel, RPH   Stopped at 12/13/17 1011  . polyethylene glycol (MIRALAX / GLYCOLAX) packet 17 g  17 g Oral Daily PRN Salary, Montell D, MD      . prochlorperazine (COMPAZINE) tablet 10 mg  10 mg Oral Q6H PRN Salary, Montell D, MD      . promethazine (PHENERGAN) tablet 12.5 mg  12.5 mg Oral Q6H PRN Salary, Montell D, MD      . protein supplement (PREMIER PROTEIN) liquid  11 oz Oral BID BM Salary, Montell D, MD   11 oz at 12/13/17 1354  . QUEtiapine (SEROQUEL) tablet 25 mg  25 mg Oral Once Lance Coon, MD      . sertraline (ZOLOFT) tablet 50 mg  50 mg Oral Daily Salary, Montell D, MD   50 mg at 12/13/17 0858  . sodium chloride flush (NS) 0.9 % injection 3 mL  3 mL Intravenous Q12H Salary, Montell D, MD   3 mL at 12/12/17 2103  . sodium chloride flush (NS) 0.9 % injection 3 mL  3 mL Intravenous PRN Salary, Montell D, MD      . spironolactone (ALDACTONE) tablet 25 mg  25 mg Oral Daily Salary, Montell D, MD   25 mg at 12/13/17 0858  . traZODone (DESYREL) tablet 50 mg  50 mg Oral QHS Salary, Holly Bodily D, MD   50 mg at 12/12/17 2101  . triamcinolone ointment (KENALOG) 0.5 % 1 application  1 application Topical BID Salary, Avel Peace, MD   1 application at 62/94/76 1103  . vancomycin (VANCOCIN) 1,250 mg in sodium chloride 0.9 % 250 mL IVPB   1,250 mg Intravenous Q24H Bettey Costa, MD   Stopped at 12/13/17 5465  . vitamin B-12 (CYANOCOBALAMIN) tablet 1,000 mcg  1,000 mcg Oral Daily Salary, Avel Peace, MD  1,000 mcg at 12/13/17 2080     Discharge Medications: Please see discharge summary for a list of discharge medications.  Relevant Imaging Results:  Relevant Lab Results:   Additional Information SS# [id number]  Zettie Pho, LCSW

## 2017-12-13 NOTE — Progress Notes (Signed)
[facility] [facility] [place] NAME: Gerald Alvarez    MR#:  [id number]  DATE OF BIRTH:  Aug 29, 1926  SUBJECTIVE:   Patient without any issues overnight  Dr. Elvina Mattes did not want the patient to work with physical therapy yesterday but today he may be able to.  REVIEW OF SYSTEMS:    Review of Systems  Constitutional: Negative for fever, chills weight loss HENT: Negative for ear pain, nosebleeds, congestion, facial swelling, rhinorrhea, neck pain, neck stiffness and ear discharge.   Respiratory: Negative for cough, shortness of breath, wheezing  Cardiovascular: Negative for chest pain, palpitations and leg swelling.  Gastrointestinal: Negative for heartburn, abdominal pain, vomiting, diarrhea or consitpation Genitourinary: Negative for dysuria, urgency, frequency, hematuria Musculoskeletal: Negative for back pain or joint pain Neurological: Negative for dizziness, seizures, syncope, focal weakness,  numbness and headaches.  Hematological: Does not bruise/bleed easily.  Psychiatric/Behavioral: Negative for hallucinations, confusion, dysphoric mood Erythema and edema of lower extremity has improved   Tolerating Diet:yes      DRUG ALLERGIES:   Allergies  Allergen Reactions  . Sulfa Antibiotics Nausea And Vomiting    VITALS:  Blood pressure 131/64, pulse 82, temperature 98.5 F (36.9 C), temperature source Oral, resp. rate 18, height 5\' 4"  (1.626 m), weight 63.5 kg (140 lb), SpO2 98 %.  PHYSICAL EXAMINATION:  Constitutional: Appears well-developed and well-nourished. No distress. HENT: Normocephalic. Marland Kitchen Oropharynx is clear and moist.  Eyes: Conjunctivae and EOM are normal. PERRLA, no scleral icterus.  Neck: Normal ROM. Neck supple. No JVD. No tracheal deviation. CVS: RRR, S1/S2 +, no murmurs, no gallops, no carotid bruit.  Pulmonary: Effort and breath sounds normal, no stridor, rhonchi, wheezes, rales.  Abdominal: Soft. BS +,  no distension,  tenderness, rebound or guarding.  Musculoskeletal: Normal range of motion. No edema and no tenderness.  Neuro: Alert. CN 2-12 grossly intact. No focal deficits. Skin: Swelling and erythema has improved. Psychiatric: Normal mood and affect.      LABORATORY PANEL:   CBC Recent Labs  Lab 12/13/17 0604  WBC 1.9*  HGB 10.2*  HCT 30.3*  PLT 199   ------------------------------------------------------------------------------------------------------------------  Chemistries  Recent Labs  Lab 12/10/17 1234 12/12/17 0514 12/13/17 0604  NA 138 139  --   K 4.0 3.9  --   CL 111 112*  --   CO2 21* 21*  --   GLUCOSE 116* 151*  --   BUN 16 12  --   CREATININE 0.73 0.81 0.74  CALCIUM 8.5* 8.0*  --   AST 17  --   --   ALT 10*  --   --   ALKPHOS 49  --   --   BILITOT 1.1  --   --    ------------------------------------------------------------------------------------------------------------------  Cardiac Enzymes Recent Labs  Lab 12/10/17 1234  TROPONINI <0.03   ------------------------------------------------------------------------------------------------------------------  RADIOLOGY:  No results found.   ASSESSMENT AND PLAN:   84 year old male with a history of pancreatic adenocarcinoma who presents with redness  and blistering of the left foot.   1 Acute left lower extremity cellulitis with blister that was drained by podiatry yesterday Continue IV vancomycin and Zosyn with plans to change oral Augmentin tomorrow Cultures so far are negative  Continue bacitracin ointment and twice a day dressing change  2 Acute left lower extremity DVT: Patient is on Eliquis.  Likely that patient will require this for at least 3 months.  3 pancreatic adenocarcinoma stage II   Oncology evaluation is appreciated.  Patient will follow up with Dr. Grayland Ormond upon discharge  4 Diabetes mellitus type 2 Continue sliding scale insulin with Accu-Cheks   5.  GERD: Continue PPI  6.   Depression/anxiety: Continue Xanax and Cymbalta   7.  Decreased white blood cell from chemotherapy CBC for a.m. with differential  Management plans discussed with the patient and family and they are in agreement.  CODE STATUS: FULL  TOTAL TIME TAKING CARE OF THIS PATIENT: 24 minutes.    As per Dr. Elvina Mattes okay for patient to work with physical therapy for discharge planning.  Case discussed with Dr. Elvina Mattes. POSSIBLE D/C tomorrow, DEPENDING ON CLINICAL CONDITION.   Tracker Mance M.D on 12/13/2017 at 9:59 AM  Between 7am to 6pm - Pager - [phone] After 6pm go to [email or web address] - password [email or web address] [facility]  Office  [phone]  CC: Primary care physician; Rusty Aus, MD  Note: This dictation was prepared with Dragon dictation along with smaller phrase technology. Any transcriptional errors that result from this process are unintentional.

## 2017-12-13 NOTE — Evaluation (Signed)
Physical Therapy Evaluation Patient Details Name: Gerald Alvarez. MRN: [id number] DOB: 07/29/1926 Today's Date: 12/13/2017   History of Present Illness  Pt is an 84 y.o. male presenting to hospital 12/10/17 with blistering and swelling to L foot and L lower leg (6cm x 5cm large blister medial ankle--dermatitis and erythema of foot and ankle).  Pt admitted with acute L LE DVT, acute L LE cellulitis with bulla formation compounded by immunosuppression d/t stage 2 pancreatic adenocarcinoma.  Blister drained 12/11/17.  Pt also noted to be leukopenic.  PMH includes 6th anterior R rib fx, pancreatic CA currently on chemo, DM, cardiomyopathy, BPH, overactive bladder, prostate CA, pancreatic mass, vertebral compression fx, prostatectomy, systolic CHF.  Clinical Impression  Prior to hospital admission, pt was modified independent ambulating with SPC.  Pt lives with his wife in 1 level home with 2 steps to enter with R railing.  Currently pt requires assist (varying assist levels; see transfers section for details)  to stand (pt with significant posterior lean requiring assist to shift weight forward and use of RW) and CGA to min assist to walk 30 feet with RW (pt unsteady with altered stepping pattern requiring assist for balance and safety; see gait section for further details).  Pt reporting no pain in L LE with or after ambulation but pt appeared to be favoring it some during ambulation.  Pt would benefit from skilled PT to address noted impairments and functional limitations (see below for any additional details).  Upon hospital discharge, recommend pt discharge to [facility].    Follow Up Recommendations SNF    Equipment Recommendations  Rolling walker with 5" wheels    Recommendations for Other Services       Precautions / Restrictions Precautions Precautions: Fall Precaution Comments: Limit walking to minimize L LE swelling (ok bathroom distance); R chest port; blister L ankle Restrictions Weight Bearing  Restrictions: No      Mobility  Bed Mobility Overal bed mobility: Needs Assistance Bed Mobility: Supine to Sit;Sit to Supine     Supine to sit: Supervision;HOB elevated Sit to supine: Supervision;HOB elevated   General bed mobility comments: mild increased effort to perform on own; bed requiring repositioning (to flat bed at angle) in order for pt to scoot up on own end of session with use of B railings  Transfers Overall transfer level: Needs assistance Equipment used: Rolling walker (2 wheeled);None Transfers: Sit to/from Stand Sit to Stand: Min assist;Mod assist;Max assist         General transfer comment: pt initially mod to max assist to stand from bed (no AD) d/t signficant posterior lean requiring assist to shift weight forward; pt min assist to stand with RW but still requiring assist to shift weight forward d/t posterior lean  Ambulation/Gait Ambulation/Gait assistance: Min guard;Min assist Ambulation Distance (Feet): 30 Feet Assistive device: Rolling walker (2 wheeled)   Gait velocity: decreased   General Gait Details: decreased stance time L LE; mild antalgic looking gait; increased lateral sway to R requiring intermittent min assist to steady and prevent loss of balance to R; pt initially with almost step to gait pattern with R LE externally rotated and stepping forward to L LE with very narrow BOS; with cueing pt with increased BOS and improved to step to to partial step through gait pattern (inconsistent with pattern though)  Stairs            Wheelchair Mobility    Modified Rankin (Stroke Patients Only)  Balance Overall balance assessment: Needs assistance Sitting-balance support: No upper extremity supported;Feet supported Sitting balance-Leahy Scale: Good Sitting balance - Comments: steady sitting reaching within BOS   Standing balance support: Bilateral upper extremity supported Standing balance-Leahy Scale: Poor Standing balance  comment: requires B UE support for standing balance with RW (d/t posterior lean otherwise)                             Pertinent Vitals/Pain Pain Assessment: Faces Faces Pain Scale: Hurts a little bit Pain Location: L ankle/foot Pain Descriptors / Indicators: Guarding Pain Intervention(s): Limited activity within patient's tolerance;Monitored during session;Repositioned  Vitals (HR and O2 on room air) stable and WFL throughout treatment session.    Home Living Family/patient expects to be discharged to:: Private residence Living Arrangements: Spouse/significant other Available Help at Discharge: Family Type of Home: House Home Access: Stairs to enter Entrance Stairs-Rails: Right Entrance Stairs-Number of Steps: 2 (from garage) Home Layout: One level Home Equipment: Shower seat - built in;Grab bars - tub/shower      Prior Function Level of Independence: Independent with assistive device(s)   Gait / Transfers Assistance Needed: Ambulates with SPC; h/o 1-2 falls in past 6 months (abrasions noted as injuries)           Hand Dominance        Extremity/Trunk Assessment   Upper Extremity Assessment Upper Extremity Assessment: (B shoulder flexion and elbow flexion/extension at least 3/5; good B hand grip)    Lower Extremity Assessment Lower Extremity Assessment: (B hip flexion and knee flexion/extension at least 3/5; DF B at least 3/5 (L ankle appearing stiff with movement))    Cervical / Trunk Assessment Cervical / Trunk Assessment: Normal  Communication   Communication: HOH  Cognition Arousal/Alertness: Awake/alert Behavior During Therapy: WFL for tasks assessed/performed Overall Cognitive Status: (Oriented to person and place and situation)                                        General Comments General comments (skin integrity, edema, etc.): Pt resting in bed upon PT arrival; pt's daughter and son-in-law present during session; more  family arrived during session.  Nursing cleared pt for participation in physical therapy.  Pt agreeable to PT session.    Exercises     Assessment/Plan    PT Assessment Patient needs continued PT services  PT Problem List Decreased strength;Decreased activity tolerance;Decreased range of motion;Decreased balance;Decreased mobility;Decreased knowledge of use of DME;Decreased knowledge of precautions;Pain       PT Treatment Interventions DME instruction;Gait training;Stair training;Functional mobility training;Therapeutic activities;Therapeutic exercise;Balance training;Patient/family education    PT Goals (Current goals can be found in the Care Plan section)  Acute Rehab PT Goals Patient Stated Goal: to improve mobility PT Goal Formulation: With patient/family Time For Goal Achievement: 12/27/17 Potential to Achieve Goals: Good    Frequency Min 2X/week   Barriers to discharge Decreased caregiver support      Co-evaluation               AM-PAC PT "6 Clicks" Daily Activity  Outcome Measure Difficulty turning over in bed (including adjusting bedclothes, sheets and blankets)?: A Little Difficulty moving from lying on back to sitting on the side of the bed? : A Lot Difficulty sitting down on and standing up from a chair with arms (e.g., wheelchair, bedside commode, etc,.)?:  Unable Help needed moving to and from a bed to chair (including a wheelchair)?: A Lot Help needed walking in hospital room?: A Little Help needed climbing 3-5 steps with a railing? : A Lot 6 Click Score: 13    End of Session Equipment Utilized During Treatment: Gait belt Activity Tolerance: Patient tolerated treatment well Patient left: in bed;with call bell/phone within reach(L LE elevated; pt sitting up to eat lunch; family present and reporting nursing cleared them not to have bed alarm on while they are present) Nurse Communication: Mobility status;Precautions;Other (comment)(to re-adjust bed after  pt is done eating) PT Visit Diagnosis: Unsteadiness on feet (R26.81);Other abnormalities of gait and mobility (R26.89);Muscle weakness (generalized) (M62.81);History of falling (Z91.81);Pain Pain - Right/Left: Left Pain - part of body: Ankle and joints of foot    Time: 6381-7711 PT Time Calculation (min) (ACUTE ONLY): 47 min   Charges:   PT Evaluation $PT Eval Low Complexity: 1 Low PT Treatments $Therapeutic Activity: 8-22 mins   PT G CodesLeitha Bleak, PT 12/13/17, 1:33 PM [phone]

## 2017-12-14 LAB — GLUCOSE, CAPILLARY
Glucose-Capillary: 149 mg/dL — ABNORMAL HIGH (ref 65–99)
Glucose-Capillary: 162 mg/dL — ABNORMAL HIGH (ref 65–99)
Glucose-Capillary: 155 mg/dL — ABNORMAL HIGH (ref 65–99)
Glucose-Capillary: 111 mg/dL — ABNORMAL HIGH (ref 65–99)

## 2017-12-14 LAB — CBC WITH DIFFERENTIAL/PLATELET
Basophils Absolute: 0 10*3/uL (ref 0–0.1)
Basophils Relative: 2 %
EOS PCT: 7 %
Eosinophils Absolute: 0.2 10*3/uL (ref 0–0.7)
HEMATOCRIT: 30.6 % — AB (ref 40.0–52.0)
Hemoglobin: 10.1 g/dL — ABNORMAL LOW (ref 13.0–18.0)
LYMPHS ABS: 0.9 10*3/uL — AB (ref 1.0–3.6)
LYMPHS PCT: 40 %
MCH: 33.8 pg (ref 26.0–34.0)
MCHC: 32.9 g/dL (ref 32.0–36.0)
MCV: 102.6 fL — AB (ref 80.0–100.0)
Monocytes Absolute: 0.1 10*3/uL — ABNORMAL LOW (ref 0.2–1.0)
Monocytes Relative: 6 %
Neutro Abs: 1 10*3/uL — ABNORMAL LOW (ref 1.4–6.5)
Neutrophils Relative %: 45 %
PLATELETS: 228 10*3/uL (ref 150–440)
RBC: 2.98 MIL/uL — AB (ref 4.40–5.90)
RDW: 14.5 % (ref 11.5–14.5)
WBC: 2.2 10*3/uL — AB (ref 3.8–10.6)

## 2017-12-14 LAB — AEROBIC CULTURE W GRAM STAIN (SUPERFICIAL SPECIMEN): Culture: NO GROWTH

## 2017-12-14 LAB — CREATININE, SERUM
Creatinine, Ser: 0.68 mg/dL (ref 0.61–1.24)
GFR calc Af Amer: >60 mL/min (ref >60)
GFR calc non Af Amer: >60 mL/min (ref >60)

## 2017-12-14 LAB — VANCOMYCIN, TROUGH: VANCOMYCIN TR: 8 ug/mL — AB (ref 15–20)

## 2017-12-14 MED ORDER — HYDROCODONE-ACETAMINOPHEN 5-325 MG PO TABS: 1.0000 | ORAL_TABLET | Freq: Four times a day (QID) | ORAL | 0 refills | Status: DC | PRN

## 2017-12-14 MED ORDER — ACETAMINOPHEN 325 MG PO TABS: 650.0000 mg | ORAL_TABLET | Freq: Four times a day (QID) | ORAL | 0 refills | Status: AC | PRN

## 2017-12-14 MED ORDER — POLYETHYLENE GLYCOL 3350 17 G PO PACK: 17.0000 g | PACK | Freq: Every day | ORAL | 0 refills | Status: AC | PRN

## 2017-12-14 MED ORDER — LEVEMIR FLEXTOUCH 100 UNIT/ML ~~LOC~~ SOPN: 5.0000 [IU] | PEN_INJECTOR | Freq: Two times a day (BID) | SUBCUTANEOUS | 11 refills | Status: DC

## 2017-12-14 MED ORDER — SODIUM CHLORIDE 0.9 % IV SOLN
1250.0000 mg | INTRAVENOUS | Status: DC
  Administered 2017-12-14: 1250 mg via INTRAVENOUS
  Filled 2017-12-14 (×3): qty 1250

## 2017-12-14 MED ORDER — BACITRACIN ZINC 500 UNIT/GM EX OINT: TOPICAL_OINTMENT | Freq: Two times a day (BID) | CUTANEOUS | 0 refills | Status: AC

## 2017-12-14 MED ORDER — APIXABAN 5 MG PO TABS: ORAL_TABLET | ORAL | 0 refills | Status: AC

## 2017-12-14 MED ORDER — AMOXICILLIN-POT CLAVULANATE 875-125 MG PO TABS: 1.0000 | ORAL_TABLET | Freq: Two times a day (BID) | ORAL | 0 refills | Status: DC

## 2017-12-14 MED ORDER — SODIUM CHLORIDE 0.9% FLUSH: 10.0000 mL | INTRAVENOUS | Status: DC | PRN

## 2017-12-14 MED ORDER — TRIAMCINOLONE ACETONIDE 0.5 % EX OINT: 1.0000 "application " | TOPICAL_OINTMENT | Freq: Two times a day (BID) | CUTANEOUS | 0 refills | Status: AC

## 2017-12-14 NOTE — Discharge Instructions (Signed)
Information on my medicine - ELIQUIS (apixaban)  This medication education was reviewed with me or my healthcare representative as part of my discharge preparation.   Why was Eliquis prescribed for you? Eliquis was prescribed to treat blood clots that may have been found in the veins of your legs (deep vein thrombosis) or in your lungs (pulmonary embolism) and to reduce the risk of them occurring again.  What do You need to know about Eliquis ? The starting dose is 10 mg (two 5 mg tablets) taken TWICE daily for the FIRST SEVEN (7) DAYS, then on April 13th  the dose is reduced to ONE 5 mg tablet taken TWICE daily.  Eliquis may be taken with or without food.   Try to take the dose about the same time in the morning and in the evening. If you have difficulty swallowing the tablet whole please discuss with your pharmacist how to take the medication safely.  Take Eliquis exactly as prescribed and DO NOT stop taking Eliquis without talking to the doctor who prescribed the medication.  Stopping may increase your risk of developing a new blood clot.  Refill your prescription before you run out.  After discharge, you should have regular check-up appointments with your healthcare provider that is prescribing your Eliquis.    What do you do if you miss a dose? If a dose of ELIQUIS is not taken at the scheduled time, take it as soon as possible on the same day and twice-daily administration should be resumed. The dose should not be doubled to make up for a missed dose.  Important Safety Information A possible side effect of Eliquis is bleeding. You should call your healthcare provider right away if you experience any of the following: ? Bleeding from an injury or your nose that does not stop. ? Unusual colored urine (red or dark brown) or unusual colored stools (red or black). ? Unusual bruising for unknown reasons. ? A serious fall or if you hit your head (even if there is no  bleeding).  Some medicines may interact with Eliquis and might increase your risk of bleeding or clotting while on Eliquis. To help avoid this, consult your healthcare provider or pharmacist prior to using any new prescription or non-prescription medications, including herbals, vitamins, non-steroidal anti-inflammatory drugs (NSAIDs) and supplements.  This website has more information on Eliquis (apixaban): http://www.eliquis.com/eliquis/home

## 2017-12-14 NOTE — Care Management Important Message (Signed)
Important Message  Patient Details  Name: Gerald Alvarez. MRN: [id number] Date of Birth: [birth year]   Medicare Important Message Given:  Yes  Signed IM notice given    Katrina Stack, RN 12/14/2017, 9:23 AM

## 2017-12-14 NOTE — Consult Note (Signed)
Pharmacy Antibiotic Note  Gerald Alvarez. is a 84 y.o. male admitted on 12/10/2017 with cellulitis.  Pharmacy has been consulted for Vancomycin dosing. Patient received Vancomycin 1g x 1 dose in ED. Zosyn added on 4/5.   Plan: Ke: 0.048  T1/2: 14.4   Vd: 44.8  Vancomycin 1000 IV every 24 hours started with 10 hour stack dosing.  Goal trough 10-15 mcg/mL. Calculated trough borderline low. Will increase to vancomycin 1250 mg IV q24h for calculated trough at Css of 13.3. Trough level before 4th dose.  Will monitor renal function and adjust dose as needed. SCr ordered for AM.   04/08 AM vanc level 8. Changed to 1250 mg q 18 hours. Level before 4th new dose.  Zosyn 3.375 g IV q8h EI.   Height: 5\' 4"  (162.6 cm) Weight: 140 lb (63.5 kg) IBW/kg (Calculated) : 59.2  Temp (24hrs), Avg:98.2 F (36.8 C), Min:97.6 F (36.4 C), Max:98.5 F (36.9 C)  Recent Labs  Lab 12/08/17 0955 12/10/17 1234 12/10/17 1320 12/11/17 0805 12/12/17 0514 12/13/17 0604 12/14/17 0424  WBC 3.3* 2.6*  --  2.6* 2.3* 1.9* 2.2*  CREATININE 0.98 0.73  --   --  0.81 0.74 0.68  LATICACIDVEN  --   --  0.8  --   --   --   --   VANCOTROUGH  --   --   --   --   --   --  8*    Estimated Creatinine Clearance: 52.4 mL/min (by C-G formula based on SCr of 0.68 mg/dL).    Allergies  Allergen Reactions  . Sulfa Antibiotics Nausea And Vomiting    Antimicrobials this admission: 0404 Vancomycin >> 4/5 Zosyn >>  Microbiology results: 0404 BCx: NGTD  Thank you for allowing pharmacy to be a part of this patient's care.  Eloise Harman, PharmD, BCPS Clinical Pharmacist 12/14/2017 5:04 AM

## 2017-12-14 NOTE — Clinical Social Work Note (Addendum)
CSW presented bed offers to patient and his family, they have agreed to [facility].  CSW contacted [facility], and they can accept patient once insurance has been approved.  [facility] will start insurance authorization, CSW updated attending physician and bedside nurse.  5:45pm  CSW attempted to contact patient's daughter Warren Lacy, [phone], and left a message informing her that CSW has not received insurance auth yet.   Jones Broom. Norval Morton, MSW, [place]  12/14/2017 3:38 PM

## 2017-12-14 NOTE — Discharge Summary (Signed)
[facility] NAME: Gerald Alvarez    MR#:  [id number]  DATE OF BIRTH:  1926-02-23  DATE OF ADMISSION:  12/10/2017   ADMITTING PHYSICIAN: Gorden Harms, MD  DATE OF DISCHARGE: 12/14/2017  PRIMARY CARE PHYSICIAN: Rusty Aus, MD   ADMISSION DIAGNOSIS:   Cellulitis of left lower extremity [L03.116] Acute deep vein thrombosis (DVT) of popliteal vein of left lower extremity ([place]) [I82.432]  DISCHARGE DIAGNOSIS:   Active Problems:   Malignant neoplasm of pancreas (HCC)   Cellulitis   Acute deep vein thrombosis (DVT) of popliteal vein of left lower extremity (HCC)   Leucopenia   SECONDARY DIAGNOSIS:   Past Medical History:  Diagnosis Date  . BPH (benign prostatic hyperplasia)   . Diabetes mellitus without complication ([place])   . Incontinence   . Nocturia   . OAB (overactive bladder)   . Pancreatic mass   . Prostate cancer [facility])     HOSPITAL COURSE:   84 year old male with past medical history significant for recently diagnosed pancreatic cancer on chemotherapy,diabetes mellitus, history of prostate cancer presents to hospital secondary to left foot swelling and redness and the blister.  1. Left lower extremity cellulitis with blister-received IV vancomycin and Zosyn in the hospital -Uses triamcinolone ointment on that leg. -Keep the leg elevated, blister has been drained by podiatry -Discharged on Augmentin. Since patient has sulfa allergy, apply bacitracin twice a day with dressing changes to the blistered area. -Outpatient follow-up - Physical Therapy recommended rehabilitation.  2. Acute left lower extremity DVT- high risk due to pancreatic cancer -Started on eliquis.  3. Pancreatic adenocarcinoma-continue outpatient follow-up with oncology. On chemotherapy  4. Diabetes mellitus-decrease the dose of Levemir as his sugars have been low normal while in the hospital.  5. Depression and anxiety-continue  outpatient medications  6. Pancytopenia-stable secondary to chemotherapy and underlying cancer  Physical therapy recommended rehabilitation   DISCHARGE CONDITIONS:   Guarded  CONSULTS OBTAINED:   Treatment Team:  Lloyd Huger, MD Albertine Patricia, DPM  DRUG ALLERGIES:   Allergies  Allergen Reactions  . Sulfa Antibiotics Nausea And Vomiting   DISCHARGE MEDICATIONS:   Allergies as of 12/14/2017      Reactions   Sulfa Antibiotics Nausea And Vomiting      Medication List    STOP taking these medications   doxycycline 100 MG capsule Commonly known as:  VIBRAMYCIN   lidocaine-prilocaine cream Commonly known as:  EMLA     TAKE these medications   acetaminophen 325 MG tablet Commonly known as:  TYLENOL Take 2 tablets (650 mg total) by mouth every 6 (six) hours as needed for mild pain (or Fever >/= 101).   ALPRAZolam 0.25 MG tablet Commonly known as:  XANAX Take 1 tablet (0.25 mg total) by mouth at bedtime as needed for anxiety.   amoxicillin-clavulanate 875-125 MG tablet Commonly known as:  AUGMENTIN Take 1 tablet by mouth every 12 (twelve) hours for 10 days.   apixaban 5 MG Tabs tablet Commonly known as:  ELIQUIS Take 2 tablets twice a a day until 12/18/17 and then 1 tablet twice a day after that   bacitracin ointment Apply topically 2 (two) times daily. Apply to the blistered area on the medial left ankle once or twice a day   doxazosin 2 MG tablet Commonly known as:  CARDURA Take 2 mg by mouth daily.   DULoxetine 20 MG capsule Commonly known as:  CYMBALTA Take 1 capsule by  mouth daily.   HYDROcodone-acetaminophen 5-325 MG tablet Commonly known as:  NORCO/VICODIN Take 1 tablet by mouth every 6 (six) hours as needed for moderate pain or severe pain.   hydrOXYzine 25 MG capsule Commonly known as:  VISTARIL Take 12.5 mg by mouth 3 (three) times daily as needed for itching.   LEVEMIR FLEXTOUCH 100 UNIT/ML Pen Generic drug:  Insulin  Detemir Inject 5 Units into the skin 2 (two) times daily. What changed:    how much to take  when to take this   megestrol 40 MG tablet Commonly known as:  MEGACE Take 40 mg by mouth daily.   MULTI-VITAMINS Tabs Take 1 tablet by mouth daily.   ondansetron 4 MG tablet Commonly known as:  ZOFRAN Take 1 tablet (4 mg total) by mouth every 8 (eight) hours as needed for nausea or vomiting.   pantoprazole 40 MG tablet Commonly known as:  PROTONIX TAKE 1 TABLET TWICE A DAY   polyethylene glycol packet Commonly known as:  MIRALAX / GLYCOLAX Take 17 g by mouth daily as needed for mild constipation.   prochlorperazine 10 MG tablet Commonly known as:  COMPAZINE Take 1 tablet (10 mg total) by mouth every 6 (six) hours as needed (Nausea or vomiting).   protein supplement shake Liqd Commonly known as:  PREMIER PROTEIN Take 325 mLs (11 oz total) by mouth 2 (two) times daily between meals.   sertraline 50 MG tablet Commonly known as:  ZOLOFT Take 1 tablet by mouth daily.   spironolactone 25 MG tablet Commonly known as:  ALDACTONE Take 25 mg by mouth daily.   traZODone 50 MG tablet Commonly known as:  DESYREL Take 50 mg by mouth at bedtime.   triamcinolone ointment 0.5 % Commonly known as:  KENALOG Apply 1 application topically 2 (two) times daily. Do not apply to the blister part on the left ankle What changed:  additional instructions   vitamin B-12 1000 MCG tablet Commonly known as:  CYANOCOBALAMIN Take 1,000 mcg by mouth.   Vitamin D3 2000 units capsule Take 2,000 Units by mouth daily.        DISCHARGE INSTRUCTIONS:   1. PCP f/u in 1-2 weeks 2. Podiatry follow-up in 1 week 3.Oncology follow-up as prior scheduled  DIET:   Cardiac diet  ACTIVITY:   Activity as tolerated  OXYGEN:   Home Oxygen: No.  Oxygen Delivery: room air  DISCHARGE LOCATION:   nursing home   If you experience worsening of your admission symptoms, develop shortness of breath,  life threatening emergency, suicidal or homicidal thoughts you must seek medical attention immediately by calling 911 or calling your MD immediately  if symptoms less severe.  You Must read complete instructions/literature along with all the possible adverse reactions/side effects for all the Medicines you take and that have been prescribed to you. Take any new Medicines after you have completely understood and accpet all the possible adverse reactions/side effects.   Please note  You were cared for by a hospitalist during your hospital stay. If you have any questions about your discharge medications or the care you received while you were in the hospital after you are discharged, you can call the unit and asked to speak with the hospitalist on call if the hospitalist that took care of you is not available. Once you are discharged, your primary care physician will handle any further medical issues. Please note that NO REFILLS for any discharge medications will be authorized once you are discharged, as it  is imperative that you return to your primary care physician (or establish a relationship with a primary care physician if you do not have one) for your aftercare needs so that they can reassess your need for medications and monitor your lab values.    On the day of Discharge:  VITAL SIGNS:   Blood pressure 129/67, pulse 84, temperature 98.3 F (36.8 C), temperature source Oral, resp. rate 18, height 5\' 4"  (1.626 m), weight 63.5 kg (140 lb), SpO2 96 %.  PHYSICAL EXAMINATION:    GENERAL:  84 y.o.-year-old elderly patient lying in the bed with no acute distress.  EYES: Pupils equal, round, reactive to light and accommodation. No scleral icterus. Extraocular muscles intact.  HEENT: Head atraumatic, normocephalic. Oropharynx and nasopharynx clear.  NECK:  Supple, no jugular venous distention. No thyroid enlargement, no tenderness.  LUNGS: Normal breath sounds bilaterally, no wheezing,  rales,rhonchi or crepitation. No use of accessory muscles of respiration. Decreased bibasilar breath sounds CARDIOVASCULAR: S1, S2 normal. No rubs, or gallops. 3/6 systolic murmur is present ABDOMEN: Soft, non-tender, non-distended. Bowel sounds present. No organomegaly or mass.  EXTREMITIES: left foot erythema noted, dressing in place around the ankle.No right pedal edema, cyanosis, or clubbing.  NEUROLOGIC: Cranial nerves II through XII are intact. Muscle strength 5/5 in all extremities. Sensation intact. Gait not checked.  PSYCHIATRIC: The patient is alert and oriented x 3.  SKIN: No obvious rash, lesion, or ulcer.   DATA REVIEW:   CBC Recent Labs  Lab 12/14/17 0424  WBC 2.2*  HGB 10.1*  HCT 30.6*  PLT 228    Chemistries  Recent Labs  Lab 12/10/17 1234 12/12/17 0514  12/14/17 0424  NA 138 139  --   --   K 4.0 3.9  --   --   CL 111 112*  --   --   CO2 21* 21*  --   --   GLUCOSE 116* 151*  --   --   BUN 16 12  --   --   CREATININE 0.73 0.81   < > 0.68  CALCIUM 8.5* 8.0*  --   --   AST 17  --   --   --   ALT 10*  --   --   --   ALKPHOS 49  --   --   --   BILITOT 1.1  --   --   --    < > = values in this interval not displayed.     Microbiology Results  Results for orders placed or performed during the hospital encounter of 12/10/17  Blood culture (routine x 2)     Status: None (Preliminary result)   Collection Time: 12/10/17 12:34 PM  Result Value Ref Range Status   Specimen Description BLOOD RIGHT CHEST PORT  Final   Special Requests   Final    BOTTLES DRAWN AEROBIC AND ANAEROBIC Blood Culture adequate volume   Culture   Final    NO GROWTH 4 DAYS Performed at [facility], [address], [place], [place] [address]    Report Status PENDING  Incomplete  Blood culture (routine x 2)     Status: None (Preliminary result)   Collection Time: 12/10/17  1:23 PM  Result Value Ref Range Status   Specimen Description BLOOD LEFT ANTECUBITAL  Final   Special  Requests Blood Culture adequate volume  Final   Culture   Final    NO GROWTH 4 DAYS Performed at [facility], [address] [place], [place] [address]    Report Status PENDING  Incomplete  Aerobic Culture (superficial specimen)     Status: None (Preliminary result)   Collection Time: 12/11/17  1:03 PM  Result Value Ref Range Status   Specimen Description   Final    ANKLE LEFT ANKLE CULTURE Performed at [facility], [address], [place], [place] [address]    Special Requests   Final    NONE Performed at [facility], [address] [place], [place] [address]    Gram Stain   Final    RARE WBC PRESENT,BOTH PMN AND MONONUCLEAR NO ORGANISMS SEEN    Culture   Final    NO GROWTH 2 DAYS Performed at [facility], [place] [address] [place], [place] [address]    Report Status PENDING  Incomplete    RADIOLOGY:  No results found.   Management plans discussed with the patient, family and they are in agreement.  CODE STATUS:     Code Status Orders  (From admission, onward)        Start     Ordered   12/10/17 2040  Full code  Continuous     12/10/17 2040    Code Status History    Date Active Date Inactive Code Status Order ID Comments User Context   09/25/2017 2317 09/28/2017 1716 Full Code [id number]  Dustin Flock, MD Inpatient    Advance Directive Documentation     Most Recent Value  Type of Advance Directive  Healthcare Power of [place], Living will  Pre-existing out of facility DNR order (yellow form or pink MOST form)  -  "MOST" Form in Place?  -      TOTAL TIME TAKING CARE OF THIS PATIENT: 38 minutes.    Gladstone Lighter M.D on 12/14/2017 at 10:56 AM  Between 7am to 6pm - Pager - [phone]  After 6pm go to [email or web address] - Proofreader  [facility]  Office  [phone]  CC: Primary care physician; Rusty Aus, MD   Note: This dictation was prepared with Dragon dictation  along with smaller phrase technology. Any transcriptional errors that result from this process are unintentional.

## 2017-12-14 NOTE — Progress Notes (Signed)
[facility]                                                      Patient Demographics  Gerald Alvarez, is a 84 y.o. male   MRN: [id number]   DOB - [birth year]  Admit Date - 12/10/2017    Outpatient Primary MD for the patient is Rusty Aus, MD  Consult requested in the Hospital by Gladstone Lighter, MD, On 12/14/2017   With History of -  Past Medical History:  Diagnosis Date  . BPH (benign prostatic hyperplasia)   . Diabetes mellitus without complication ([place])   . Incontinence   . Nocturia   . OAB (overactive bladder)   . Pancreatic mass   . Prostate cancer [facility])       Past Surgical History:  Procedure Laterality Date  . CATARACT EXTRACTION, BILATERAL Bilateral   . CHOLECYSTECTOMY    . ENDOSCOPIC RETROGRADE CHOLANGIOPANCREATOGRAPHY (ERCP) WITH PROPOFOL N/A 09/27/2017   Procedure: ENDOSCOPIC RETROGRADE CHOLANGIOPANCREATOGRAPHY (ERCP) WITH PROPOFOL;  Surgeon: Lucilla Lame, MD;  Location: [facility] ENDOSCOPY;  Service: Endoscopy;  Laterality: N/A;  . ERCP N/A 11/17/2017   Procedure: ENDOSCOPIC RETROGRADE CHOLANGIOPANCREATOGRAPHY (ERCP);  Surgeon: Lucilla Lame, MD;  Location: [facility] ENDOSCOPY;  Service: Endoscopy;  Laterality: N/A;  . HERNIA REPAIR     x 3  . PORTA CATH INSERTION N/A 10/22/2017   Procedure: PORTA CATH INSERTION;  Surgeon: Algernon Huxley, MD;  Location: [place] CV LAB;  Service: Cardiovascular;  Laterality: N/A;  . PROSTATECTOMY      in for   No chief complaint on file.    HPI  Gerald Alvarez  is a 84 y.o. male, hospitalized now for 3-4 days with cellulitis to the left foot and large blister on the left ankle.  He is doing better overall standpoint has gradually progressed during his hospital stay.    Review of Systems    In addition to the HPI above,  No Fever-chills, No Headache, No changes with Vision or hearing, No problems  swallowing food or Liquids, No Chest pain, Cough or Shortness of Breath, No Abdominal pain, No Nausea or Vommitting, Bowel movements are regular, No Blood in stool or Urine, No dysuria, No new skin rashes or bruises, No new joints pains-aches,  No new weakness, tingling, numbness in any extremity, No recent weight gain or loss, No polyuria, polydypsia or polyphagia, No significant Mental Stressors.  A full 10 point Review of Systems was done, except as stated above, all other Review of Systems were negative.   Social History Social History   Tobacco Use  . Smoking status: Former Smoker    Last attempt to quit: 03/15/1967    Years since quitting: 50.7  . Smokeless tobacco: Former Systems developer    Types: Chew  . Tobacco comment: quit long time  Substance Use Topics  . Alcohol use: No    Alcohol/week: 0.0 oz    Family History Family History  Problem Relation Age of Onset  . Prostate cancer Brother   . Bladder Cancer Brother   . Pancreatic cancer Brother   . Prostate cancer Brother   . Lymphoma Mother   . Leukemia Father   . Breast cancer Sister   . Uterine cancer Sister   . Kidney cancer Neg Hx     Prior to Admission medications  Medication Sig Start Date End Date Taking? Authorizing Provider  ALPRAZolam (XANAX) 0.25 MG tablet Take 1 tablet (0.25 mg total) by mouth at bedtime as needed for anxiety. 12/01/17  Yes Lloyd Huger, MD  Cholecalciferol (VITAMIN D3) 2000 units capsule Take 2,000 Units by mouth daily.  03/05/17  Yes [provider]  doxycycline (VIBRAMYCIN) 100 MG capsule Take 1 capsule by mouth 2 (two) times daily. 12/09/17  Yes [provider]  DULoxetine (CYMBALTA) 20 MG capsule Take 1 capsule by mouth daily. 09/12/17  Yes [provider]  megestrol (MEGACE) 40 MG tablet Take 40 mg by mouth daily. 10/30/17  Yes [provider]  Multiple Vitamin (MULTI-VITAMINS) TABS Take 1 tablet by mouth daily.    Yes [provider]   pantoprazole (PROTONIX) 40 MG tablet TAKE 1 TABLET TWICE A DAY 02/08/15  Yes [provider]  sertraline (ZOLOFT) 50 MG tablet Take 1 tablet by mouth daily. 07/14/17  Yes [provider]  spironolactone (ALDACTONE) 25 MG tablet Take 25 mg by mouth daily.   Yes [provider]  traZODone (DESYREL) 50 MG tablet Take 50 mg by mouth at bedtime.  12/29/16  Yes [provider]  vitamin B-12 (CYANOCOBALAMIN) 1000 MCG tablet Take 1,000 mcg by mouth.    Yes [provider]  acetaminophen (TYLENOL) 325 MG tablet Take 2 tablets (650 mg total) by mouth every 6 (six) hours as needed for mild pain (or Fever >/= 101). 12/14/17   Gladstone Lighter, MD  amoxicillin-clavulanate (AUGMENTIN) 875-125 MG tablet Take 1 tablet by mouth every 12 (twelve) hours for 10 days. 12/14/17 12/24/17  Gladstone Lighter, MD  apixaban (ELIQUIS) 5 MG TABS tablet Take 2 tablets twice a a day until 12/18/17 and then 1 tablet twice a day after that 12/14/17   Gladstone Lighter, MD  bacitracin ointment Apply topically 2 (two) times daily. Apply to the blistered area on the medial left ankle once or twice a day 12/14/17   Gladstone Lighter, MD  doxazosin (CARDURA) 2 MG tablet Take 2 mg by mouth daily.  12/31/16 12/31/17  [provider]  HYDROcodone-acetaminophen (NORCO/VICODIN) 5-325 MG tablet Take 1 tablet by mouth every 6 (six) hours as needed for moderate pain or severe pain. 12/14/17   Gladstone Lighter, MD  hydrOXYzine (VISTARIL) 25 MG capsule Take 12.5 mg by mouth 3 (three) times daily as needed for itching.    [provider]  LEVEMIR FLEXTOUCH 100 UNIT/ML Pen Inject 5 Units into the skin 2 (two) times daily. 12/14/17   Gladstone Lighter, MD  lidocaine-prilocaine (EMLA) cream Apply to affected area once 10/16/17   Lloyd Huger, MD  ondansetron (ZOFRAN) 4 MG tablet Take 1 tablet (4 mg total) by mouth every 8 (eight) hours as needed for nausea or vomiting. 10/27/17   Lloyd Huger, MD  polyethylene glycol (MIRALAX / Floria Raveling) packet Take 17 g by mouth daily as needed for mild constipation. 12/14/17   Gladstone Lighter, MD  prochlorperazine (COMPAZINE) 10 MG tablet Take 1 tablet (10 mg total) by mouth every 6 (six) hours as needed (Nausea or vomiting). 10/16/17   Lloyd Huger, MD  protein supplement shake (PREMIER PROTEIN) LIQD Take 325 mLs (11 oz total) by mouth 2 (two) times daily between meals. 09/28/17   Loletha Grayer, MD  triamcinolone ointment (KENALOG) 0.5 % Apply 1 application topically 2 (two) times daily. Do not apply to the blister part on the left ankle 12/14/17   Gladstone Lighter, MD  Anti-infectives (From admission, onward)   Start     Dose/Rate Route Frequency Ordered Stop   12/14/17 2200  vancomycin (VANCOCIN) 1,250 mg in sodium chloride 0.9 % 250 mL IVPB     1,250 mg 166.7 mL/hr over 90 Minutes Intravenous Every 18 hours 12/14/17 0503     12/14/17 0000  amoxicillin-clavulanate (AUGMENTIN) 875-125 MG tablet     1 tablet Oral Every 12 hours 12/14/17 1056 12/24/17 2359   12/12/17 0400  vancomycin (VANCOCIN) 1,250 mg in sodium chloride 0.9 % 250 mL IVPB  Status:  Discontinued     1,250 mg 166.7 mL/hr over 90 Minutes Intravenous Every 24 hours 12/11/17 0923 12/14/17 0503   12/11/17 1200  piperacillin-tazobactam (ZOSYN) IVPB 3.375 g     3.375 g 12.5 mL/hr over 240 Minutes Intravenous Every 8 hours 12/11/17 1141     12/11/17 0400  vancomycin (VANCOCIN) IVPB 1000 mg/200 mL premix  Status:  Discontinued     1,000 mg 200 mL/hr over 60 Minutes Intravenous Every 24 hours 12/10/17 1924 12/11/17 0923   12/10/17 1745  vancomycin (VANCOCIN) IVPB 1000 mg/200 mL premix  Status:  Discontinued     1,000 mg 200 mL/hr over 60 Minutes Intravenous  Once 12/10/17 1730 12/10/17 1730   12/10/17 1745  vancomycin (VANCOCIN) IVPB 1000 mg/200 mL premix  Status:  Discontinued     1,000 mg 200 mL/hr over 60 Minutes Intravenous  Once 12/10/17 1731 12/10/17 1746   12/10/17  1700  vancomycin (VANCOCIN) IVPB 1000 mg/200 mL premix     1,000 mg 200 mL/hr over 60 Minutes Intravenous  Once 12/10/17 1650 12/10/17 1847      Scheduled Meds: . apixaban  10 mg Oral BID   Followed by  . [START ON 12/18/2017] apixaban  5 mg Oral BID  . bacitracin   Topical BID  . cholecalciferol  2,000 Units Oral Daily  . DULoxetine  20 mg Oral Daily  . insulin aspart  0-5 Units Subcutaneous QHS  . insulin aspart  0-9 Units Subcutaneous TID WC  . multivitamin with minerals  1 tablet Oral Daily  . pantoprazole  40 mg Oral BID  . protein supplement shake  11 oz Oral BID BM  . QUEtiapine  25 mg Oral Once  . sertraline  50 mg Oral Daily  . sodium chloride flush  3 mL Intravenous Q12H  . spironolactone  25 mg Oral Daily  . traZODone  50 mg Oral QHS  . triamcinolone ointment  1 application Topical BID  . vitamin B-12  1,000 mcg Oral Daily   Continuous Infusions: . sodium chloride    . piperacillin-tazobactam (ZOSYN)  IV Stopped (12/14/17 4098)  . vancomycin     PRN Meds:.sodium chloride, acetaminophen **OR** acetaminophen, ALPRAZolam, haloperidol lactate, HYDROcodone-acetaminophen, hydrOXYzine, lactulose, ondansetron, polyethylene glycol, prochlorperazine, promethazine, sodium chloride flush  Allergies  Allergen Reactions  . Sulfa Antibiotics Nausea And Vomiting    Physical Exam: Patient alert well oriented pleasant lives with his family also.  Vitals  Blood pressure 129/67, pulse 84, temperature 98.3 F (36.8 C), temperature source Oral, resp. rate 18, height 5\' 4"  (1.626 m), weight 63.5 kg (140 lb), SpO2 96 %.  Lower Extremity exam: Exam of the left foot ankle lower leg shows improved cellulitis.  Redness is reduced the inflammation is reduced temperature is reduced.  The blister area on the medial ankle and foot shows stabilization.  The blisters not refilled and is stable from overall standpoint.  Culture of the blister showed no growth  after 2 days. Data  Review  CBC Recent Labs  Lab 12/08/17 0955 12/10/17 1234 12/11/17 0805 12/12/17 0514 12/13/17 0604 12/14/17 0424  WBC 3.3* 2.6* 2.6* 2.3* 1.9* 2.2*  HGB 11.5* 11.1* 11.3* 10.5* 10.2* 10.1*  HCT 32.7* 32.4* 32.8* 30.5* 30.3* 30.6*  PLT 243 195 181 179 199 228  MCV 101.3* 100.6* 101.8* 100.7* 101.8* 102.6*  MCH 35.5* 34.5* 35.0* 34.6* 34.2* 33.8  MCHC 35.0 34.3 34.4 34.4 33.6 32.9  RDW 14.2 14.4 14.7* 14.5 14.6* 14.5  LYMPHSABS 1.2 0.8*  --  0.8*  --  0.9*  MONOABS 0.2 0.1*  --  0.1*  --  0.1*  EOSABS 0.2 0.1  --  0.1  --  0.2  BASOSABS 0.0 0.0  --  0.0  --  0.0   ------------------------------------------------------------------------------------------------------------------  Chemistries  Recent Labs  Lab 12/08/17 0955 12/10/17 1234 12/12/17 0514 12/13/17 0604 12/14/17 0424  NA 136 138 139  --   --   K 3.9 4.0 3.9  --   --   CL 109 111 112*  --   --   CO2 19* 21* 21*  --   --   GLUCOSE 135* 116* 151*  --   --   BUN 16 16 12   --   --   CREATININE 0.98 0.73 0.81 0.74 0.68  CALCIUM 8.7* 8.5* 8.0*  --   --   AST 17 17  --   --   --   ALT 11* 10*  --   --   --   ALKPHOS 53 49  --   --   --   BILITOT 0.4 1.1  --   --   --    ------------------------------------------------------------------------------------------------   Assessment & Plan: Think overall patient is considerably better.  He is significantly immunocompromised and the blister area still needs to continue to have bacitracin put on a daily basis with a gauze dressing heavy Kerlix wrap and will Ace wrap around the area.  The triamcinolone cream can be used on the non-broken areas of skin for 2 more days then I would discontinue that.  I will see him back in 2 weeks to my office to recheck on the blister area and see how he is doing with it.  Active Problems:   Malignant neoplasm of pancreas (HCC)   Cellulitis   Acute deep vein thrombosis (DVT) of popliteal vein of left lower extremity ([place])    Leucopenia   Family Communication: Plan discussed with patient and family  Albertine Patricia M.D on 12/14/2017 at 12:10 PM  Thank you for the consult, we will follow the patient with you in the Hospital.

## 2017-12-15 LAB — CULTURE, BLOOD (ROUTINE X 2)
Culture: NO GROWTH
Culture: NO GROWTH
SPECIAL REQUESTS: ADEQUATE
Special Requests: ADEQUATE

## 2017-12-15 LAB — GLUCOSE, CAPILLARY
GLUCOSE-CAPILLARY: 136 mg/dL — AB (ref 65–99)
Glucose-Capillary: 146 mg/dL — ABNORMAL HIGH (ref 65–99)

## 2017-12-15 MED ORDER — HEPARIN SOD (PORK) LOCK FLUSH 100 UNIT/ML IV SOLN
500.0000 [IU] | Freq: Once | INTRAVENOUS | Status: AC
  Administered 2017-12-15: 500 [IU] via INTRAVENOUS
  Filled 2017-12-15: qty 5

## 2017-12-15 NOTE — Progress Notes (Signed)
Patient was supposed to be discharged to rehab yesterday, however pending insurance authorization discharge did not happen yesterday

## 2017-12-15 NOTE — Discharge Planning (Addendum)
Port de-accessed and heparin flushed. Discharge papers signed, placed in chart and papers in packet.  Signed pain script also in packet.  Report called to [facility] s/w Elmyra Ricks Minor, LPN.  EMS contacted to transport to room 601 when available. Waiting on arrival.

## 2017-12-15 NOTE — Clinical Social Work Placement (Signed)
   CLINICAL SOCIAL WORK PLACEMENT  NOTE  Date:  12/15/2017  Patient Details  Name: Gerald Alvarez. MRN: [id number] Date of Birth: 01-Mar-1926  Clinical Social Work is seeking post-discharge placement for this patient at the [place] level of care (*CSW will initial, date and re-position this form in  chart as items are completed):  Yes   Patient/family provided with [facility] Work Department's list of facilities offering this level of care within the geographic area requested by the patient (or if unable, by the patient's family).  Yes   Patient/family informed of their freedom to choose among providers that offer the needed level of care, that participate in Medicare, Medicaid or managed care program needed by the patient, have an available bed and are willing to accept the patient.  Yes   Patient/family informed of [facility]'s ownership interest in [facility] and [facility], as well as of the fact that they are under no obligation to receive care at these facilities.  PASRR submitted to EDS on 12/13/17     PASRR number received on 12/13/17     Existing PASRR number confirmed on       FL2 transmitted to all facilities in geographic area requested by pt/family on 12/13/17     FL2 transmitted to all facilities within larger geographic area on       Patient informed that his/her managed care company has contracts with or will negotiate with certain facilities, including the following:        Yes   Patient/family informed of bed offers received.  Patient chooses bed at [facility]     Physician recommends and patient chooses bed at      Patient to be transferred to [facility] on 12/15/17.  Patient to be transferred to facility by [facility]     Patient family notified on 12/15/17 of transfer.  Name of family member notified:  Patient's daughter [name] was at bedside.     PHYSICIAN Please sign FL2      Additional Comment:    _______________________________________________ Ross Ludwig, LCSWA 12/15/2017, 12:25 PM

## 2017-12-15 NOTE — Discharge Summary (Signed)
[facility] [place] NAME: Gerald Alvarez    MR#:  [id number]  DATE OF BIRTH:  26-Dec-1925  DATE OF ADMISSION:  12/10/2017   ADMITTING PHYSICIAN: Gorden Harms, MD  DATE OF DISCHARGE: 12/15/2017  PRIMARY CARE PHYSICIAN: Rusty Aus, MD   ADMISSION DIAGNOSIS:   Cellulitis of left lower extremity [L03.116] Acute deep vein thrombosis (DVT) of popliteal vein of left lower extremity ([place]) [I82.432]  DISCHARGE DIAGNOSIS:   Active Problems:   Malignant neoplasm of pancreas (HCC)   Cellulitis   Acute deep vein thrombosis (DVT) of popliteal vein of left lower extremity (HCC)   Leucopenia   SECONDARY DIAGNOSIS:   Past Medical History:  Diagnosis Date  . BPH (benign prostatic hyperplasia)   . Diabetes mellitus without complication ([place])   . Incontinence   . Nocturia   . OAB (overactive bladder)   . Pancreatic mass   . Prostate cancer [facility])     HOSPITAL COURSE:   84 year old male with past medical history significant for recently diagnosed pancreatic cancer on chemotherapy,diabetes mellitus, history of prostate cancer presents to hospital secondary to left foot swelling and redness and the blister.  1. Left lower extremity cellulitis with blister-received IV vancomycin and Zosyn in the hospital -Uses triamcinolone ointment on that leg.  Can use the triamcinolone to the red areas of the foot, but avoid an open blister area -Keep the leg elevated, blister has been drained by podiatry -Discharge on Augmentin. Since patient has sulfa allergy, apply bacitracin twice a day with dressing changes to the blistered area. -Outpatient follow-up - Physical Therapy recommended rehabilitation.  2. Acute left lower extremity DVT- high risk due to pancreatic cancer -Started on eliquis.  3. Pancreatic adenocarcinoma-continue outpatient follow-up with oncology. On chemotherapy  4. Diabetes mellitus-decrease the dose of Levemir as his sugars  have been low normal while in the hospital.  5. Depression and anxiety-continue outpatient medications  6. Pancytopenia-stable secondary to chemotherapy and underlying cancer  Physical therapy recommended rehabilitation   DISCHARGE CONDITIONS:   Guarded  CONSULTS OBTAINED:   Treatment Team:  Lloyd Huger, MD Albertine Patricia, DPM  DRUG ALLERGIES:   Allergies  Allergen Reactions  . Sulfa Antibiotics Nausea And Vomiting   DISCHARGE MEDICATIONS:   Allergies as of 12/15/2017      Reactions   Sulfa Antibiotics Nausea And Vomiting      Medication List    STOP taking these medications   doxycycline 100 MG capsule Commonly known as:  VIBRAMYCIN   lidocaine-prilocaine cream Commonly known as:  EMLA     TAKE these medications   acetaminophen 325 MG tablet Commonly known as:  TYLENOL Take 2 tablets (650 mg total) by mouth every 6 (six) hours as needed for mild pain (or Fever >/= 101).   ALPRAZolam 0.25 MG tablet Commonly known as:  XANAX Take 1 tablet (0.25 mg total) by mouth at bedtime as needed for anxiety.   amoxicillin-clavulanate 875-125 MG tablet Commonly known as:  AUGMENTIN Take 1 tablet by mouth every 12 (twelve) hours for 10 days.   apixaban 5 MG Tabs tablet Commonly known as:  ELIQUIS Take 2 tablets twice a a day until 12/18/17 and then 1 tablet twice a day after that   bacitracin ointment Apply topically 2 (two) times daily. Apply to the blistered area on the medial left ankle once or twice a day   doxazosin 2 MG tablet Commonly known as:  CARDURA Take 2 mg  by mouth daily.   DULoxetine 20 MG capsule Commonly known as:  CYMBALTA Take 1 capsule by mouth daily.   HYDROcodone-acetaminophen 5-325 MG tablet Commonly known as:  NORCO/VICODIN Take 1 tablet by mouth every 6 (six) hours as needed for moderate pain or severe pain.   hydrOXYzine 25 MG capsule Commonly known as:  VISTARIL Take 12.5 mg by mouth 3 (three) times daily as needed for  itching.   LEVEMIR FLEXTOUCH 100 UNIT/ML Pen Generic drug:  Insulin Detemir Inject 5 Units into the skin 2 (two) times daily. What changed:    how much to take  when to take this   megestrol 40 MG tablet Commonly known as:  MEGACE Take 40 mg by mouth daily.   MULTI-VITAMINS Tabs Take 1 tablet by mouth daily.   ondansetron 4 MG tablet Commonly known as:  ZOFRAN Take 1 tablet (4 mg total) by mouth every 8 (eight) hours as needed for nausea or vomiting.   pantoprazole 40 MG tablet Commonly known as:  PROTONIX TAKE 1 TABLET TWICE A DAY   polyethylene glycol packet Commonly known as:  MIRALAX / GLYCOLAX Take 17 g by mouth daily as needed for mild constipation.   prochlorperazine 10 MG tablet Commonly known as:  COMPAZINE Take 1 tablet (10 mg total) by mouth every 6 (six) hours as needed (Nausea or vomiting).   protein supplement shake Liqd Commonly known as:  PREMIER PROTEIN Take 325 mLs (11 oz total) by mouth 2 (two) times daily between meals.   sertraline 50 MG tablet Commonly known as:  ZOLOFT Take 1 tablet by mouth daily.   spironolactone 25 MG tablet Commonly known as:  ALDACTONE Take 25 mg by mouth daily.   traZODone 50 MG tablet Commonly known as:  DESYREL Take 50 mg by mouth at bedtime.   triamcinolone ointment 0.5 % Commonly known as:  KENALOG Apply 1 application topically 2 (two) times daily. Do not apply to the blister part on the left ankle What changed:  additional instructions   vitamin B-12 1000 MCG tablet Commonly known as:  CYANOCOBALAMIN Take 1,000 mcg by mouth.   Vitamin D3 2000 units capsule Take 2,000 Units by mouth daily.        DISCHARGE INSTRUCTIONS:   1. PCP f/u in 1-2 weeks 2. Podiatry follow-up in 1-2  weeks 3.Oncology follow-up as prior scheduled  DIET:   Cardiac diet  ACTIVITY:   Activity as tolerated  OXYGEN:   Home Oxygen: No.  Oxygen Delivery: room air  DISCHARGE LOCATION:   nursing home   If you  experience worsening of your admission symptoms, develop shortness of breath, life threatening emergency, suicidal or homicidal thoughts you must seek medical attention immediately by calling 911 or calling your MD immediately  if symptoms less severe.  You Must read complete instructions/literature along with all the possible adverse reactions/side effects for all the Medicines you take and that have been prescribed to you. Take any new Medicines after you have completely understood and accpet all the possible adverse reactions/side effects.   Please note  You were cared for by a hospitalist during your hospital stay. If you have any questions about your discharge medications or the care you received while you were in the hospital after you are discharged, you can call the unit and asked to speak with the hospitalist on call if the hospitalist that took care of you is not available. Once you are discharged, your primary care physician will handle any further medical  issues. Please note that NO REFILLS for any discharge medications will be authorized once you are discharged, as it is imperative that you return to your primary care physician (or establish a relationship with a primary care physician if you do not have one) for your aftercare needs so that they can reassess your need for medications and monitor your lab values.    On the day of Discharge:  VITAL SIGNS:   Blood pressure (!) 151/79, pulse 80, temperature 98 F (36.7 C), temperature source Oral, resp. rate 17, height 5\' 4"  (1.626 m), weight 63.5 kg (140 lb), SpO2 97 %.  PHYSICAL EXAMINATION:    GENERAL:  84 y.o.-year-old elderly patient lying in the bed with no acute distress.  EYES: Pupils equal, round, reactive to light and accommodation. No scleral icterus. Extraocular muscles intact.  HEENT: Head atraumatic, normocephalic. Oropharynx and nasopharynx clear.  NECK:  Supple, no jugular venous distention. No thyroid enlargement, no  tenderness.  LUNGS: Normal breath sounds bilaterally, no wheezing, rales,rhonchi or crepitation. No use of accessory muscles of respiration. Decreased bibasilar breath sounds CARDIOVASCULAR: S1, S2 normal. No rubs, or gallops. 3/6 systolic murmur is present ABDOMEN: Soft, non-tender, non-distended. Bowel sounds present. No organomegaly or mass.  EXTREMITIES: left foot erythema noted, dressing in place around the ankle.No right pedal edema, cyanosis, or clubbing.  NEUROLOGIC: Cranial nerves II through XII are intact. Muscle strength 5/5 in all extremities. Sensation intact. Gait not checked.  PSYCHIATRIC: The patient is alert and oriented x 3.  SKIN: No obvious rash, lesion, or ulcer.   DATA REVIEW:   CBC Recent Labs  Lab 12/14/17 0424  WBC 2.2*  HGB 10.1*  HCT 30.6*  PLT 228    Chemistries  Recent Labs  Lab 12/10/17 1234 12/12/17 0514  12/14/17 0424  NA 138 139  --   --   K 4.0 3.9  --   --   CL 111 112*  --   --   CO2 21* 21*  --   --   GLUCOSE 116* 151*  --   --   BUN 16 12  --   --   CREATININE 0.73 0.81   < > 0.68  CALCIUM 8.5* 8.0*  --   --   AST 17  --   --   --   ALT 10*  --   --   --   ALKPHOS 49  --   --   --   BILITOT 1.1  --   --   --    < > = values in this interval not displayed.     Microbiology Results  Results for orders placed or performed during the hospital encounter of 12/10/17  Blood culture (routine x 2)     Status: None   Collection Time: 12/10/17 12:34 PM  Result Value Ref Range Status   Specimen Description BLOOD RIGHT CHEST PORT  Final   Special Requests   Final    BOTTLES DRAWN AEROBIC AND ANAEROBIC Blood Culture adequate volume   Culture   Final    NO GROWTH 5 DAYS Performed at [facility], [address], [place], [place] [address]    Report Status 12/15/2017 FINAL  Final  Blood culture (routine x 2)     Status: None   Collection Time: 12/10/17  1:23 PM  Result Value Ref Range Status   Specimen Description BLOOD LEFT  ANTECUBITAL  Final   Special Requests Blood Culture adequate volume  Final  Culture   Final    NO GROWTH 5 DAYS Performed at [facility], [address] [place], [place] [address]    Report Status 12/15/2017 FINAL  Final  Aerobic Culture (superficial specimen)     Status: None   Collection Time: 12/11/17  1:03 PM  Result Value Ref Range Status   Specimen Description   Final    ANKLE LEFT ANKLE CULTURE Performed at [facility], [address] [place], [place] [address]    Special Requests   Final    NONE Performed at [facility], [address] [place], [place] [address]    Gram Stain   Final    RARE WBC PRESENT,BOTH PMN AND MONONUCLEAR NO ORGANISMS SEEN    Culture   Final    NO GROWTH 2 DAYS Performed at [facility], [place] [address] [place], [place] [address]    Report Status 12/14/2017 FINAL  Final    RADIOLOGY:  No results found.   Management plans discussed with the patient, family and they are in agreement.  CODE STATUS:     Code Status Orders  (From admission, onward)        Start     Ordered   12/10/17 2040  Full code  Continuous     12/10/17 2040    Code Status History    Date Active Date Inactive Code Status Order ID Comments User Context   09/25/2017 2317 09/28/2017 1716 Full Code [id number]  Dustin Flock, MD Inpatient    Advance Directive Documentation     Most Recent Value  Type of Advance Directive  Healthcare Power of [place], Living will  Pre-existing out of facility DNR order (yellow form or pink MOST form)  -  "MOST" Form in Place?  -      TOTAL TIME TAKING CARE OF THIS PATIENT: 38 minutes.    Gladstone Lighter M.D on 12/15/2017 at 12:01 PM  Between 7am to 6pm - Pager - [phone]  After 6pm go to [email or web address] - Proofreader  [employer]  Hospitalists  Office  [phone]  CC: Primary care physician; Rusty Aus, MD   Note: This dictation was prepared with  Dragon dictation along with smaller phrase technology. Any transcriptional errors that result from this process are unintentional.

## 2017-12-15 NOTE — Clinical Social Work Note (Addendum)
CSW received phone call that patient's insurance company has approved for SNF placement.  Patient to be d/c'ed today to [facility] room 601. Patient and family agreeable to plans will transport via ems RN to call report to 600 hall nurse at [phone].  Patient's daughter is at bedside and is aware of transfer to SNF.   Evette Cristal, MSW, [place]

## 2017-12-19 ENCOUNTER — Emergency Department: Payer: Medicare Other

## 2017-12-19 ENCOUNTER — Other Ambulatory Visit: Payer: Self-pay

## 2017-12-19 ENCOUNTER — Inpatient Hospital Stay
Admission: EM | Admit: 2017-12-19 | Discharge: 2017-12-26 | DRG: 480 | Disposition: A | Discharge status: Hospice | Payer: Medicare Other | Attending: Internal Medicine | Admitting: Internal Medicine

## 2017-12-19 DIAGNOSIS — R627 Adult failure to thrive: Secondary | ICD-10-CM | POA: Diagnosis present

## 2017-12-19 DIAGNOSIS — N3281 Overactive bladder: Secondary | ICD-10-CM | POA: Diagnosis present

## 2017-12-19 DIAGNOSIS — R52 Pain, unspecified: Secondary | ICD-10-CM | POA: Diagnosis not present

## 2017-12-19 DIAGNOSIS — E876 Hypokalemia: Secondary | ICD-10-CM | POA: Diagnosis present

## 2017-12-19 DIAGNOSIS — F0390 Unspecified dementia without behavioral disturbance: Secondary | ICD-10-CM | POA: Diagnosis present

## 2017-12-19 DIAGNOSIS — J189 Pneumonia, unspecified organism: Secondary | ICD-10-CM | POA: Diagnosis not present

## 2017-12-19 DIAGNOSIS — Z7901 Long term (current) use of anticoagulants: Secondary | ICD-10-CM

## 2017-12-19 DIAGNOSIS — W19XXXA Unspecified fall, initial encounter: Secondary | ICD-10-CM

## 2017-12-19 DIAGNOSIS — G9340 Encephalopathy, unspecified: Secondary | ICD-10-CM | POA: Diagnosis not present

## 2017-12-19 DIAGNOSIS — S72001A Fracture of unspecified part of neck of right femur, initial encounter for closed fracture: Secondary | ICD-10-CM

## 2017-12-19 DIAGNOSIS — Z8052 Family history of malignant neoplasm of bladder: Secondary | ICD-10-CM

## 2017-12-19 DIAGNOSIS — R05 Cough: Secondary | ICD-10-CM

## 2017-12-19 DIAGNOSIS — L03116 Cellulitis of left lower limb: Secondary | ICD-10-CM | POA: Diagnosis present

## 2017-12-19 DIAGNOSIS — R638 Other symptoms and signs concerning food and fluid intake: Secondary | ICD-10-CM

## 2017-12-19 DIAGNOSIS — R059 Cough, unspecified: Secondary | ICD-10-CM

## 2017-12-19 DIAGNOSIS — S72141A Displaced intertrochanteric fracture of right femur, initial encounter for closed fracture: Principal | ICD-10-CM | POA: Diagnosis present

## 2017-12-19 DIAGNOSIS — Z419 Encounter for procedure for purposes other than remedying health state, unspecified: Secondary | ICD-10-CM

## 2017-12-19 DIAGNOSIS — C250 Malignant neoplasm of head of pancreas: Secondary | ICD-10-CM | POA: Diagnosis present

## 2017-12-19 DIAGNOSIS — Z9221 Personal history of antineoplastic chemotherapy: Secondary | ICD-10-CM

## 2017-12-19 DIAGNOSIS — Z87891 Personal history of nicotine dependence: Secondary | ICD-10-CM

## 2017-12-19 DIAGNOSIS — E11621 Type 2 diabetes mellitus with foot ulcer: Secondary | ICD-10-CM | POA: Diagnosis present

## 2017-12-19 DIAGNOSIS — Z515 Encounter for palliative care: Secondary | ICD-10-CM | POA: Diagnosis not present

## 2017-12-19 DIAGNOSIS — Z806 Family history of leukemia: Secondary | ICD-10-CM

## 2017-12-19 DIAGNOSIS — Z8042 Family history of malignant neoplasm of prostate: Secondary | ICD-10-CM

## 2017-12-19 DIAGNOSIS — Z66 Do not resuscitate: Secondary | ICD-10-CM | POA: Diagnosis present

## 2017-12-19 DIAGNOSIS — F05 Delirium due to known physiological condition: Secondary | ICD-10-CM | POA: Diagnosis not present

## 2017-12-19 DIAGNOSIS — D62 Acute posthemorrhagic anemia: Secondary | ICD-10-CM | POA: Diagnosis not present

## 2017-12-19 DIAGNOSIS — Z794 Long term (current) use of insulin: Secondary | ICD-10-CM

## 2017-12-19 DIAGNOSIS — Z807 Family history of other malignant neoplasms of lymphoid, hematopoietic and related tissues: Secondary | ICD-10-CM

## 2017-12-19 DIAGNOSIS — Z8546 Personal history of malignant neoplasm of prostate: Secondary | ICD-10-CM

## 2017-12-19 DIAGNOSIS — Y92129 Unspecified place in nursing home as the place of occurrence of the external cause: Secondary | ICD-10-CM

## 2017-12-19 DIAGNOSIS — W1830XA Fall on same level, unspecified, initial encounter: Secondary | ICD-10-CM | POA: Diagnosis present

## 2017-12-19 DIAGNOSIS — Y95 Nosocomial condition: Secondary | ICD-10-CM | POA: Diagnosis not present

## 2017-12-19 DIAGNOSIS — Z86718 Personal history of other venous thrombosis and embolism: Secondary | ICD-10-CM

## 2017-12-19 DIAGNOSIS — L97429 Non-pressure chronic ulcer of left heel and midfoot with unspecified severity: Secondary | ICD-10-CM | POA: Diagnosis present

## 2017-12-19 HISTORY — DX: Fracture of unspecified part of neck of right femur, initial encounter for closed fracture: S72.001A

## 2017-12-19 LAB — COMPREHENSIVE METABOLIC PANEL
ALBUMIN: 2.8 g/dL — AB (ref 3.5–5.0)
ALT: 17 U/L (ref 17–63)
ANION GAP: 3 — AB (ref 5–15)
AST: 26 U/L (ref 15–41)
Alkaline Phosphatase: 44 U/L (ref 38–126)
BUN: 18 mg/dL (ref 6–20)
CHLORIDE: 110 mmol/L (ref 101–111)
CO2: 22 mmol/L (ref 22–32)
Calcium: 8.2 mg/dL — ABNORMAL LOW (ref 8.9–10.3)
Creatinine, Ser: 0.72 mg/dL (ref 0.61–1.24)
GFR calc Af Amer: >60 mL/min (ref >60)
GFR calc non Af Amer: >60 mL/min (ref >60)
GLUCOSE: 181 mg/dL — AB (ref 65–99)
Potassium: 3.3 mmol/L — ABNORMAL LOW (ref 3.5–5.1)
Sodium: 135 mmol/L (ref 135–145)
TOTAL PROTEIN: 5.8 g/dL — AB (ref 6.5–8.1)
Total Bilirubin: 0.6 mg/dL (ref 0.3–1.2)

## 2017-12-19 LAB — CBC WITH DIFFERENTIAL/PLATELET
BASOS ABS: 0 10*3/uL (ref 0–0.1)
BASOS PCT: 1 %
EOS ABS: 0.1 10*3/uL (ref 0–0.7)
EOS PCT: 2 %
HCT: 31.7 % — ABNORMAL LOW (ref 40.0–52.0)
HEMOGLOBIN: 10.7 g/dL — AB (ref 13.0–18.0)
Lymphocytes Relative: 32 %
Lymphs Abs: 1.5 10*3/uL (ref 1.0–3.6)
MCH: 34.2 pg — ABNORMAL HIGH (ref 26.0–34.0)
MCHC: 33.7 g/dL (ref 32.0–36.0)
MCV: 101.5 fL — ABNORMAL HIGH (ref 80.0–100.0)
Monocytes Absolute: 0.9 10*3/uL (ref 0.2–1.0)
Monocytes Relative: 21 %
NEUTROS ABS: 2.1 10*3/uL (ref 1.4–6.5)
Neutrophils Relative %: 44 %
PLATELETS: 281 10*3/uL (ref 150–440)
RBC: 3.12 MIL/uL — AB (ref 4.40–5.90)
RDW: 16.4 % — ABNORMAL HIGH (ref 11.5–14.5)
WBC: 4.6 10*3/uL (ref 3.8–10.6)

## 2017-12-19 LAB — PROTIME-INR
INR: 1.2
Prothrombin Time: 15.1 seconds (ref 11.4–15.2)

## 2017-12-19 LAB — APTT: APTT: 28 s (ref 24–36)

## 2017-12-19 MED ORDER — FENTANYL CITRATE (PF) 100 MCG/2ML IJ SOLN
50.0000 ug | INTRAMUSCULAR | Status: AC | PRN
  Administered 2017-12-19 – 2017-12-20 (×2): 50 ug via INTRAVENOUS
  Filled 2017-12-19 (×2): qty 2

## 2017-12-19 MED ORDER — HYDROCODONE-ACETAMINOPHEN 5-325 MG PO TABS: 1.0000 | ORAL_TABLET | Freq: Four times a day (QID) | ORAL | Status: DC | PRN

## 2017-12-19 MED ORDER — MORPHINE SULFATE (PF) 2 MG/ML IV SOLN: 0.5000 mg | INTRAVENOUS | Status: DC | PRN

## 2017-12-19 NOTE — ED Notes (Signed)
Pt reports he has pancreatic cancer and is receiving treatments, pt has port. Pt also reports infection to lower left extremity, family in rm states it is cellulitis.

## 2017-12-19 NOTE — ED Notes (Signed)
Patient transported to X-ray 

## 2017-12-19 NOTE — ED Provider Notes (Signed)
[facility] Emergency Department Provider Note   ____________________________________________    I have reviewed the triage vital signs and the nursing notes.   HISTORY  Chief Complaint Fall     HPI Gerald Alvarez. is a 84 y.o. male with a history of pancreatic cancer, diabetes, current DVT on Eliquis who presents after an unwitnessed fall.  Patient complains only of right hip pain.  He is not sure if he hit his head.  He presents from nursing home.  Denies abdominal pain.  No back pain.  No nausea or vomiting or chest pain.  Complains of severe sharp pain in the right hip when he moves it   Past Medical History:  Diagnosis Date  . BPH (benign prostatic hyperplasia)   . Diabetes mellitus without complication ([place])   . Incontinence   . Nocturia   . OAB (overactive bladder)   . Pancreatic mass   . Prostate cancer [facility])     Patient Active Problem List   Diagnosis Date Noted  . Leucopenia   . Acute deep vein thrombosis (DVT) of popliteal vein of left lower extremity ([place])   . Cellulitis 12/10/2017  . Malignant neoplasm of pancreas ([place]) 10/13/2017  . Malnutrition of moderate degree 09/28/2017  . Common bile duct obstruction   . Pancreatic mass   . Jaundice 09/25/2017  . Low serum vitamin D 03/05/2017  . Adult idiopathic generalized osteoporosis 01/19/2017  . Vertebral compression fracture, with routine healing, subsequent encounter 01/19/2017  . B12 deficiency 10/23/2016  . Medicare annual wellness visit, initial 09/04/2016  . DD (diverticular disease) 03/10/2016  . Calculus of kidney 03/10/2016  . Bilateral tinnitus 03/10/2016  . Cardiomyopathy ([place]) 08/25/2014  . H/O malignant neoplasm of prostate 08/25/2014    Past Surgical History:  Procedure Laterality Date  . CATARACT EXTRACTION, BILATERAL Bilateral   . CHOLECYSTECTOMY    . ENDOSCOPIC RETROGRADE CHOLANGIOPANCREATOGRAPHY (ERCP) WITH PROPOFOL N/A 09/27/2017   Procedure:  ENDOSCOPIC RETROGRADE CHOLANGIOPANCREATOGRAPHY (ERCP) WITH PROPOFOL;  Surgeon: Lucilla Lame, MD;  Location: [facility] ENDOSCOPY;  Service: Endoscopy;  Laterality: N/A;  . ERCP N/A 11/17/2017   Procedure: ENDOSCOPIC RETROGRADE CHOLANGIOPANCREATOGRAPHY (ERCP);  Surgeon: Lucilla Lame, MD;  Location: [facility] ENDOSCOPY;  Service: Endoscopy;  Laterality: N/A;  . HERNIA REPAIR     x 3  . PORTA CATH INSERTION N/A 10/22/2017   Procedure: PORTA CATH INSERTION;  Surgeon: Algernon Huxley, MD;  Location: [place] CV LAB;  Service: Cardiovascular;  Laterality: N/A;  . PROSTATECTOMY      Prior to Admission medications   Medication Sig Start Date End Date Taking? Authorizing Provider  acetaminophen (TYLENOL) 325 MG tablet Take 2 tablets (650 mg total) by mouth every 6 (six) hours as needed for mild pain (or Fever >/= 101). 12/14/17  Yes Gladstone Lighter, MD  ALPRAZolam Duanne Moron) 0.25 MG tablet Take 1 tablet (0.25 mg total) by mouth at bedtime as needed for anxiety. 12/01/17  Yes Lloyd Huger, MD  amoxicillin-clavulanate (AUGMENTIN) 875-125 MG tablet Take 1 tablet by mouth every 12 (twelve) hours for 10 days. 12/14/17 12/24/17 Yes Gladstone Lighter, MD  apixaban (ELIQUIS) 5 MG TABS tablet Take 2 tablets twice a a day until 12/18/17 and then 1 tablet twice a day after that 12/14/17  Yes Gladstone Lighter, MD  bacitracin ointment Apply topically 2 (two) times daily. Apply to the blistered area on the medial left ankle once or twice a day 12/14/17  Yes Gladstone Lighter, MD  Cholecalciferol (VITAMIN D3) 2000  units capsule Take 2,000 Units by mouth daily.  03/05/17  Yes [provider]  doxazosin (CARDURA) 2 MG tablet Take 2 mg by mouth daily.  12/31/16 12/31/17 Yes [provider]  DULoxetine (CYMBALTA) 20 MG capsule Take 1 capsule by mouth daily. 09/12/17  Yes [provider]  HYDROcodone-acetaminophen (NORCO/VICODIN) 5-325 MG tablet Take 1 tablet by mouth every 6 (six) hours as needed for moderate  pain or severe pain. 12/14/17  Yes Gladstone Lighter, MD  hydrOXYzine (VISTARIL) 25 MG capsule Take 12.5 mg by mouth 3 (three) times daily as needed for itching.   Yes [provider]  LEVEMIR FLEXTOUCH 100 UNIT/ML Pen Inject 5 Units into the skin 2 (two) times daily. 12/14/17  Yes Gladstone Lighter, MD  magnesium hydroxide (MILK OF MAGNESIA) 400 MG/5ML suspension Take 30 mLs by mouth daily.   Yes [provider]  megestrol (MEGACE) 40 MG tablet Take 40 mg by mouth daily. 10/30/17  Yes [provider]  Multiple Vitamin (MULTI-VITAMINS) TABS Take 1 tablet by mouth daily.    Yes [provider]  ondansetron (ZOFRAN) 4 MG tablet Take 1 tablet (4 mg total) by mouth every 8 (eight) hours as needed for nausea or vomiting. 10/27/17  Yes Lloyd Huger, MD  pantoprazole (PROTONIX) 40 MG tablet TAKE 1 TABLET TWICE A DAY 02/08/15  Yes [provider]  polyethylene glycol (MIRALAX / GLYCOLAX) packet Take 17 g by mouth daily as needed for mild constipation. 12/14/17  Yes Gladstone Lighter, MD  prochlorperazine (COMPAZINE) 10 MG tablet Take 1 tablet (10 mg total) by mouth every 6 (six) hours as needed (Nausea or vomiting). 10/16/17  Yes Lloyd Huger, MD  protein supplement shake (PREMIER PROTEIN) LIQD Take 325 mLs (11 oz total) by mouth 2 (two) times daily between meals. 09/28/17  Yes Wieting, Richard, MD  senna-docusate (SENOKOT-S) 8.6-50 MG tablet Take 1 tablet by mouth 2 (two) times daily.   Yes [provider]  sertraline (ZOLOFT) 50 MG tablet Take 1 tablet by mouth daily. 07/14/17  Yes [provider]  spironolactone (ALDACTONE) 25 MG tablet Take 25 mg by mouth daily.   Yes [provider]  traZODone (DESYREL) 50 MG tablet Take 50 mg by mouth at bedtime.  12/29/16  Yes [provider]  triamcinolone ointment (KENALOG) 0.5 % Apply 1 application topically 2 (two) times daily. Do not apply to the blister part on the left ankle  12/14/17  Yes Gladstone Lighter, MD  vitamin B-12 (CYANOCOBALAMIN) 1000 MCG tablet Take 1,000 mcg by mouth.    Yes [provider]     Allergies Sulfa antibiotics  Family History  Problem Relation Age of Onset  . Prostate cancer Brother   . Bladder Cancer Brother   . Pancreatic cancer Brother   . Prostate cancer Brother   . Lymphoma Mother   . Leukemia Father   . Breast cancer Sister   . Uterine cancer Sister   . Kidney cancer Neg Hx     Social History Social History   Tobacco Use  . Smoking status: Former Smoker    Last attempt to quit: 03/15/1967    Years since quitting: 50.8  . Smokeless tobacco: Former Systems developer    Types: Chew  . Tobacco comment: quit long time  Substance Use Topics  . Alcohol use: No    Alcohol/week: 0.0 oz  . Drug use: No    Review of Systems limited by dementia Constitutional: No dizziness Eyes: No visual changes.  ENT: No neck pain Cardiovascular: Denies chest pain. Respiratory: Denies shortness of breath. Gastrointestinal: No abdominal pain.   Genitourinary: Negative for groin injury Musculoskeletal: Right hip pain as above Skin: Negative for rash. Neurological: Negative for headaches    ____________________________________________   PHYSICAL EXAM:  VITAL SIGNS: ED Triage Vitals [12/19/17 2208]  Enc Vitals Group     BP (!) 146/73     Pulse Rate 74     Resp 16     Temp 97.7 F (36.5 C)     Temp Source Oral     SpO2 98 %     Weight 64.9 kg (143 lb)     Height 1.676 m (5\' 6" )     Head Circumference      Peak Flow      Pain Score 0     Pain Loc      Pain Edu?      Excl. in [place]?     Constitutional: AlertPleasant and interactive Eyes: Conjunctivae are normal.  Head: Atraumatic. Nose: No congestion/rhinnorhea. Mouth/Throat: Mucous membranes are moist.   Neck:  Painless ROM, no vertebral tenderness to palpation Cardiovascular: Normal rate, regular rhythm.   Good peripheral circulation. Respiratory: Normal respiratory  effort.  No retractions. Lungs CTAB. Gastrointestinal: Soft and nontender. No distention.  No CVA tenderness. Genitourinary: deferred Musculoskeletal: Patient with tenderness palpation over the right lateral hip, severe pain with axial load, 2+ distal pulses, warm and well perfused.  All other extremities normal range of motion.  Clavicles normal, nontender Neurologic:  Normal speech and language. No gross focal neurologic deficits are appreciated.  Skin:  Skin is warm, dry and intact. No rash noted.   ____________________________________________   LABS (all labs ordered are listed, but only abnormal results are displayed)  Labs Reviewed  CBC WITH DIFFERENTIAL/PLATELET - Abnormal; Notable for the following components:      Result Value   RBC 3.12 (*)    Hemoglobin 10.7 (*)    HCT 31.7 (*)    MCV 101.5 (*)    MCH 34.2 (*)    RDW 16.4 (*)    All other components within normal limits  COMPREHENSIVE METABOLIC PANEL - Abnormal; Notable for the following components:   Potassium 3.3 (*)    Glucose, Bld 181 (*)    Calcium 8.2 (*)    Total Protein 5.8 (*)    Albumin 2.8 (*)    Anion gap 3 (*)    All other components within normal limits  PROTIME-INR  APTT  TYPE AND SCREEN   ____________________________________________  EKG  ED ECG REPORT I, Lavonia Drafts, the attending physician, personally viewed and interpreted this ECG.  Date: 12/19/2017  Rhythm: normal sinus rhythm QRS Axis: normal Intervals: normal ST/T Wave abnormalities: Nonspecific Narrative Interpretation: PVCs noted  ____________________________________________  RADIOLOGY  X-ray hip demonstrates right intertrochanteric fracture Chest x-ray CT head and cervical spine no acute injury ____________________________________________   PROCEDURES  Procedure(s) performed: No  Procedures   Critical Care performed: No ____________________________________________   INITIAL IMPRESSION / ASSESSMENT AND PLAN /  ED COURSE  Pertinent labs & imaging results that were available during my care of the patient were reviewed by me and considered in my medical decision making (see chart for details).  Patient presents after unwitnessed fall, on Eliquis, will obtain CT head cervical spine.  Strongly suspect right hip fracture.  Fentanyl IV ordered.  Pending imaging  ----------------------------------------- 11:11 PM on 12/19/2017 -----------------------------------------  Right intertrochanteric hip fracture, discussed with Dr. Donivan Scull of  orthopedics he will see the patient, will admit to the medicine service    ____________________________________________   FINAL CLINICAL IMPRESSION(S) / ED DIAGNOSES  Final diagnoses:  Closed fracture of right hip, initial encounter [facility])  Fall, initial encounter        Note:  This document was prepared using Dragon voice recognition software and may include unintentional dictation errors.    Lavonia Drafts, MD 12/19/17 [phone]

## 2017-12-19 NOTE — ED Triage Notes (Addendum)
Pt from [facility] by EMS with reports pt was found on the floor. Per EMS pt with hx dementia and is not sure how he got on ground. Ems reports bulge to right hip, shortening and rotation noted to right leg. Pulses intact. Pt denies pain to hip, skin tear to right hand which pt reports is what is hurting him. Pt A&Ox3, pt unsure of time. Pt is on Eliquis.

## 2017-12-20 DIAGNOSIS — Z8052 Family history of malignant neoplasm of bladder: Secondary | ICD-10-CM | POA: Diagnosis not present

## 2017-12-20 DIAGNOSIS — Z8042 Family history of malignant neoplasm of prostate: Secondary | ICD-10-CM | POA: Diagnosis not present

## 2017-12-20 DIAGNOSIS — Z8546 Personal history of malignant neoplasm of prostate: Secondary | ICD-10-CM | POA: Diagnosis not present

## 2017-12-20 DIAGNOSIS — Y95 Nosocomial condition: Secondary | ICD-10-CM | POA: Diagnosis not present

## 2017-12-20 DIAGNOSIS — Y92129 Unspecified place in nursing home as the place of occurrence of the external cause: Secondary | ICD-10-CM | POA: Diagnosis not present

## 2017-12-20 DIAGNOSIS — S72141A Displaced intertrochanteric fracture of right femur, initial encounter for closed fracture: Secondary | ICD-10-CM | POA: Diagnosis present

## 2017-12-20 DIAGNOSIS — C250 Malignant neoplasm of head of pancreas: Secondary | ICD-10-CM | POA: Diagnosis present

## 2017-12-20 DIAGNOSIS — I82409 Acute embolism and thrombosis of unspecified deep veins of unspecified lower extremity: Secondary | ICD-10-CM | POA: Diagnosis not present

## 2017-12-20 DIAGNOSIS — Z7901 Long term (current) use of anticoagulants: Secondary | ICD-10-CM | POA: Diagnosis not present

## 2017-12-20 DIAGNOSIS — F0390 Unspecified dementia without behavioral disturbance: Secondary | ICD-10-CM | POA: Diagnosis present

## 2017-12-20 DIAGNOSIS — Z86718 Personal history of other venous thrombosis and embolism: Secondary | ICD-10-CM | POA: Diagnosis not present

## 2017-12-20 DIAGNOSIS — F05 Delirium due to known physiological condition: Secondary | ICD-10-CM | POA: Diagnosis not present

## 2017-12-20 DIAGNOSIS — L03116 Cellulitis of left lower limb: Secondary | ICD-10-CM | POA: Diagnosis present

## 2017-12-20 DIAGNOSIS — W1830XA Fall on same level, unspecified, initial encounter: Secondary | ICD-10-CM | POA: Diagnosis present

## 2017-12-20 DIAGNOSIS — N3281 Overactive bladder: Secondary | ICD-10-CM | POA: Diagnosis present

## 2017-12-20 DIAGNOSIS — Z806 Family history of leukemia: Secondary | ICD-10-CM | POA: Diagnosis not present

## 2017-12-20 DIAGNOSIS — E119 Type 2 diabetes mellitus without complications: Secondary | ICD-10-CM | POA: Diagnosis not present

## 2017-12-20 DIAGNOSIS — J189 Pneumonia, unspecified organism: Secondary | ICD-10-CM | POA: Diagnosis not present

## 2017-12-20 DIAGNOSIS — G9340 Encephalopathy, unspecified: Secondary | ICD-10-CM | POA: Diagnosis not present

## 2017-12-20 DIAGNOSIS — R638 Other symptoms and signs concerning food and fluid intake: Secondary | ICD-10-CM | POA: Diagnosis not present

## 2017-12-20 DIAGNOSIS — Z66 Do not resuscitate: Secondary | ICD-10-CM | POA: Diagnosis not present

## 2017-12-20 DIAGNOSIS — R52 Pain, unspecified: Secondary | ICD-10-CM | POA: Diagnosis present

## 2017-12-20 DIAGNOSIS — C259 Malignant neoplasm of pancreas, unspecified: Secondary | ICD-10-CM | POA: Diagnosis not present

## 2017-12-20 DIAGNOSIS — D62 Acute posthemorrhagic anemia: Secondary | ICD-10-CM | POA: Diagnosis not present

## 2017-12-20 DIAGNOSIS — E11621 Type 2 diabetes mellitus with foot ulcer: Secondary | ICD-10-CM | POA: Diagnosis present

## 2017-12-20 DIAGNOSIS — Z807 Family history of other malignant neoplasms of lymphoid, hematopoietic and related tissues: Secondary | ICD-10-CM | POA: Diagnosis not present

## 2017-12-20 DIAGNOSIS — Z9221 Personal history of antineoplastic chemotherapy: Secondary | ICD-10-CM | POA: Diagnosis not present

## 2017-12-20 DIAGNOSIS — E876 Hypokalemia: Secondary | ICD-10-CM | POA: Diagnosis present

## 2017-12-20 DIAGNOSIS — L97429 Non-pressure chronic ulcer of left heel and midfoot with unspecified severity: Secondary | ICD-10-CM | POA: Diagnosis present

## 2017-12-20 DIAGNOSIS — Z794 Long term (current) use of insulin: Secondary | ICD-10-CM | POA: Diagnosis not present

## 2017-12-20 DIAGNOSIS — Z87891 Personal history of nicotine dependence: Secondary | ICD-10-CM | POA: Diagnosis not present

## 2017-12-20 DIAGNOSIS — Z515 Encounter for palliative care: Secondary | ICD-10-CM | POA: Diagnosis not present

## 2017-12-20 LAB — MRSA PCR SCREENING: MRSA BY PCR: NEGATIVE

## 2017-12-20 LAB — GLUCOSE, CAPILLARY
GLUCOSE-CAPILLARY: 157 mg/dL — AB (ref 65–99)
GLUCOSE-CAPILLARY: 147 mg/dL — AB (ref 65–99)
Glucose-Capillary: 124 mg/dL — ABNORMAL HIGH (ref 65–99)

## 2017-12-20 LAB — MAGNESIUM: Magnesium: 2.1 mg/dL (ref 1.7–2.4)

## 2017-12-20 MED ORDER — PANTOPRAZOLE SODIUM 40 MG PO TBEC
40.0000 mg | DELAYED_RELEASE_TABLET | Freq: Two times a day (BID) | ORAL | Status: DC
  Administered 2017-12-20 – 2017-12-26 (×12): 40 mg via ORAL
  Filled 2017-12-20 (×12): qty 1

## 2017-12-20 MED ORDER — ADULT MULTIVITAMIN W/MINERALS CH
1.0000 | ORAL_TABLET | Freq: Every day | ORAL | Status: DC
  Administered 2017-12-20 – 2017-12-26 (×6): 1 via ORAL
  Filled 2017-12-20 (×6): qty 1

## 2017-12-20 MED ORDER — MEGESTROL ACETATE 40 MG PO TABS
40.0000 mg | ORAL_TABLET | Freq: Every day | ORAL | Status: DC
  Administered 2017-12-20: 40 mg via ORAL
  Filled 2017-12-20: qty 1

## 2017-12-20 MED ORDER — TRAMADOL HCL 50 MG PO TABS
50.0000 mg | ORAL_TABLET | Freq: Four times a day (QID) | ORAL | Status: DC | PRN
Start: 2017-12-20 — End: 2017-12-26
  Administered 2017-12-20 – 2017-12-26 (×8): 50 mg via ORAL
  Filled 2017-12-20 (×9): qty 1

## 2017-12-20 MED ORDER — SERTRALINE HCL 50 MG PO TABS
50.0000 mg | ORAL_TABLET | Freq: Every day | ORAL | Status: DC
  Administered 2017-12-20 – 2017-12-26 (×6): 50 mg via ORAL
  Filled 2017-12-20 (×6): qty 1

## 2017-12-20 MED ORDER — POLYETHYLENE GLYCOL 3350 17 G PO PACK: 17.0000 g | PACK | Freq: Every day | ORAL | Status: DC | PRN

## 2017-12-20 MED ORDER — POTASSIUM CHLORIDE CRYS ER 20 MEQ PO TBCR
20.0000 meq | EXTENDED_RELEASE_TABLET | Freq: Once | ORAL | Status: AC
  Administered 2017-12-20: 20 meq via ORAL
  Filled 2017-12-20: qty 1

## 2017-12-20 MED ORDER — POTASSIUM CHLORIDE CRYS ER 20 MEQ PO TBCR
40.0000 meq | EXTENDED_RELEASE_TABLET | Freq: Once | ORAL | Status: DC
  Administered 2017-12-20: 40 meq via ORAL

## 2017-12-20 MED ORDER — POTASSIUM CHLORIDE CRYS ER 20 MEQ PO TBCR
40.0000 meq | EXTENDED_RELEASE_TABLET | Freq: Once | ORAL | Status: AC
  Administered 2017-12-20: 40 meq via ORAL
  Filled 2017-12-20: qty 2

## 2017-12-20 MED ORDER — CEFAZOLIN SODIUM-DEXTROSE 2-4 GM/100ML-% IV SOLN
2.0000 g | INTRAVENOUS | Status: DC
  Filled 2017-12-20: qty 100

## 2017-12-20 MED ORDER — ALPRAZOLAM 0.25 MG PO TABS: 0.2500 mg | ORAL_TABLET | Freq: Two times a day (BID) | ORAL | Status: DC | PRN

## 2017-12-20 MED ORDER — ACETAMINOPHEN 325 MG PO TABS: 650.0000 mg | ORAL_TABLET | Freq: Four times a day (QID) | ORAL | Status: DC | PRN

## 2017-12-20 MED ORDER — BISACODYL 5 MG PO TBEC
5.0000 mg | DELAYED_RELEASE_TABLET | Freq: Every day | ORAL | Status: DC | PRN
  Administered 2017-12-23: 5 mg via ORAL
  Filled 2017-12-20 (×2): qty 1

## 2017-12-20 MED ORDER — SENNOSIDES-DOCUSATE SODIUM 8.6-50 MG PO TABS: 1.0000 | ORAL_TABLET | Freq: Every evening | ORAL | Status: DC | PRN

## 2017-12-20 MED ORDER — AMOXICILLIN-POT CLAVULANATE 875-125 MG PO TABS
1.0000 | ORAL_TABLET | Freq: Two times a day (BID) | ORAL | Status: DC
  Administered 2017-12-20 – 2017-12-24 (×9): 1 via ORAL
  Filled 2017-12-20 (×10): qty 1

## 2017-12-20 MED ORDER — HYDROXYZINE HCL 25 MG PO TABS
12.5000 mg | ORAL_TABLET | Freq: Three times a day (TID) | ORAL | Status: DC | PRN
  Filled 2017-12-20: qty 1

## 2017-12-20 MED ORDER — INSULIN ASPART 100 UNIT/ML ~~LOC~~ SOLN
0.0000 [IU] | Freq: Three times a day (TID) | SUBCUTANEOUS | Status: DC
  Administered 2017-12-20 (×2): 2 [IU] via SUBCUTANEOUS
  Administered 2017-12-21: 3 [IU] via SUBCUTANEOUS
  Administered 2017-12-21: 2 [IU] via SUBCUTANEOUS
  Administered 2017-12-22 (×2): 3 [IU] via SUBCUTANEOUS
  Administered 2017-12-22: 2 [IU] via SUBCUTANEOUS
  Administered 2017-12-23 (×3): 3 [IU] via SUBCUTANEOUS
  Administered 2017-12-24 (×3): 2 [IU] via SUBCUTANEOUS
  Administered 2017-12-25: 3 [IU] via SUBCUTANEOUS
  Administered 2017-12-25 – 2017-12-26 (×2): 2 [IU] via SUBCUTANEOUS
  Filled 2017-12-20 (×16): qty 1

## 2017-12-20 MED ORDER — HYDROMORPHONE HCL 1 MG/ML IJ SOLN
1.0000 mg | INTRAMUSCULAR | Status: DC | PRN
  Administered 2017-12-20 (×2): 1 mg via INTRAVENOUS
  Filled 2017-12-20 (×2): qty 1

## 2017-12-20 MED ORDER — SPIRONOLACTONE 25 MG PO TABS
25.0000 mg | ORAL_TABLET | Freq: Every day | ORAL | Status: DC
  Administered 2017-12-20 – 2017-12-26 (×6): 25 mg via ORAL
  Filled 2017-12-20 (×6): qty 1

## 2017-12-20 MED ORDER — DULOXETINE HCL 20 MG PO CPEP
20.0000 mg | ORAL_CAPSULE | Freq: Every day | ORAL | Status: DC
  Administered 2017-12-20 – 2017-12-26 (×6): 20 mg via ORAL
  Filled 2017-12-20 (×7): qty 1

## 2017-12-20 MED ORDER — TRAZODONE HCL 50 MG PO TABS
50.0000 mg | ORAL_TABLET | Freq: Every day | ORAL | Status: DC
  Administered 2017-12-20 – 2017-12-25 (×6): 50 mg via ORAL
  Filled 2017-12-20 (×6): qty 1

## 2017-12-20 MED ORDER — ONDANSETRON HCL 4 MG/2ML IJ SOLN: 4.0000 mg | Freq: Four times a day (QID) | INTRAMUSCULAR | Status: DC | PRN

## 2017-12-20 MED ORDER — BACITRACIN ZINC 500 UNIT/GM EX OINT
TOPICAL_OINTMENT | Freq: Two times a day (BID) | CUTANEOUS | Status: DC
  Administered 2017-12-21 – 2017-12-26 (×7): via TOPICAL
  Filled 2017-12-20 (×9): qty 0.9

## 2017-12-20 MED ORDER — INSULIN DETEMIR 100 UNIT/ML ~~LOC~~ SOLN
5.0000 [IU] | Freq: Two times a day (BID) | SUBCUTANEOUS | Status: AC
  Administered 2017-12-20: 5 [IU] via SUBCUTANEOUS
  Filled 2017-12-20: qty 0.05

## 2017-12-20 MED ORDER — TRIAMCINOLONE ACETONIDE 0.5 % EX OINT
1.0000 "application " | TOPICAL_OINTMENT | Freq: Two times a day (BID) | CUTANEOUS | Status: DC
  Administered 2017-12-20 – 2017-12-26 (×11): 1 via TOPICAL
  Filled 2017-12-20: qty 15

## 2017-12-20 MED ORDER — VITAMIN B-12 1000 MCG PO TABS
1000.0000 ug | ORAL_TABLET | ORAL | Status: DC
  Administered 2017-12-23 – 2017-12-25 (×2): 1000 ug via ORAL
  Filled 2017-12-20 (×3): qty 1

## 2017-12-20 MED ORDER — MAGNESIUM HYDROXIDE 400 MG/5ML PO SUSP
30.0000 mL | Freq: Every day | ORAL | Status: DC
Start: 2017-12-20 — End: 2017-12-26
  Administered 2017-12-22 – 2017-12-23 (×3): 30 mL via ORAL
  Filled 2017-12-20 (×4): qty 30

## 2017-12-20 MED ORDER — BACITRACIN ZINC 500 UNIT/GM EX OINT
TOPICAL_OINTMENT | Freq: Two times a day (BID) | CUTANEOUS | Status: DC
  Filled 2017-12-20: qty 28.35

## 2017-12-20 MED ORDER — ALPRAZOLAM 0.25 MG PO TABS
0.2500 mg | ORAL_TABLET | Freq: Every evening | ORAL | Status: DC | PRN
  Administered 2017-12-20 – 2017-12-25 (×4): 0.25 mg via ORAL
  Filled 2017-12-20 (×4): qty 1

## 2017-12-20 MED ORDER — DOXAZOSIN MESYLATE 2 MG PO TABS
2.0000 mg | ORAL_TABLET | Freq: Every day | ORAL | Status: DC
  Administered 2017-12-20 – 2017-12-26 (×6): 2 mg via ORAL
  Filled 2017-12-20 (×7): qty 1

## 2017-12-20 MED ORDER — VITAMIN D 1000 UNITS PO TABS
2000.0000 [IU] | ORAL_TABLET | Freq: Every day | ORAL | Status: DC
  Administered 2017-12-20 – 2017-12-26 (×6): 2000 [IU] via ORAL
  Filled 2017-12-20 (×6): qty 2

## 2017-12-20 MED ORDER — HYDROMORPHONE HCL 1 MG/ML IJ SOLN: 0.5000 mg | INTRAMUSCULAR | Status: DC | PRN

## 2017-12-20 NOTE — Progress Notes (Deleted)
Home  [phone] [phone]  ID: Gerald Alvarez. OB: 22-Jun-1926  MR#: [id number]  ERD#:[id number]  Patient Care Team: Rusty Aus, MD as PCP - General (Internal Medicine) Clent Jacks, RN as Registered Nurse   CHIEF COMPLAINT: Stage IIB pancreatic adenocarcinoma.  INTERVAL HISTORY: Patient returns to clinic today or further evaluation and consideration of cycle 2, day 8 of  Abraxane.  His rash has resolved since discontinuing gemcitabine.  Cellulitis on his foot has resolved as well.  He is having difficulty sleeping and therefore is more "worn out" during the day.  He also has increased anxiety. He has no neurologic complaints.  He denies any recent fevers or illnesses.  He has no chest pain or shortness of breath.  He denies any nausea, vomiting or diarrhea. He has occasional constipation.  He has no melena or hematochezia.  He has no urinary complaints.  Patient offers no further specific complaints today.  REVIEW OF SYSTEMS:   Review of Systems  Constitutional: Positive for malaise/fatigue. Negative for fever and weight loss.  Respiratory: Negative.  Negative for cough and shortness of breath.   Cardiovascular: Negative.  Negative for chest pain and leg swelling.  Gastrointestinal: Positive for constipation. Negative for abdominal pain, blood in stool, diarrhea, melena and nausea.  Genitourinary: Negative.  Negative for dysuria.  Musculoskeletal: Negative.   Skin: Negative.  Negative for itching and rash.  Neurological: Positive for weakness.  Psychiatric/Behavioral: The patient is nervous/anxious and has insomnia.     As per HPI. Otherwise, a complete review of systems is negative.  PAST MEDICAL HISTORY: Past Medical History:  Diagnosis Date  . BPH (benign prostatic hyperplasia)   . Closed fracture of right hip ([place])   . Diabetes mellitus without complication ([place])   . Incontinence   . Nocturia   . OAB (overactive  bladder)   . Pancreatic mass   . Prostate cancer ([place])     PAST SURGICAL HISTORY: Past Surgical History:  Procedure Laterality Date  . CATARACT EXTRACTION, BILATERAL Bilateral   . CHOLECYSTECTOMY    . ENDOSCOPIC RETROGRADE CHOLANGIOPANCREATOGRAPHY (ERCP) WITH PROPOFOL N/A 09/27/2017   Procedure: ENDOSCOPIC RETROGRADE CHOLANGIOPANCREATOGRAPHY (ERCP) WITH PROPOFOL;  Surgeon: Lucilla Lame, MD;  Location: [facility] ENDOSCOPY;  Service: Endoscopy;  Laterality: N/A;  . ERCP N/A 11/17/2017   Procedure: ENDOSCOPIC RETROGRADE CHOLANGIOPANCREATOGRAPHY (ERCP);  Surgeon: Lucilla Lame, MD;  Location: [facility] ENDOSCOPY;  Service: Endoscopy;  Laterality: N/A;  . HERNIA REPAIR     x 3  . PORTA CATH INSERTION N/A 10/22/2017   Procedure: PORTA CATH INSERTION;  Surgeon: Algernon Huxley, MD;  Location: [facility];  Service: Cardiovascular;  Laterality: N/A;  . PROSTATECTOMY      FAMILY HISTORY: Family History  Problem Relation Age of Onset  . Prostate cancer Brother   . Bladder Cancer Brother   . Pancreatic cancer Brother   . Prostate cancer Brother   . Lymphoma Mother   . Leukemia Father   . Breast cancer Sister   . Uterine cancer Sister   . Kidney cancer Neg Hx     ADVANCED DIRECTIVES (Y/N):  N  HEALTH MAINTENANCE: Social History   Tobacco Use  . Smoking status: Former Smoker    Last attempt to quit: 03/15/1967    Years since quitting: 50.8  . Smokeless tobacco: Former Systems developer    Types: Chew  . Tobacco comment: quit long time  Substance Use Topics  . Alcohol use: No  Alcohol/week: 0.0 oz  . Drug use: No     Colonoscopy:  PAP:  Bone density:  Lipid panel:  Allergies  Allergen Reactions  . Sulfa Antibiotics Nausea And Vomiting    No current facility-administered medications for this visit.    No current outpatient medications on file.   Facility-Administered Medications Ordered in Other Visits  Medication Dose Route Frequency Provider Last Rate Last Dose  . acetaminophen  (TYLENOL) tablet 650 mg  650 mg Oral Q6H PRN Arta Silence, MD      . ALPRAZolam Duanne Moron) tablet 0.25 mg  0.25 mg Oral QHS PRN Arta Silence, MD   0.25 mg at 12/20/17 2006  . ALPRAZolam Duanne Moron) tablet 0.25 mg  0.25 mg Oral BID PRN Fritzi Mandes, MD      . amoxicillin-clavulanate (AUGMENTIN) 875-125 MG per tablet 1 tablet  1 tablet Oral Q12H Arta Silence, MD   1 tablet at 12/20/17 1344  . bacitracin ointment   Topical BID Fritzi Mandes, MD      . bisacodyl (DULCOLAX) EC tablet 5 mg  5 mg Oral Daily PRN Arta Silence, MD      . Derrill Memo ON 12/21/2017] ceFAZolin (ANCEF) IVPB 2g/100 mL premix  2 g Intravenous 30 min Pre-Op Poggi, Marshall Cork, MD      . cholecalciferol (VITAMIN D) tablet 2,000 Units  2,000 Units Oral Daily Arta Silence, MD   2,000 Units at 12/20/17 0850  . doxazosin (CARDURA) tablet 2 mg  2 mg Oral Daily Arta Silence, MD   2 mg at 12/20/17 0849  . DULoxetine (CYMBALTA) DR capsule 20 mg  20 mg Oral Daily Arta Silence, MD   20 mg at 12/20/17 0851  . HYDROmorphone (DILAUDID) injection 0.5 mg  0.5 mg Intravenous Q3H PRN Arta Silence, MD       Or  . HYDROmorphone (DILAUDID) injection 1 mg  1 mg Intravenous Q3H PRN Arta Silence, MD   1 mg at 12/20/17 2053  . hydrOXYzine (ATARAX/VISTARIL) tablet 12.5 mg  12.5 mg Oral TID PRN Arta Silence, MD      . insulin aspart (novoLOG) injection 0-15 Units  0-15 Units Subcutaneous TID WC Arta Silence, MD   2 Units at 12/20/17 1309  . magnesium hydroxide (MILK OF MAGNESIA) suspension 30 mL  30 mL Oral Daily Arta Silence, MD      . multivitamin with minerals tablet 1 tablet  1 tablet Oral Daily Arta Silence, MD   1 tablet at 12/20/17 0850  . ondansetron (ZOFRAN) injection 4 mg  4 mg Intravenous Q6H PRN Arta Silence, MD      . pantoprazole (PROTONIX) EC tablet 40 mg  40 mg Oral BID Arta Silence, MD   40 mg at 12/20/17 2005  . polyethylene glycol (MIRALAX / GLYCOLAX)  packet 17 g  17 g Oral Daily PRN Arta Silence, MD      . senna-docusate (Senokot-S) tablet 1 tablet  1 tablet Oral QHS PRN Arta Silence, MD      . sertraline (ZOLOFT) tablet 50 mg  50 mg Oral Daily Arta Silence, MD   50 mg at 12/20/17 0851  . spironolactone (ALDACTONE) tablet 25 mg  25 mg Oral Daily Arta Silence, MD   25 mg at 12/20/17 0850  . traMADol (ULTRAM) tablet 50 mg  50 mg Oral Q6H PRN Fritzi Mandes, MD   50 mg at 12/20/17 1720  . traZODone (DESYREL) tablet 50 mg  50 mg Oral QHS Arta Silence, MD   50 mg at 12/20/17 2005  .  triamcinolone ointment (KENALOG) 0.5 % 1 application  1 application Topical BID Arta Silence, MD   1 application at 13/24/40 2133  . [START ON 12/21/2017] vitamin B-12 (CYANOCOBALAMIN) tablet 1,000 mcg  1,000 mcg Oral Q M,W,F Arta Silence, MD        OBJECTIVE: There were no vitals filed for this visit.   There is no height or weight on file to calculate BMI.    ECOG FS:0 - Asymptomatic  General: Well-developed, well-nourished, no acute distress. Eyes: Pink conjunctiva, anicteric sclera. Lungs: Clear to auscultation bilaterally. Heart: Regular rate and rhythm. No rubs, murmurs, or gallops. Abdomen: Soft, nontender, nondistended. No organomegaly noted, normoactive bowel sounds. Musculoskeletal: 1+ edema left lower extremity. Neuro: Alert, answering all questions appropriately. Cranial nerves grossly intact. Skin: Rash resolved.  Erythema on left lower extremity improved. Psych: Normal affect.   LAB RESULTS:  Lab Results  Component Value Date   NA 135 12/19/2017   K 3.3 (L) 12/19/2017   CL 110 12/19/2017   CO2 22 12/19/2017   GLUCOSE 181 (H) 12/19/2017   BUN 18 12/19/2017   CREATININE 0.72 12/19/2017   CALCIUM 8.2 (L) 12/19/2017   PROT 5.8 (L) 12/19/2017   ALBUMIN 2.8 (L) 12/19/2017   AST 26 12/19/2017   ALT 17 12/19/2017   ALKPHOS 44 12/19/2017   BILITOT 0.6 12/19/2017   GFRNONAA >60 12/19/2017   GFRAA  >60 12/19/2017    Lab Results  Component Value Date   WBC 4.6 12/19/2017   NEUTROABS 2.1 12/19/2017   HGB 10.7 (L) 12/19/2017   HCT 31.7 (L) 12/19/2017   MCV 101.5 (H) 12/19/2017   PLT 281 12/19/2017     STUDIES: Dg Chest 1 View  Result Date: 12/19/2017 CLINICAL DATA:  Dementia patient found on floor. Presumed unwitnessed fall. EXAM: CHEST  1 VIEW COMPARISON:  12/10/2017 FINDINGS: Tip of the right chest port in the distal SVC. Unchanged heart size and mediastinal contours with aortic atherosclerosis and tortuosity. No focal airspace opacity, pulmonary edema or pleural effusion. No pneumothorax. Bones are under mineralized without acute osseous abnormality. IMPRESSION: 1. No acute chest finding. 2. Aortic tortuosity and atherosclerosis. Electronically Signed   By: Jeb Levering M.D.   On: 12/19/2017 23:05   Dg Chest 1 View  Result Date: 12/10/2017 CLINICAL DATA:  Dyspnea. EXAM: CHEST  1 VIEW COMPARISON:  CT scan of January 04, 2017. FINDINGS: The heart size and mediastinal contours are within normal limits. Atherosclerosis of thoracic aorta is noted. Right internal jugular Port-A-Cath is noted with distal tip in expected position of cavoatrial junction. No pneumothorax or pleural effusion is noted. Right lung is clear. Minimal left basilar subsegmental atelectasis is noted. The visualized skeletal structures are unremarkable. IMPRESSION: Minimal left basilar subsegmental atelectasis. Aortic Atherosclerosis (ICD10-I70.0). Electronically Signed   By: Marijo Conception, M.D.   On: 12/10/2017 13:39   Ct Head Wo Contrast  Result Date: 12/19/2017 CLINICAL DATA:  Patient fell this evening.  Trauma to head and neck. EXAM: CT HEAD WITHOUT CONTRAST CT CERVICAL SPINE WITHOUT CONTRAST TECHNIQUE: Multidetector CT imaging of the head and cervical spine was performed following the standard protocol without intravenous contrast. Multiplanar CT image reconstructions of the cervical spine were also generated.  COMPARISON:  None. FINDINGS: CT HEAD FINDINGS BRAIN: There is sulcal and ventricular prominence consistent with superficial and central atrophy. No intraparenchymal hemorrhage, mass effect nor midline shift. Periventricular and subcortical white matter hypodensities consistent with chronic small vessel ischemic disease are identified. No acute large vascular territory  infarcts. No abnormal extra-axial fluid collections. Basal cisterns are not effaced and midline. VASCULAR: Moderate calcific atherosclerosis of the carotid siphons. SKULL: No skull fracture. No significant scalp soft tissue swelling. SINUSES/ORBITS: The mastoid air-cells are clear. The included paranasal sinuses are well-aerated.The included ocular globes and orbital contents are non-suspicious. Bilateral lens replacements. OTHER: None. CT CERVICAL SPINE FINDINGS Alignment: Maintained cervical lordosis. Intact craniocervical relationship. Osteoarthritis of the atlantodental interval with joint space narrowing, sclerosis and subcortical cystic change. Skull base and vertebrae: No acute fracture. No primary bone lesion or focal pathologic process. Soft tissues and spinal canal: No prevertebral fluid or swelling. No visible canal hematoma. Disc levels: Moderate disc space narrowing at C6-7 with small posterior marginal osteophytes. No canal stenosis. Mild to moderate foraminal stenosis on the right at C3-4, on the left at C4-5 and bilaterally at C6-7. Uncovertebral joint osteoarthritic Ankylosed C2-3 facets bilaterally. Multilevel degenerative facet arthropathy. Upper chest: Scarring at the apices. Other: Extracranial carotid arteriosclerosis bilaterally. No thyroid mass or thyromegaly. Port catheter partially visualized along the anterior chest wall. IMPRESSION: 1. Atrophy with chronic small vessel ischemia. No acute intracranial abnormality. 2. Cervical spondylosis without acute cervical spine fracture. Electronically Signed   By: [place] Royalty M.D.   On:  12/19/2017 23:27   Ct Cervical Spine Wo Contrast  Result Date: 12/19/2017 CLINICAL DATA:  Patient fell this evening.  Trauma to head and neck. EXAM: CT HEAD WITHOUT CONTRAST CT CERVICAL SPINE WITHOUT CONTRAST TECHNIQUE: Multidetector CT imaging of the head and cervical spine was performed following the standard protocol without intravenous contrast. Multiplanar CT image reconstructions of the cervical spine were also generated. COMPARISON:  None. FINDINGS: CT HEAD FINDINGS BRAIN: There is sulcal and ventricular prominence consistent with superficial and central atrophy. No intraparenchymal hemorrhage, mass effect nor midline shift. Periventricular and subcortical white matter hypodensities consistent with chronic small vessel ischemic disease are identified. No acute large vascular territory infarcts. No abnormal extra-axial fluid collections. Basal cisterns are not effaced and midline. VASCULAR: Moderate calcific atherosclerosis of the carotid siphons. SKULL: No skull fracture. No significant scalp soft tissue swelling. SINUSES/ORBITS: The mastoid air-cells are clear. The included paranasal sinuses are well-aerated.The included ocular globes and orbital contents are non-suspicious. Bilateral lens replacements. OTHER: None. CT CERVICAL SPINE FINDINGS Alignment: Maintained cervical lordosis. Intact craniocervical relationship. Osteoarthritis of the atlantodental interval with joint space narrowing, sclerosis and subcortical cystic change. Skull base and vertebrae: No acute fracture. No primary bone lesion or focal pathologic process. Soft tissues and spinal canal: No prevertebral fluid or swelling. No visible canal hematoma. Disc levels: Moderate disc space narrowing at C6-7 with small posterior marginal osteophytes. No canal stenosis. Mild to moderate foraminal stenosis on the right at C3-4, on the left at C4-5 and bilaterally at C6-7. Uncovertebral joint osteoarthritic Ankylosed C2-3 facets bilaterally.  Multilevel degenerative facet arthropathy. Upper chest: Scarring at the apices. Other: Extracranial carotid arteriosclerosis bilaterally. No thyroid mass or thyromegaly. Port catheter partially visualized along the anterior chest wall. IMPRESSION: 1. Atrophy with chronic small vessel ischemia. No acute intracranial abnormality. 2. Cervical spondylosis without acute cervical spine fracture. Electronically Signed   By: [place] Royalty M.D.   On: 12/19/2017 23:27   US Venous Img Lower Unilateral Left  Result Date: 12/10/2017 CLINICAL DATA:  Left lower extremity pain and edema. History of pancreatic cancer. Evaluate for DVT. EXAM: LEFT LOWER EXTREMITY VENOUS DOPPLER ULTRASOUND TECHNIQUE: Gray-scale sonography with graded compression, as well as color Doppler and duplex ultrasound were performed to evaluate the lower extremity  deep venous systems from the level of the common femoral vein and including the common femoral, femoral, profunda femoral, popliteal and calf veins including the posterior tibial, peroneal and gastrocnemius veins when visible. The superficial great saphenous vein was also interrogated. Spectral Doppler was utilized to evaluate flow at rest and with distal augmentation maneuvers in the common femoral, femoral and popliteal veins. COMPARISON:  None. FINDINGS: Contralateral Common Femoral Vein: Respiratory phasicity is normal and symmetric with the symptomatic side. No evidence of thrombus. Normal compressibility. Common Femoral Vein: No evidence of thrombus. Normal compressibility, respiratory phasicity and response to augmentation. Saphenofemoral Junction: No evidence of thrombus. Normal compressibility and flow on color Doppler imaging. Profunda Femoral Vein: No evidence of thrombus. Normal compressibility and flow on color Doppler imaging. Femoral Vein: No evidence of thrombus. Normal compressibility, respiratory phasicity and response to augmentation. Popliteal Vein: There is hypoechoic expansile  occlusive thrombus within the left popliteal vein (images 29 through 32). Calf Veins: No evidence of thrombus. Normal compressibility and flow on color Doppler imaging. Superficial Great Saphenous Vein: No evidence of thrombus. Normal compressibility. Other Findings:  None. IMPRESSION: Examination is positive for short segment occlusive DVT involving the left popliteal vein. Electronically Signed   By: Sandi Mariscal M.D.   On: 12/10/2017 16:22   Dg Hip Unilat  With Pelvis 2-3 Views Right  Result Date: 12/19/2017 CLINICAL DATA:  Found down at care facility. EXAM: DG HIP (WITH OR WITHOUT PELVIS) 2-3V RIGHT COMPARISON:  None. FINDINGS: Acute comminuted RIGHT femur intertrochanteric fracture with impaction, varus angulation of the distal bony fragments. No dislocation. Osteopenia without destructive bony lesions. Surgical clips project in the pelvis. Surgical coil material LEFT pelvis most compatible with herniorrhaphy. IMPRESSION: Acute displaced RIGHT femur intertrochanteric fracture. No dislocation. Electronically Signed   By: Elon Alas M.D.   On: 12/19/2017 23:05    ASSESSMENT: Stage IIB pancreatic adenocarcinoma  PLAN:    1.  Stage IIB pancreatic adenocarcinoma: MRI and MRCP results reviewed independently.  Patient recently had EUS at [facility] that confirmed adenocarcinoma.  His Ca 19-9 trended up slightly, but I suspect this may be as a result of manipulation from his bile duct after his recent stent replacement.  Today's result is pending. Patient is not a surgical candidate.  PET scan results from October 20, 2017 reviewed independently confirming stage of disease.  Will discontinue gemcitabine secondary to rash and proceed with cycle 2, day 8 of Abraxane only.  Return to clinic in 1 week for further evaluation by the nurse practitioner and consideration of cycle 2, day 15.  Patient will then return to clinic in 3 weeks for consideration of cycle 3, day 15. 2.  Elevated liver enzymes:  Resolved.  Patient had a permanent stent replacement on November 17, 2017. 3.  Nausea: Continue Zofran and compazine as prescribed. 4.  Constipation: Patient does not complain of this today. 5.  Elevated bilirubin: Resolved.  Stent replacement as above.  Monitor. 6.  Rash: Resolved.  Gemcitabine has been discontinued.  7.  Poor appetite: Improved.  Continue Megace.  Appreciate dietary input. 8.  Anxiety/sleep: Patient has been instructed to discontinue trazodone and was given a prescription for Xanax 0.25 mg at bedtime.   Patient expressed understanding and was in agreement with this plan. He also understands that He can call clinic at any time with any questions, concerns, or complaints.   Cancer Staging Malignant neoplasm of [facility]) Staging form: Exocrine Pancreas, AJCC 8th Edition - Clinical stage from 10/16/2017:  Stage IIB (cT3, cN1, cM0) - Signed by Lloyd Huger, MD on 10/25/2017   Lloyd Huger, MD   12/20/2017 11:08 PM

## 2017-12-20 NOTE — Progress Notes (Signed)
Pt is agitated, oriented to self, hollering and restless. [name] family at bedside requested for his medicines to be given early. Per pt's daughter, "I want him to get settled before we leave". [name] due bedtime medicines given, side rails up and bed alarm on for safety. Will continue to closely monitor.

## 2017-12-20 NOTE — Consult Note (Signed)
ORTHOPAEDIC CONSULTATION  PATIENT NAME: Gerald Alvarez. DOB: 1926/02/20  MRN: [id number]  REQUESTING PHYSICIAN: Fritzi Mandes, MD  Chief Complaint: right hip intratrochanteric fracture  HPI: Gerald Alvarez. is a 84 y.o. male who complains of  right hip pain after an unwitnessed fall at the nursing home. Patient has a complicated past medical history. Patient was diagnosed with stage II pancreatic cancer in January 2019. Patient has received 6 rounds of chemotherapy. His last round was a week ago. Patient has had few visits since his chemotherapy He was seen in the ER last week and was diagnosed with deep venous thrombosis and started on Eliquis. Patient was also diagnosed with cellulitis office left foot. Patient has also been diagnosed with neutropenia after chemotherapy. Patient has been admitted to the hospitalist service through the ER.  Past Medical History:  Diagnosis Date  . BPH (benign prostatic hyperplasia)   . Closed fracture of right hip ([place])   . Diabetes mellitus without complication ([place])   . Incontinence   . Nocturia   . OAB (overactive bladder)   . Pancreatic mass   . Prostate cancer [facility])    Past Surgical History:  Procedure Laterality Date  . CATARACT EXTRACTION, BILATERAL Bilateral   . CHOLECYSTECTOMY    . ENDOSCOPIC RETROGRADE CHOLANGIOPANCREATOGRAPHY (ERCP) WITH PROPOFOL N/A 09/27/2017   Procedure: ENDOSCOPIC RETROGRADE CHOLANGIOPANCREATOGRAPHY (ERCP) WITH PROPOFOL;  Surgeon: Lucilla Lame, MD;  Location: [facility] ENDOSCOPY;  Service: Endoscopy;  Laterality: N/A;  . ERCP N/A 11/17/2017   Procedure: ENDOSCOPIC RETROGRADE CHOLANGIOPANCREATOGRAPHY (ERCP);  Surgeon: Lucilla Lame, MD;  Location: [facility] ENDOSCOPY;  Service: Endoscopy;  Laterality: N/A;  . HERNIA REPAIR     x 3  . PORTA CATH INSERTION N/A 10/22/2017   Procedure: PORTA CATH INSERTION;  Surgeon: Algernon Huxley, MD;  Location: [place] [facility];  Service: Cardiovascular;  Laterality: N/A;  . PROSTATECTOMY      Social History   Socioeconomic History  . Marital status: Married    Spouse name: Not on file  . Number of children: Not on file  . Years of education: Not on file  . Highest education level: Not on file  Occupational History  . Not on file  Social Needs  . Financial resource strain: Not on file  . Food insecurity:    Worry: Not on file    Inability: Not on file  . Transportation needs:    Medical: Not on file    Non-medical: Not on file  Tobacco Use  . Smoking status: Former Smoker    Last attempt to quit: 03/15/1967    Years since quitting: 50.8  . Smokeless tobacco: Former Systems developer    Types: Chew  . Tobacco comment: quit long time  Substance and Sexual Activity  . Alcohol use: No    Alcohol/week: 0.0 oz  . Drug use: No  . Sexual activity: Never  Lifestyle  . Physical activity:    Days per week: Not on file    Minutes per session: Not on file  . Stress: Not on file  Relationships  . Social connections:    Talks on phone: Not on file    Gets together: Not on file    Attends religious service: Not on file    Active member of club or organization: Not on file    Attends meetings of clubs or organizations: Not on file    Relationship status: Not on file  Other Topics Concern  . Not on file  Social History  Narrative  . Not on file   Family History  Problem Relation Age of Onset  . Prostate cancer Brother   . Bladder Cancer Brother   . Pancreatic cancer Brother   . Prostate cancer Brother   . Lymphoma Mother   . Leukemia Father   . Breast cancer Sister   . Uterine cancer Sister   . Kidney cancer Neg Hx    Allergies  Allergen Reactions  . Sulfa Antibiotics Nausea And Vomiting   Prior to Admission medications   Medication Sig Start Date End Date Taking? Authorizing Provider  acetaminophen (TYLENOL) 325 MG tablet Take 2 tablets (650 mg total) by mouth every 6 (six) hours as needed for mild pain (or Fever >/= 101). 12/14/17  Yes Gladstone Lighter, MD   ALPRAZolam Duanne Moron) 0.25 MG tablet Take 1 tablet (0.25 mg total) by mouth at bedtime as needed for anxiety. 12/01/17  Yes Lloyd Huger, MD  amoxicillin-clavulanate (AUGMENTIN) 875-125 MG tablet Take 1 tablet by mouth every 12 (twelve) hours for 10 days. 12/14/17 12/24/17 Yes Gladstone Lighter, MD  apixaban (ELIQUIS) 5 MG TABS tablet Take 2 tablets twice a a day until 12/18/17 and then 1 tablet twice a day after that 12/14/17  Yes Gladstone Lighter, MD  bacitracin ointment Apply topically 2 (two) times daily. Apply to the blistered area on the medial left ankle once or twice a day 12/14/17  Yes Gladstone Lighter, MD  Cholecalciferol (VITAMIN D3) 2000 units capsule Take 2,000 Units by mouth daily.  03/05/17  Yes [provider]  doxazosin (CARDURA) 2 MG tablet Take 2 mg by mouth daily.  12/31/16 12/31/17 Yes [provider]  DULoxetine (CYMBALTA) 20 MG capsule Take 1 capsule by mouth daily. 09/12/17  Yes [provider]  HYDROcodone-acetaminophen (NORCO/VICODIN) 5-325 MG tablet Take 1 tablet by mouth every 6 (six) hours as needed for moderate pain or severe pain. 12/14/17  Yes Gladstone Lighter, MD  hydrOXYzine (VISTARIL) 25 MG capsule Take 12.5 mg by mouth 3 (three) times daily as needed for itching.   Yes [provider]  LEVEMIR FLEXTOUCH 100 UNIT/ML Pen Inject 5 Units into the skin 2 (two) times daily. 12/14/17  Yes Gladstone Lighter, MD  magnesium hydroxide (MILK OF MAGNESIA) 400 MG/5ML suspension Take 30 mLs by mouth daily.   Yes [provider]  megestrol (MEGACE) 40 MG tablet Take 40 mg by mouth daily. 10/30/17  Yes [provider]  Multiple Vitamin (MULTI-VITAMINS) TABS Take 1 tablet by mouth daily.    Yes [provider]  ondansetron (ZOFRAN) 4 MG tablet Take 1 tablet (4 mg total) by mouth every 8 (eight) hours as needed for nausea or vomiting. 10/27/17  Yes Lloyd Huger, MD  pantoprazole (PROTONIX) 40 MG tablet TAKE 1 TABLET TWICE  A DAY 02/08/15  Yes [provider]  polyethylene glycol (MIRALAX / GLYCOLAX) packet Take 17 g by mouth daily as needed for mild constipation. 12/14/17  Yes Gladstone Lighter, MD  prochlorperazine (COMPAZINE) 10 MG tablet Take 1 tablet (10 mg total) by mouth every 6 (six) hours as needed (Nausea or vomiting). 10/16/17  Yes Lloyd Huger, MD  protein supplement shake (PREMIER PROTEIN) LIQD Take 325 mLs (11 oz total) by mouth 2 (two) times daily between meals. 09/28/17  Yes Wieting, Richard, MD  senna-docusate (SENOKOT-S) 8.6-50 MG tablet Take 1 tablet by mouth 2 (two) times daily.   Yes [provider]  sertraline (ZOLOFT) 50 MG tablet Take 1 tablet by mouth daily. 07/14/17  Yes [provider]  spironolactone (ALDACTONE) 25 MG tablet Take 25 mg by mouth daily.   Yes [provider]  traZODone (DESYREL) 50 MG tablet Take 50 mg by mouth at bedtime.  12/29/16  Yes [provider]  triamcinolone ointment (KENALOG) 0.5 % Apply 1 application topically 2 (two) times daily. Do not apply to the blister part on the left ankle 12/14/17  Yes Gladstone Lighter, MD  vitamin B-12 (CYANOCOBALAMIN) 1000 MCG tablet Take 1,000 mcg by mouth.    Yes [provider]   Dg Chest 1 View  Result Date: 12/19/2017 CLINICAL DATA:  Dementia patient found on floor. Presumed unwitnessed fall. EXAM: CHEST  1 VIEW COMPARISON:  12/10/2017 FINDINGS: Tip of the right chest port in the distal SVC. Unchanged heart size and mediastinal contours with aortic atherosclerosis and tortuosity. No focal airspace opacity, pulmonary edema or pleural effusion. No pneumothorax. Bones are under mineralized without acute osseous abnormality. IMPRESSION: 1. No acute chest finding. 2. Aortic tortuosity and atherosclerosis. Electronically Signed   By: Jeb Levering M.D.   On: 12/19/2017 23:05   Ct Head Wo Contrast  Result Date: 12/19/2017 CLINICAL DATA:  Patient fell this evening.  Trauma to head and  neck. EXAM: CT HEAD WITHOUT CONTRAST CT CERVICAL SPINE WITHOUT CONTRAST TECHNIQUE: Multidetector CT imaging of the head and cervical spine was performed following the standard protocol without intravenous contrast. Multiplanar CT image reconstructions of the cervical spine were also generated. COMPARISON:  None. FINDINGS: CT HEAD FINDINGS BRAIN: There is sulcal and ventricular prominence consistent with superficial and central atrophy. No intraparenchymal hemorrhage, mass effect nor midline shift. Periventricular and subcortical white matter hypodensities consistent with chronic small vessel ischemic disease are identified. No acute large vascular territory infarcts. No abnormal extra-axial fluid collections. Basal cisterns are not effaced and midline. VASCULAR: Moderate calcific atherosclerosis of the carotid siphons. SKULL: No skull fracture. No significant scalp soft tissue swelling. SINUSES/ORBITS: The mastoid air-cells are clear. The included paranasal sinuses are well-aerated.The included ocular globes and orbital contents are non-suspicious. Bilateral lens replacements. OTHER: None. CT CERVICAL SPINE FINDINGS Alignment: Maintained cervical lordosis. Intact craniocervical relationship. Osteoarthritis of the atlantodental interval with joint space narrowing, sclerosis and subcortical cystic change. Skull base and vertebrae: No acute fracture. No primary bone lesion or focal pathologic process. Soft tissues and spinal canal: No prevertebral fluid or swelling. No visible canal hematoma. Disc levels: Moderate disc space narrowing at C6-7 with small posterior marginal osteophytes. No canal stenosis. Mild to moderate foraminal stenosis on the right at C3-4, on the left at C4-5 and bilaterally at C6-7. Uncovertebral joint osteoarthritic Ankylosed C2-3 facets bilaterally. Multilevel degenerative facet arthropathy. Upper chest: Scarring at the apices. Other: Extracranial carotid arteriosclerosis bilaterally. No thyroid  mass or thyromegaly. Port catheter partially visualized along the anterior chest wall. IMPRESSION: 1. Atrophy with chronic small vessel ischemia. No acute intracranial abnormality. 2. Cervical spondylosis without acute cervical spine fracture. Electronically Signed   By: Ashley Royalty M.D.   On: 12/19/2017 23:27   Ct Cervical Spine Wo Contrast  Result Date: 12/19/2017 CLINICAL DATA:  Patient fell this evening.  Trauma to head and neck. EXAM: CT HEAD WITHOUT CONTRAST CT CERVICAL SPINE WITHOUT CONTRAST TECHNIQUE: Multidetector CT imaging of the head and cervical spine was performed following the standard protocol without intravenous contrast. Multiplanar CT image reconstructions of the cervical spine were also generated. COMPARISON:  None. FINDINGS: CT HEAD FINDINGS BRAIN: There is sulcal and ventricular prominence consistent with superficial and central atrophy. No  intraparenchymal hemorrhage, mass effect nor midline shift. Periventricular and subcortical white matter hypodensities consistent with chronic small vessel ischemic disease are identified. No acute large vascular territory infarcts. No abnormal extra-axial fluid collections. Basal cisterns are not effaced and midline. VASCULAR: Moderate calcific atherosclerosis of the carotid siphons. SKULL: No skull fracture. No significant scalp soft tissue swelling. SINUSES/ORBITS: The mastoid air-cells are clear. The included paranasal sinuses are well-aerated.The included ocular globes and orbital contents are non-suspicious. Bilateral lens replacements. OTHER: None. CT CERVICAL SPINE FINDINGS Alignment: Maintained cervical lordosis. Intact craniocervical relationship. Osteoarthritis of the atlantodental interval with joint space narrowing, sclerosis and subcortical cystic change. Skull base and vertebrae: No acute fracture. No primary bone lesion or focal pathologic process. Soft tissues and spinal canal: No prevertebral fluid or swelling. No visible canal  hematoma. Disc levels: Moderate disc space narrowing at C6-7 with small posterior marginal osteophytes. No canal stenosis. Mild to moderate foraminal stenosis on the right at C3-4, on the left at C4-5 and bilaterally at C6-7. Uncovertebral joint osteoarthritic Ankylosed C2-3 facets bilaterally. Multilevel degenerative facet arthropathy. Upper chest: Scarring at the apices. Other: Extracranial carotid arteriosclerosis bilaterally. No thyroid mass or thyromegaly. Port catheter partially visualized along the anterior chest wall. IMPRESSION: 1. Atrophy with chronic small vessel ischemia. No acute intracranial abnormality. 2. Cervical spondylosis without acute cervical spine fracture. Electronically Signed   By: Ashley Royalty M.D.   On: 12/19/2017 23:27   Dg Hip Unilat  With Pelvis 2-3 Views Right  Result Date: 12/19/2017 CLINICAL DATA:  Found down at care facility. EXAM: DG HIP (WITH OR WITHOUT PELVIS) 2-3V RIGHT COMPARISON:  None. FINDINGS: Acute comminuted RIGHT femur intertrochanteric fracture with impaction, varus angulation of the distal bony fragments. No dislocation. Osteopenia without destructive bony lesions. Surgical clips project in the pelvis. Surgical coil material LEFT pelvis most compatible with herniorrhaphy. IMPRESSION: Acute displaced RIGHT femur intertrochanteric fracture. No dislocation. Electronically Signed   By: Elon Alas M.D.   On: 12/19/2017 23:05    Positive ROS: All other systems have been reviewed and were otherwise negative with the exception of those mentioned in the HPI and as above.  Physical Exam: General: Well developed, well nourished male complaining of right hip pain as well as left foot pain. HEENT: Atraumatic and normocephalic. Sclera are clear. Neck: Supple, nontender, good range of motion. No JVD or carotid bruits. Lungs: Clear to auscultation bilaterally. Cardiovascular: Regular rate and rhythm with normal S1 and S2. No murmurs. No gallops or rubs. Pedal  pulses are palpable bilaterally. Homans test is negative bilaterally. No significant pretibial or ankle edema. Abdomen: Soft, nontender, and nondistended. Skin: No lesions in the area of chief complaint Neurologic: Patient is currently not oriented in time and space. His motor strength is not tested as patient is not cooperative and following commands. No clonus or tremor. Lymphatic: No axillary or cervical lymphadenopathy  MUSCULOSKELETAL:  Right lower extremity is shortened and externally rotated. Patient has significant pain with palpation of his right hip. Patient has mild cellulitis and blistering around his left foot.  Assessment: 84 years old male with significant comorbidities including stage II pancreatic cancer status post rounds of chemotherapy and recent diagnosis of DVT currently on Eliquis. Patient has displaced right hip intratrochanteric fracture.  Plan: Displaced right hip intratrochanteric fracture. Patient has significant comorbidities including stage II pancreatic cancer status post rounds of chemotherapy and recent diagnosis of neutropenia and DVT. Patient is currently on Eliquis. Patient will need to be off anticoagulation at least for 48  hours before surgical intervention can be considered. Patient will need preop optimization from the hospitalist.   Creig Hines. M.D.

## 2017-12-20 NOTE — Progress Notes (Signed)
[facility] [place] NAME: Gerald Alvarez    MR#:  [id number]  DATE OF BIRTH:  November 17, 1925  SUBJECTIVE:  patient came in from assisted-living facility after he had all fall and started having hip pain. He is confused.  REVIEW OF SYSTEMS:   Review of Systems  Unable to perform ROS: Dementia   Tolerating Diet:yes Tolerating PT: pending  DRUG ALLERGIES:   Allergies  Allergen Reactions  . Sulfa Antibiotics Nausea And Vomiting    VITALS:  Blood pressure (!) 154/78, pulse 78, temperature 97.6 F (36.4 C), temperature source Oral, resp. rate 18, height 5\' 10"  (1.778 m), weight 70.8 kg (156 lb), SpO2 96 %.  PHYSICAL EXAMINATION:   Physical Exam  GENERAL:  84 y.o.-year-old patient lying in the bed with no acute distress.  EYES: Pupils equal, round, reactive to light and accommodation. No scleral icterus. Extraocular muscles intact.  HEENT: Head atraumatic, normocephalic. Oropharynx and nasopharynx clear.  NECK:  Supple, no jugular venous distention. No thyroid enlargement, no tenderness.  LUNGS: Normal breath sounds bilaterally, no wheezing, rales, rhonchi. No use of accessory muscles of respiration.  CARDIOVASCULAR: S1, S2 normal. No murmurs, rubs, or gallops.  ABDOMEN: Soft, nontender, nondistended. Bowel sounds present. No organomegaly or mass.  EXTREMITIES: No cyanosis, clubbing or edema b/l.    NEUROLOGIC: Cranial nerves II through XII are intact. No focal Motor or sensory deficits b/l.   PSYCHIATRIC:  patient is alert and oriented x 3.  SKIN: No obvious rash, lesion, or ulcer.   LABORATORY PANEL:  CBC Recent Labs  Lab 12/19/17 2210  WBC 4.6  HGB 10.7*  HCT 31.7*  PLT 281    Chemistries  Recent Labs  Lab 12/19/17 2210 12/20/17 0754  NA 135  --   K 3.3*  --   CL 110  --   CO2 22  --   GLUCOSE 181*  --   BUN 18  --   CREATININE 0.72  --   CALCIUM 8.2*  --   MG  --  2.1  AST 26  --   ALT 17  --   ALKPHOS 44   --   BILITOT 0.6  --    Cardiac Enzymes No results for input(s): TROPONINI in the last 168 hours. RADIOLOGY:  Dg Chest 1 View  Result Date: 12/19/2017 CLINICAL DATA:  Dementia patient found on floor. Presumed unwitnessed fall. EXAM: CHEST  1 VIEW COMPARISON:  12/10/2017 FINDINGS: Tip of the right chest port in the distal SVC. Unchanged heart size and mediastinal contours with aortic atherosclerosis and tortuosity. No focal airspace opacity, pulmonary edema or pleural effusion. No pneumothorax. Bones are under mineralized without acute osseous abnormality. IMPRESSION: 1. No acute chest finding. 2. Aortic tortuosity and atherosclerosis. Electronically Signed   By: Jeb Levering M.D.   On: 12/19/2017 23:05   Ct Head Wo Contrast  Result Date: 12/19/2017 CLINICAL DATA:  Patient fell this evening.  Trauma to head and neck. EXAM: CT HEAD WITHOUT CONTRAST CT CERVICAL SPINE WITHOUT CONTRAST TECHNIQUE: Multidetector CT imaging of the head and cervical spine was performed following the standard protocol without intravenous contrast. Multiplanar CT image reconstructions of the cervical spine were also generated. COMPARISON:  None. FINDINGS: CT HEAD FINDINGS BRAIN: There is sulcal and ventricular prominence consistent with superficial and central atrophy. No intraparenchymal hemorrhage, mass effect nor midline shift. Periventricular and subcortical white matter hypodensities consistent with chronic small vessel ischemic disease are identified. No acute large vascular  territory infarcts. No abnormal extra-axial fluid collections. Basal cisterns are not effaced and midline. VASCULAR: Moderate calcific atherosclerosis of the carotid siphons. SKULL: No skull fracture. No significant scalp soft tissue swelling. SINUSES/ORBITS: The mastoid air-cells are clear. The included paranasal sinuses are well-aerated.The included ocular globes and orbital contents are non-suspicious. Bilateral lens replacements. OTHER: None. CT  CERVICAL SPINE FINDINGS Alignment: Maintained cervical lordosis. Intact craniocervical relationship. Osteoarthritis of the atlantodental interval with joint space narrowing, sclerosis and subcortical cystic change. Skull base and vertebrae: No acute fracture. No primary bone lesion or focal pathologic process. Soft tissues and spinal canal: No prevertebral fluid or swelling. No visible canal hematoma. Disc levels: Moderate disc space narrowing at C6-7 with small posterior marginal osteophytes. No canal stenosis. Mild to moderate foraminal stenosis on the right at C3-4, on the left at C4-5 and bilaterally at C6-7. Uncovertebral joint osteoarthritic Ankylosed C2-3 facets bilaterally. Multilevel degenerative facet arthropathy. Upper chest: Scarring at the apices. Other: Extracranial carotid arteriosclerosis bilaterally. No thyroid mass or thyromegaly. Port catheter partially visualized along the anterior chest wall. IMPRESSION: 1. Atrophy with chronic small vessel ischemia. No acute intracranial abnormality. 2. Cervical spondylosis without acute cervical spine fracture. Electronically Signed   By: Ashley Royalty M.D.   On: 12/19/2017 23:27   Ct Cervical Spine Wo Contrast  Result Date: 12/19/2017 CLINICAL DATA:  Patient fell this evening.  Trauma to head and neck. EXAM: CT HEAD WITHOUT CONTRAST CT CERVICAL SPINE WITHOUT CONTRAST TECHNIQUE: Multidetector CT imaging of the head and cervical spine was performed following the standard protocol without intravenous contrast. Multiplanar CT image reconstructions of the cervical spine were also generated. COMPARISON:  None. FINDINGS: CT HEAD FINDINGS BRAIN: There is sulcal and ventricular prominence consistent with superficial and central atrophy. No intraparenchymal hemorrhage, mass effect nor midline shift. Periventricular and subcortical white matter hypodensities consistent with chronic small vessel ischemic disease are identified. No acute large vascular territory  infarcts. No abnormal extra-axial fluid collections. Basal cisterns are not effaced and midline. VASCULAR: Moderate calcific atherosclerosis of the carotid siphons. SKULL: No skull fracture. No significant scalp soft tissue swelling. SINUSES/ORBITS: The mastoid air-cells are clear. The included paranasal sinuses are well-aerated.The included ocular globes and orbital contents are non-suspicious. Bilateral lens replacements. OTHER: None. CT CERVICAL SPINE FINDINGS Alignment: Maintained cervical lordosis. Intact craniocervical relationship. Osteoarthritis of the atlantodental interval with joint space narrowing, sclerosis and subcortical cystic change. Skull base and vertebrae: No acute fracture. No primary bone lesion or focal pathologic process. Soft tissues and spinal canal: No prevertebral fluid or swelling. No visible canal hematoma. Disc levels: Moderate disc space narrowing at C6-7 with small posterior marginal osteophytes. No canal stenosis. Mild to moderate foraminal stenosis on the right at C3-4, on the left at C4-5 and bilaterally at C6-7. Uncovertebral joint osteoarthritic Ankylosed C2-3 facets bilaterally. Multilevel degenerative facet arthropathy. Upper chest: Scarring at the apices. Other: Extracranial carotid arteriosclerosis bilaterally. No thyroid mass or thyromegaly. Port catheter partially visualized along the anterior chest wall. IMPRESSION: 1. Atrophy with chronic small vessel ischemia. No acute intracranial abnormality. 2. Cervical spondylosis without acute cervical spine fracture. Electronically Signed   By: Ashley Royalty M.D.   On: 12/19/2017 23:27   Dg Hip Unilat  With Pelvis 2-3 Views Right  Result Date: 12/19/2017 CLINICAL DATA:  Found down at care facility. EXAM: DG HIP (WITH OR WITHOUT PELVIS) 2-3V RIGHT COMPARISON:  None. FINDINGS: Acute comminuted RIGHT femur intertrochanteric fracture with impaction, varus angulation of the distal bony fragments. No dislocation. Osteopenia without  destructive bony lesions. Surgical clips project in the pelvis. Surgical coil material LEFT pelvis most compatible with herniorrhaphy. IMPRESSION: Acute displaced RIGHT femur intertrochanteric fracture. No dislocation. Electronically Signed   By: Elon Alas M.D.   On: 12/19/2017 23:05   ASSESSMENT AND PLAN:  Gerald Alvarez  is a 84 y.o. 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  1.) Intertroch femur Fx: Pt p/w 1d Hx acute displaced R femur intertroch Fx w/o dislocation 2/2 fall OOB at rehab. Pt on Eliquis 2/2 recent LLE DVT, which has been held in anticipation of orthopedic repair on Monday 12/21/2017. Dr. Ian Malkin consulted from ED.   Pain ctrl, NWB RLE. -Patient has no underlying cardiac history per daughter [name]. Risks and complications for surgery explained to her daughter and she is agreeable to proceed. -Patient is at a low to intermediate risk for surgery. This was discussed with orthopedic  2.) LLE DVT: Hold Eliquis, as above. -Patient has short segment popliteal left DVT. Will resume eliquis as soon as possible after surgery once okay with orthopedic   3.) LLE cellulitis: c/w Augmentin (stop date 12/24/2017), topical Bacitracin + Triamcinolone. Wound consult. -Last dose for Augmentin is 12/24/17  4.) Hypokalemia: K+ 3.3. Replete and monitor.  5.) IDDM: Takes Levemir 5 BID.  -Will hold long-acting insulin from tomorrow since patient will be NPO for surgery. Continue sliding scale insulin  6.) Pancreatic Ca: patient on megestrol-- discussed with pharmacy given new DVT will discontinue megace  7.) BPH: c/w home Doxazosin.  8.) Anx/dep: c/w home Xanax, Cymbalta, Hydroxyzine, Zoloft.  9.) FEN/GI: Diabetic diet, NPO past midnight (0001AM, 12/21/2017) for procedure Monday  12/21/2017 AM. C/w home Protonix.  10.) DVT PPx: Holding full-dose therapeutic anticoagulation (Eliquis) for anticipated procedure Monday 12/21/2017 AM.  11.) Code Status: Full code per family.  D/w dters  Case discussed with Care Management/Social Worker. Management plans discussed with the patient, family and they are in agreement.  CODE STATUS: full  DVT Prophylaxis: TEDS and SCD  TOTAL TIME TAKING CARE OF THIS PATIENT: *30* minutes.  >50% time spent on counselling and coordination of care  POSSIBLE D/C IN *2-3* DAYS, DEPENDING ON CLINICAL CONDITION.  Note: This dictation was prepared with Dragon dictation along with smaller phrase technology. Any transcriptional errors that result from this process are unintentional.  Fritzi Mandes M.D on 12/20/2017 at 3:33 PM  Between 7am to 6pm - Pager - [phone]  After 6pm go to [email or web address] - Proofreader  [facility]  Office  [phone]  CC: Primary care physician; Rusty Aus, MDPatient ID: Gerald Angst., male   DOB: 1925/12/22, 84 y.o.   MRN: [id number]

## 2017-12-20 NOTE — ED Notes (Signed)
Floor states they are waiting for accurate bed to be placed in rm for pt, unable to transport pt at this time due to waiting on bed for room.

## 2017-12-20 NOTE — Progress Notes (Signed)
Pharmacy Electrolyte Monitoring Consult:  Pharmacy consulted to assist in monitoring and replacing electrolytes in this 84 y.o. male admitted on 12/19/2017 with Fall  Patient ordered potassium 66mEq PO x 1. Patient ordered spironolactone 25mg  PO Daily.   Labs:  Sodium (mmol/L)  Date Value  12/19/2017 135   Potassium (mmol/L)  Date Value  12/19/2017 3.3 (L)   Magnesium (mg/dL)  Date Value  12/20/2017 2.1   Calcium (mg/dL)  Date Value  12/19/2017 8.2 (L)   Albumin (g/dL)  Date Value  12/19/2017 2.8 (L)    Plan: Will order additional potassium 40mEq PO x 1. Will recheck bmp with am labs.   Pharmacy will continue to monitor and adjust per consult.    Simpson,Michael L 12/20/2017 9:29 AM

## 2017-12-20 NOTE — H&P (Signed)
[facility] NAME: Gerald Alvarez    MR#:  [id number]  DATE OF BIRTH:  03/14/1926  DATE OF ADMISSION:  12/19/2017  PRIMARY CARE PHYSICIAN: Rusty Aus, MD   REQUESTING/REFERRING PHYSICIAN: Lavonia Drafts, MD  CHIEF COMPLAINT:   Chief Complaint  Patient presents with  . Fall    HISTORY OF PRESENT ILLNESS:  Gerald Alvarez  is a 84 y.o. 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 Pt is resting after receiving pain medications in ED. Hx obtained from pt's daughter at bedside. Other family members are present as well. It is reported to me that the pt was recently hospitalized with a LLE DVT and LLE (ankle) cellulitis. He was admitted on 04/04 started on Eliquis within the last week, and was subsequently discharged to rehabilitation ([facility]). His daughter states that she and other family were visiting him at rehab on Saturday (12/19/2017), where they saw the nurses had bathed, clothed and put the patient in bed. They left @~2015PM. They received a phone call @~2100PM stating that the pt had tried to get himself out of bed, and had fallen and hurt himself. The patient himself was unable to remember why he had tried to get out of bed. XR R hip (+) acute displaced R femur intertrochanteric fracture w/o dislocation. The pt denied head injury, and CT head and CT C-spine done in ED do not demonstrate any acute changes.  The patient's daughter states the pt lives at home with his wife. He lives on a single floor, though they note there are several steps to get into the house. He ambulates with a cane. They state the pt was very independent and of sound mind up until ~1.5wks ago, when he was hospitalized w/ LLE DVT + LLE cellulitis. They note he has been intermittently confused and has  had difficulty with short-term recall since that time. They deny a history of dementia though. They state he has fallen 1-2x in the past, but he does not have a recent history of recurrent falls. The pt endorsed R hip pain in the ED, worse with movement and position change, but was otherwise without complaint. The pt did not complain of F/C/N/V/D/AP, CP/SOB, palpitations, diaphoresis, LH/LOC, urinary symptoms. [name] wife is DPOA, and his family states he desires to be full code.  Orthopedic surgery (Dr. Ian Malkin) contacted from ED. Pt on Eliquis, which has been held. Pt anticipated to have surgical correction of R hip Fx on Monday 12/21/2017.  PAST MEDICAL HISTORY:   Past Medical History:  Diagnosis Date  . BPH (benign prostatic hyperplasia)   . Closed fracture of right hip ([place])   . Diabetes mellitus without complication ([place])   . Incontinence   . Nocturia   . OAB (overactive bladder)   . Pancreatic mass   . Prostate cancer ([place])     PAST SURGICAL HISTORY:   Past Surgical History:  Procedure Laterality Date  . CATARACT EXTRACTION, BILATERAL Bilateral   . CHOLECYSTECTOMY    . ENDOSCOPIC RETROGRADE CHOLANGIOPANCREATOGRAPHY (ERCP) WITH PROPOFOL N/A 09/27/2017   Procedure: ENDOSCOPIC RETROGRADE CHOLANGIOPANCREATOGRAPHY (ERCP) WITH PROPOFOL;  Surgeon: Lucilla Lame, MD;  Location: [facility] ENDOSCOPY;  Service: Endoscopy;  Laterality: N/A;  . ERCP N/A 11/17/2017   Procedure: ENDOSCOPIC RETROGRADE CHOLANGIOPANCREATOGRAPHY (ERCP);  Surgeon: Lucilla Lame, MD;  Location: [facility] ENDOSCOPY;  Service: Endoscopy;  Laterality: N/A;  . HERNIA REPAIR     x 3  . PORTA CATH INSERTION N/A 10/22/2017   Procedure: PORTA CATH INSERTION;  Surgeon: Algernon Huxley, MD;  Location: [facility];  Service: Cardiovascular;  Laterality: N/A;  . PROSTATECTOMY      SOCIAL HISTORY:   Social History   Tobacco Use  . Smoking status: Former Smoker    Last attempt to quit: 03/15/1967    Years since quitting: 50.8  .  Smokeless tobacco: Former Systems developer    Types: Chew  . Tobacco comment: quit long time  Substance Use Topics  . Alcohol use: No    Alcohol/week: 0.0 oz    FAMILY HISTORY:   Family History  Problem Relation Age of Onset  . Prostate cancer Brother   . Bladder Cancer Brother   . Pancreatic cancer Brother   . Prostate cancer Brother   . Lymphoma Mother   . Leukemia Father   . Breast cancer Sister   . Uterine cancer Sister   . Kidney cancer Neg Hx     DRUG ALLERGIES:   Allergies  Allergen Reactions  . Sulfa Antibiotics Nausea And Vomiting    REVIEW OF SYSTEMS:   Review of Systems  Constitutional: Negative for chills, diaphoresis, fever, malaise/fatigue and weight loss.  HENT: Negative for congestion and sore throat.   Eyes: Negative for blurred vision, double vision and photophobia.  Respiratory: Negative for cough, hemoptysis, sputum production, shortness of breath and wheezing.   Cardiovascular: Negative for chest pain, palpitations, orthopnea, claudication and PND.  Gastrointestinal: Negative for abdominal pain, blood in stool, constipation, diarrhea, heartburn, melena, nausea and vomiting.  Genitourinary: Negative for dysuria, frequency, hematuria and urgency.  Musculoskeletal: Positive for falls and joint pain (+) R hip pain. Negative for back pain and neck pain.  Skin: Negative for itching and rash.  Neurological: Negative for dizziness, tingling, tremors, focal weakness, seizures, loss of consciousness, weakness and headaches.    MEDICATIONS AT HOME:   Prior to Admission medications   Medication Sig Start Date End Date Taking? Authorizing Provider  acetaminophen (TYLENOL) 325 MG tablet Take 2 tablets (650 mg total) by mouth every 6 (six) hours as needed for mild pain (or Fever >/= 101). 12/14/17  Yes Gladstone Lighter, MD  ALPRAZolam Duanne Moron) 0.25 MG tablet Take 1 tablet (0.25 mg total) by mouth at bedtime as needed for anxiety. 12/01/17  Yes Lloyd Huger, MD    amoxicillin-clavulanate (AUGMENTIN) 875-125 MG tablet Take 1 tablet by mouth every 12 (twelve) hours for 10 days. 12/14/17 12/24/17 Yes Gladstone Lighter, MD  apixaban (ELIQUIS) 5 MG TABS tablet Take 2 tablets twice a a day until 12/18/17 and then 1 tablet twice a day after that 12/14/17  Yes Gladstone Lighter, MD  bacitracin ointment Apply topically 2 (two) times daily. Apply to the blistered area on the medial left ankle once or twice a day 12/14/17  Yes Gladstone Lighter, MD  Cholecalciferol (VITAMIN D3) 2000 units capsule Take 2,000 Units by mouth daily.  03/05/17  Yes [provider]  doxazosin (CARDURA) 2 MG tablet Take 2 mg by mouth daily.  12/31/16 12/31/17 Yes [provider]  DULoxetine (CYMBALTA) 20 MG capsule Take 1 capsule by mouth daily. 09/12/17  Yes [provider]  HYDROcodone-acetaminophen (NORCO/VICODIN) 5-325 MG tablet Take 1 tablet by mouth every 6 (six) hours as needed for moderate pain or severe pain. 12/14/17  Yes Gladstone Lighter, MD  hydrOXYzine (  VISTARIL) 25 MG capsule Take 12.5 mg by mouth 3 (three) times daily as needed for itching.   Yes [provider]  LEVEMIR FLEXTOUCH 100 UNIT/ML Pen Inject 5 Units into the skin 2 (two) times daily. 12/14/17  Yes Gladstone Lighter, MD  magnesium hydroxide (MILK OF MAGNESIA) 400 MG/5ML suspension Take 30 mLs by mouth daily.   Yes [provider]  megestrol (MEGACE) 40 MG tablet Take 40 mg by mouth daily. 10/30/17  Yes [provider]  Multiple Vitamin (MULTI-VITAMINS) TABS Take 1 tablet by mouth daily.    Yes [provider]  ondansetron (ZOFRAN) 4 MG tablet Take 1 tablet (4 mg total) by mouth every 8 (eight) hours as needed for nausea or vomiting. 10/27/17  Yes Lloyd Huger, MD  pantoprazole (PROTONIX) 40 MG tablet TAKE 1 TABLET TWICE A DAY 02/08/15  Yes [provider]  polyethylene glycol (MIRALAX / GLYCOLAX) packet Take 17 g by mouth daily as needed for mild  constipation. 12/14/17  Yes Gladstone Lighter, MD  prochlorperazine (COMPAZINE) 10 MG tablet Take 1 tablet (10 mg total) by mouth every 6 (six) hours as needed (Nausea or vomiting). 10/16/17  Yes Lloyd Huger, MD  protein supplement shake (PREMIER PROTEIN) LIQD Take 325 mLs (11 oz total) by mouth 2 (two) times daily between meals. 09/28/17  Yes Wieting, Richard, MD  senna-docusate (SENOKOT-S) 8.6-50 MG tablet Take 1 tablet by mouth 2 (two) times daily.   Yes [provider]  sertraline (ZOLOFT) 50 MG tablet Take 1 tablet by mouth daily. 07/14/17  Yes [provider]  spironolactone (ALDACTONE) 25 MG tablet Take 25 mg by mouth daily.   Yes [provider]  traZODone (DESYREL) 50 MG tablet Take 50 mg by mouth at bedtime.  12/29/16  Yes [provider]  triamcinolone ointment (KENALOG) 0.5 % Apply 1 application topically 2 (two) times daily. Do not apply to the blister part on the left ankle 12/14/17  Yes Gladstone Lighter, MD  vitamin B-12 (CYANOCOBALAMIN) 1000 MCG tablet Take 1,000 mcg by mouth.    Yes [provider]      VITAL SIGNS:  Blood pressure (!) 142/78, pulse 75, temperature 98.6 F (37 C), resp. rate 19, height 5\' 10"  (1.778 m), weight 70.8 kg (156 lb), SpO2 97 %.  PHYSICAL EXAMINATION:  Physical Exam  Constitutional: He appears well-developed and well-nourished. He appears distressed (+) mild distress 2/2 pain.  HENT:  Head: Normocephalic and atraumatic.  Eyes: EOM are normal. No scleral icterus.  Neck: Normal range of motion. Neck supple. No JVD present. No thyromegaly present.  Cardiovascular: Normal rate, regular rhythm and normal heart sounds. Exam reveals no gallop and no friction rub.  No murmur heard. Pulmonary/Chest: Effort normal and breath sounds normal. No stridor. No respiratory distress. He has no wheezes. He has no rales.  (+) portacath  Abdominal: Soft. Bowel sounds are normal. He exhibits no distension and no mass.  There is no tenderness. There is no rebound and no guarding.  Musculoskeletal: He exhibits tenderness. He exhibits no edema.  R hip externally rotated, RLE immobile 2/2 pain.  Lymphadenopathy:    He has no cervical adenopathy.  Neurological:  RLE immobile 2/2 pain, no other gross deficits appreciated.  Skin: Skin is warm and dry. No rash noted. He is not diaphoretic. No erythema.  Vitals reviewed.   GENERAL:  84 y.o.-year-old patient lying in the bed with no acute distress.  EYES: Pupils equal, round, reactive to light and accommodation.  No scleral icterus. Extraocular muscles intact.  HEENT: Head atraumatic, normocephalic. Oropharynx and nasopharynx clear.  NECK:  Supple, no jugular venous distention. No thyroid enlargement, no tenderness.  LUNGS: Normal breath sounds bilaterally, no wheezing, rales,rhonchi or crepitation. No use of accessory muscles of respiration.  CARDIOVASCULAR: S1, S2 normal. No murmurs, rubs, or gallops.  ABDOMEN: Soft, nontender, nondistended. Bowel sounds present. No organomegaly or mass.  EXTREMITIES: No pedal edema, cyanosis, or clubbing.  NEUROLOGIC: Cranial nerves II through XII are intact. Muscle strength 5/5 in all extremities. Sensation intact. Gait not checked.  PSYCHIATRIC: The patient is alert and oriented x 3.  SKIN: No obvious rash, lesion, or ulcer.   LABORATORY PANEL:   CBC Recent Labs  Lab 12/19/17 2210  WBC 4.6  HGB 10.7*  HCT 31.7*  PLT 281   ------------------------------------------------------------------------------------------------------------------  Chemistries  Recent Labs  Lab 12/19/17 2210  NA 135  K 3.3*  CL 110  CO2 22  GLUCOSE 181*  BUN 18  CREATININE 0.72  CALCIUM 8.2*  AST 26  ALT 17  ALKPHOS 44  BILITOT 0.6   ------------------------------------------------------------------------------------------------------------------  Cardiac Enzymes No results for input(s): TROPONINI in the last 168  hours. ------------------------------------------------------------------------------------------------------------------  RADIOLOGY:  Dg Chest 1 View  Result Date: 12/19/2017 CLINICAL DATA:  Dementia patient found on floor. Presumed unwitnessed fall. EXAM: CHEST  1 VIEW COMPARISON:  12/10/2017 FINDINGS: Tip of the right chest port in the distal SVC. Unchanged heart size and mediastinal contours with aortic atherosclerosis and tortuosity. No focal airspace opacity, pulmonary edema or pleural effusion. No pneumothorax. Bones are under mineralized without acute osseous abnormality. IMPRESSION: 1. No acute chest finding. 2. Aortic tortuosity and atherosclerosis. Electronically Signed   By: Jeb Levering M.D.   On: 12/19/2017 23:05   Ct Head Wo Contrast  Result Date: 12/19/2017 CLINICAL DATA:  Patient fell this evening.  Trauma to head and neck. EXAM: CT HEAD WITHOUT CONTRAST CT CERVICAL SPINE WITHOUT CONTRAST TECHNIQUE: Multidetector CT imaging of the head and cervical spine was performed following the standard protocol without intravenous contrast. Multiplanar CT image reconstructions of the cervical spine were also generated. COMPARISON:  None. FINDINGS: CT HEAD FINDINGS BRAIN: There is sulcal and ventricular prominence consistent with superficial and central atrophy. No intraparenchymal hemorrhage, mass effect nor midline shift. Periventricular and subcortical white matter hypodensities consistent with chronic small vessel ischemic disease are identified. No acute large vascular territory infarcts. No abnormal extra-axial fluid collections. Basal cisterns are not effaced and midline. VASCULAR: Moderate calcific atherosclerosis of the carotid siphons. SKULL: No skull fracture. No significant scalp soft tissue swelling. SINUSES/ORBITS: The mastoid air-cells are clear. The included paranasal sinuses are well-aerated.The included ocular globes and orbital contents are non-suspicious. Bilateral lens  replacements. OTHER: None. CT CERVICAL SPINE FINDINGS Alignment: Maintained cervical lordosis. Intact craniocervical relationship. Osteoarthritis of the atlantodental interval with joint space narrowing, sclerosis and subcortical cystic change. Skull base and vertebrae: No acute fracture. No primary bone lesion or focal pathologic process. Soft tissues and spinal canal: No prevertebral fluid or swelling. No visible canal hematoma. Disc levels: Moderate disc space narrowing at C6-7 with small posterior marginal osteophytes. No canal stenosis. Mild to moderate foraminal stenosis on the right at C3-4, on the left at C4-5 and bilaterally at C6-7. Uncovertebral joint osteoarthritic Ankylosed C2-3 facets bilaterally. Multilevel degenerative facet arthropathy. Upper chest: Scarring at the apices. Other: Extracranial carotid arteriosclerosis bilaterally. No thyroid mass or thyromegaly. Port catheter partially visualized along the anterior chest wall. IMPRESSION: 1. Atrophy with chronic small  vessel ischemia. No acute intracranial abnormality. 2. Cervical spondylosis without acute cervical spine fracture. Electronically Signed   By: Ashley Royalty M.D.   On: 12/19/2017 23:27   Ct Cervical Spine Wo Contrast  Result Date: 12/19/2017 CLINICAL DATA:  Patient fell this evening.  Trauma to head and neck. EXAM: CT HEAD WITHOUT CONTRAST CT CERVICAL SPINE WITHOUT CONTRAST TECHNIQUE: Multidetector CT imaging of the head and cervical spine was performed following the standard protocol without intravenous contrast. Multiplanar CT image reconstructions of the cervical spine were also generated. COMPARISON:  None. FINDINGS: CT HEAD FINDINGS BRAIN: There is sulcal and ventricular prominence consistent with superficial and central atrophy. No intraparenchymal hemorrhage, mass effect nor midline shift. Periventricular and subcortical white matter hypodensities consistent with chronic small vessel ischemic disease are identified. No acute  large vascular territory infarcts. No abnormal extra-axial fluid collections. Basal cisterns are not effaced and midline. VASCULAR: Moderate calcific atherosclerosis of the carotid siphons. SKULL: No skull fracture. No significant scalp soft tissue swelling. SINUSES/ORBITS: The mastoid air-cells are clear. The included paranasal sinuses are well-aerated.The included ocular globes and orbital contents are non-suspicious. Bilateral lens replacements. OTHER: None. CT CERVICAL SPINE FINDINGS Alignment: Maintained cervical lordosis. Intact craniocervical relationship. Osteoarthritis of the atlantodental interval with joint space narrowing, sclerosis and subcortical cystic change. Skull base and vertebrae: No acute fracture. No primary bone lesion or focal pathologic process. Soft tissues and spinal canal: No prevertebral fluid or swelling. No visible canal hematoma. Disc levels: Moderate disc space narrowing at C6-7 with small posterior marginal osteophytes. No canal stenosis. Mild to moderate foraminal stenosis on the right at C3-4, on the left at C4-5 and bilaterally at C6-7. Uncovertebral joint osteoarthritic Ankylosed C2-3 facets bilaterally. Multilevel degenerative facet arthropathy. Upper chest: Scarring at the apices. Other: Extracranial carotid arteriosclerosis bilaterally. No thyroid mass or thyromegaly. Port catheter partially visualized along the anterior chest wall. IMPRESSION: 1. Atrophy with chronic small vessel ischemia. No acute intracranial abnormality. 2. Cervical spondylosis without acute cervical spine fracture. Electronically Signed   By: Ashley Royalty M.D.   On: 12/19/2017 23:27   Dg Hip Unilat  With Pelvis 2-3 Views Right  Result Date: 12/19/2017 CLINICAL DATA:  Found down at care facility. EXAM: DG HIP (WITH OR WITHOUT PELVIS) 2-3V RIGHT COMPARISON:  None. FINDINGS: Acute comminuted RIGHT femur intertrochanteric fracture with impaction, varus angulation of the distal bony fragments. No  dislocation. Osteopenia without destructive bony lesions. Surgical clips project in the pelvis. Surgical coil material LEFT pelvis most compatible with herniorrhaphy. IMPRESSION: Acute displaced RIGHT femur intertrochanteric fracture. No dislocation. Electronically Signed   By: Elon Alas M.D.   On: 12/19/2017 23:05      IMPRESSION AND PLAN:   A/P: 37M R intertroch femur Fx, on Eliquis for recent LLE DVT.  1.) Intertroch femur Fx: Pt p/w 1d Hx acute displaced R femur intertroch Fx w/o dislocation 2/2 fall OOB at rehab. Pt on Eliquis 2/2 recent LLE DVT, which has been held in anticipation of orthopedic repair on Monday 12/21/2017. Dr. Ian Malkin consulted from ED. Will confer with Ortho regarding anticoagulation. Pain ctrl, NWB RLE.  2.) LLE DVT: Hold Eliquis, as above.  3.) LLE cellulitis: c/w Augmentin (stop date 12/24/2017), topical Bacitracin + Triamcinolone. Wound consult.  4.) Hypokalemia: K+ 3.3. Mag level pending. Replete and monitor.  5.) IDDM: Takes Levemir 5 BID. Ordered for one-time dose on Sunday 04/14 AM, as pt expected to be NPO past midnight (0001AM, 12/21/2017) for procedure Monday 12/21/2017 AM. MSS[facility].  6.) Pancreatic Ca:  c/w home Megestrol.  7.) BPH: c/w home Doxazosin.  8.) Anx/dep: c/w home Xanax, Cymbalta, Hydroxyzine, Zoloft.  9.) FEN/GI: Diabetic diet, NPO past midnight (0001AM, 12/21/2017) for procedure Monday 12/21/2017 AM. C/w home Protonix.  10.) DVT PPx: Holding full-dose therapeutic anticoagulation (Eliquis) for anticipated procedure Monday 12/21/2017 AM. Will confer with Ortho regarding anticoagulation.  11.) Code Status: Full code per family.  12.) Disposition: Admission, pt expected to stay > 2 midnights.   All the records are reviewed and case discussed with ED provider. Management plans discussed with the patient, family and they are in agreement.  CODE STATUS: Full code.  TOTAL TIME TAKING CARE OF THIS PATIENT: 90 minutes.    Arta Silence M.D on 12/20/2017 at 3:01 AM  Between 7am to 6pm - Pager - [phone]  After 6pm go to [email or web address] - Proofreader  [facility]  Office  [phone]  CC: Primary care physician; Rusty Aus, MD   Note: This dictation was prepared with Dragon dictation along with smaller phrase technology. Any transcriptional errors that result from this process are unintentional.

## 2017-12-20 NOTE — Progress Notes (Signed)
Family Meeting Note  Advance Directive:yes  Today a meeting took place with the dters  The following were discussed:Patient's diagnosis: patient is admitted with close into trochanteric hip fracture. He has multiple comorbidities. Overall seems to be stable at present. Prognosis fair. Discussed with daughters. Patient is a full code. Orthopedic consultation placed  Additional follow-up to be provided: physical therapy after surgery  Time spent during discussion: 16 minutes  Fritzi Mandes, MD

## 2017-12-21 ENCOUNTER — Telehealth: Payer: Self-pay

## 2017-12-21 ENCOUNTER — Encounter: Payer: Self-pay | Admitting: Anesthesiology

## 2017-12-21 ENCOUNTER — Inpatient Hospital Stay: Payer: Medicare Other | Admitting: Anesthesiology

## 2017-12-21 ENCOUNTER — Inpatient Hospital Stay: Payer: Medicare Other

## 2017-12-21 ENCOUNTER — Encounter
Admission: EM | Disposition: A | Discharge status: Hospice | Payer: Self-pay | Source: Home / Self Care | Attending: Internal Medicine

## 2017-12-21 DIAGNOSIS — C259 Malignant neoplasm of pancreas, unspecified: Secondary | ICD-10-CM

## 2017-12-21 DIAGNOSIS — I82409 Acute embolism and thrombosis of unspecified deep veins of unspecified lower extremity: Secondary | ICD-10-CM

## 2017-12-21 DIAGNOSIS — E119 Type 2 diabetes mellitus without complications: Secondary | ICD-10-CM

## 2017-12-21 HISTORY — PX: INTRAMEDULLARY (IM) NAIL INTERTROCHANTERIC: SHX5875

## 2017-12-21 HISTORY — PX: IVC FILTER INSERTION: CATH118245

## 2017-12-21 LAB — GLUCOSE, CAPILLARY
GLUCOSE-CAPILLARY: 154 mg/dL — AB (ref 65–99)
GLUCOSE-CAPILLARY: 150 mg/dL — AB (ref 65–99)
Glucose-Capillary: 155 mg/dL — ABNORMAL HIGH (ref 65–99)
Glucose-Capillary: 151 mg/dL — ABNORMAL HIGH (ref 65–99)

## 2017-12-21 SURGERY — FIXATION, FRACTURE, INTERTROCHANTERIC, WITH INTRAMEDULLARY ROD
Anesthesia: General | Site: Hip | Laterality: Right | Wound class: "Clean "

## 2017-12-21 SURGERY — IVC FILTER INSERTION
Anesthesia: Moderate Sedation

## 2017-12-21 MED ORDER — PHENYLEPHRINE HCL 10 MG/ML IJ SOLN
INTRAMUSCULAR | Status: AC
  Filled 2017-12-21: qty 1

## 2017-12-21 MED ORDER — ACETAMINOPHEN 500 MG PO TABS
1000.0000 mg | ORAL_TABLET | Freq: Three times a day (TID) | ORAL | Status: AC
  Administered 2017-12-21 – 2017-12-22 (×4): 1000 mg via ORAL
  Filled 2017-12-21 (×4): qty 2

## 2017-12-21 MED ORDER — HYDROMORPHONE HCL 1 MG/ML IJ SOLN: 0.2500 mg | INTRAMUSCULAR | Status: DC | PRN

## 2017-12-21 MED ORDER — FENTANYL CITRATE (PF) 100 MCG/2ML IJ SOLN
INTRAMUSCULAR | Status: AC
  Filled 2017-12-21: qty 2

## 2017-12-21 MED ORDER — EPHEDRINE SULFATE 50 MG/ML IJ SOLN
INTRAMUSCULAR | Status: AC
  Filled 2017-12-21: qty 1

## 2017-12-21 MED ORDER — MIDAZOLAM HCL 2 MG/2ML IJ SOLN
INTRAMUSCULAR | Status: DC | PRN
  Administered 2017-12-21: 1 mg via INTRAVENOUS

## 2017-12-21 MED ORDER — CEFAZOLIN SODIUM-DEXTROSE 1-4 GM/50ML-% IV SOLN
1.0000 g | Freq: Four times a day (QID) | INTRAVENOUS | Status: AC
  Administered 2017-12-21 – 2017-12-22 (×3): 1 g via INTRAVENOUS
  Filled 2017-12-21 (×3): qty 50

## 2017-12-21 MED ORDER — SODIUM CHLORIDE 0.9 % IV SOLN
INTRAVENOUS | Status: DC | PRN
  Administered 2017-12-21: 11:00:00 via INTRAVENOUS

## 2017-12-21 MED ORDER — APIXABAN 5 MG PO TABS
5.0000 mg | ORAL_TABLET | Freq: Two times a day (BID) | ORAL | Status: DC
  Administered 2017-12-22 – 2017-12-26 (×9): 5 mg via ORAL
  Filled 2017-12-21 (×9): qty 1

## 2017-12-21 MED ORDER — CEFAZOLIN SODIUM 1 G IJ SOLR
INTRAMUSCULAR | Status: AC
  Filled 2017-12-21: qty 10

## 2017-12-21 MED ORDER — SODIUM CHLORIDE 0.9 % IV SOLN
INTRAVENOUS | Status: DC
  Administered 2017-12-21: 09:00:00 via INTRAVENOUS

## 2017-12-21 MED ORDER — CEFAZOLIN SODIUM-DEXTROSE 1-4 GM/50ML-% IV SOLN
INTRAVENOUS | Status: DC | PRN
  Administered 2017-12-21: 1 g via INTRAVENOUS

## 2017-12-21 MED ORDER — ONDANSETRON HCL 4 MG/2ML IJ SOLN: 4.0000 mg | Freq: Once | INTRAMUSCULAR | Status: DC | PRN

## 2017-12-21 MED ORDER — ROCURONIUM BROMIDE 50 MG/5ML IV SOLN
INTRAVENOUS | Status: AC
  Filled 2017-12-21: qty 1

## 2017-12-21 MED ORDER — MIDAZOLAM HCL 5 MG/5ML IJ SOLN
INTRAMUSCULAR | Status: AC
  Filled 2017-12-21: qty 5

## 2017-12-21 MED ORDER — BUPIVACAINE-EPINEPHRINE (PF) 0.5% -1:200000 IJ SOLN
INTRAMUSCULAR | Status: AC
  Filled 2017-12-21: qty 30

## 2017-12-21 MED ORDER — METOCLOPRAMIDE HCL 10 MG PO TABS: 5.0000 mg | ORAL_TABLET | Freq: Three times a day (TID) | ORAL | Status: DC | PRN

## 2017-12-21 MED ORDER — BUPIVACAINE LIPOSOME 1.3 % IJ SUSP
INTRAMUSCULAR | Status: DC | PRN
  Administered 2017-12-21: 50 mL

## 2017-12-21 MED ORDER — METHOCARBAMOL 500 MG PO TABS
500.0000 mg | ORAL_TABLET | Freq: Four times a day (QID) | ORAL | Status: DC | PRN
Start: 2017-12-21 — End: 2017-12-26

## 2017-12-21 MED ORDER — LIDOCAINE HCL (CARDIAC) 20 MG/ML IV SOLN
INTRAVENOUS | Status: DC | PRN
  Administered 2017-12-21: 100 mg via INTRAVENOUS

## 2017-12-21 MED ORDER — CEFAZOLIN SODIUM-DEXTROSE 2-4 GM/100ML-% IV SOLN
2.0000 g | INTRAVENOUS | Status: DC
  Administered 2017-12-21: 2 g via INTRAVENOUS
  Filled 2017-12-21: qty 100

## 2017-12-21 MED ORDER — SUGAMMADEX SODIUM 200 MG/2ML IV SOLN
INTRAVENOUS | Status: DC | PRN
  Administered 2017-12-21: 141.6 mg via INTRAVENOUS

## 2017-12-21 MED ORDER — ROCURONIUM BROMIDE 100 MG/10ML IV SOLN
INTRAVENOUS | Status: DC | PRN
  Administered 2017-12-21: 40 mg via INTRAVENOUS
  Administered 2017-12-21 (×2): 10 mg via INTRAVENOUS

## 2017-12-21 MED ORDER — BUPIVACAINE LIPOSOME 1.3 % IJ SUSP
INTRAMUSCULAR | Status: AC
  Filled 2017-12-21: qty 20

## 2017-12-21 MED ORDER — BISACODYL 10 MG RE SUPP: 10.0000 mg | Freq: Every day | RECTAL | Status: DC | PRN

## 2017-12-21 MED ORDER — LIDOCAINE HCL (PF) 2 % IJ SOLN
INTRAMUSCULAR | Status: AC
  Filled 2017-12-21: qty 10

## 2017-12-21 MED ORDER — NEOMYCIN-POLYMYXIN B GU 40-200000 IR SOLN
Status: DC | PRN
  Administered 2017-12-21: 2 mL

## 2017-12-21 MED ORDER — PROPOFOL 10 MG/ML IV BOLUS
INTRAVENOUS | Status: DC | PRN
  Administered 2017-12-21: 100 mg via INTRAVENOUS

## 2017-12-21 MED ORDER — PHENYLEPHRINE HCL 10 MG/ML IJ SOLN
INTRAMUSCULAR | Status: DC | PRN
  Administered 2017-12-21 (×2): 100 ug via INTRAVENOUS

## 2017-12-21 MED ORDER — METOCLOPRAMIDE HCL 5 MG/ML IJ SOLN: 5.0000 mg | Freq: Three times a day (TID) | INTRAMUSCULAR | Status: DC | PRN

## 2017-12-21 MED ORDER — EPHEDRINE SULFATE 50 MG/ML IJ SOLN
INTRAMUSCULAR | Status: DC | PRN
  Administered 2017-12-21 (×2): 10 mg via INTRAVENOUS
  Administered 2017-12-21: 15 mg via INTRAVENOUS

## 2017-12-21 MED ORDER — PHENYLEPHRINE 8 MG IN D5W 100 ML (0.08MG/ML) PREMIX OPTIME
INJECTION | INTRAVENOUS | Status: DC | PRN
  Administered 2017-12-21: 30 ug/min via INTRAVENOUS

## 2017-12-21 MED ORDER — FLEET ENEMA 7-19 GM/118ML RE ENEM: 1.0000 | ENEMA | Freq: Once | RECTAL | Status: DC | PRN

## 2017-12-21 MED ORDER — SODIUM CHLORIDE 0.9 % IV SOLN
INTRAVENOUS | Status: DC
  Administered 2017-12-21: 17:00:00 via INTRAVENOUS
  Administered 2017-12-22: 75 mL/h via INTRAVENOUS

## 2017-12-21 MED ORDER — PROPOFOL 10 MG/ML IV BOLUS
INTRAVENOUS | Status: AC
  Filled 2017-12-21: qty 20

## 2017-12-21 MED ORDER — FENTANYL CITRATE (PF) 100 MCG/2ML IJ SOLN
INTRAMUSCULAR | Status: DC | PRN
  Administered 2017-12-21: 100 ug via INTRAVENOUS

## 2017-12-21 MED ORDER — ONDANSETRON HCL 4 MG PO TABS: 4.0000 mg | ORAL_TABLET | Freq: Four times a day (QID) | ORAL | Status: DC | PRN

## 2017-12-21 MED ORDER — OXYCODONE HCL 5 MG PO TABS
2.5000 mg | ORAL_TABLET | ORAL | Status: DC | PRN
  Filled 2017-12-21: qty 1

## 2017-12-21 MED ORDER — SENNOSIDES-DOCUSATE SODIUM 8.6-50 MG PO TABS: 1.0000 | ORAL_TABLET | Freq: Every evening | ORAL | Status: DC | PRN

## 2017-12-21 MED ORDER — LIDOCAINE-EPINEPHRINE (PF) 1 %-1:200000 IJ SOLN
INTRAMUSCULAR | Status: AC
  Filled 2017-12-21: qty 30

## 2017-12-21 MED ORDER — DOCUSATE SODIUM 100 MG PO CAPS
100.0000 mg | ORAL_CAPSULE | Freq: Two times a day (BID) | ORAL | Status: DC
  Administered 2017-12-22 – 2017-12-23 (×3): 100 mg via ORAL
  Filled 2017-12-21 (×4): qty 1

## 2017-12-21 MED ORDER — ONDANSETRON HCL 4 MG/2ML IJ SOLN: 4.0000 mg | Freq: Four times a day (QID) | INTRAMUSCULAR | Status: DC | PRN

## 2017-12-21 MED ORDER — METHOCARBAMOL 1000 MG/10ML IJ SOLN
500.0000 mg | Freq: Four times a day (QID) | INTRAVENOUS | Status: DC | PRN
  Filled 2017-12-21: qty 5

## 2017-12-21 MED ORDER — IOPAMIDOL (ISOVUE-300) INJECTION 61%
INTRAVENOUS | Status: DC | PRN
  Administered 2017-12-21: 20 mL via INTRAVENOUS

## 2017-12-21 MED ORDER — FENTANYL CITRATE (PF) 100 MCG/2ML IJ SOLN: 25.0000 ug | INTRAMUSCULAR | Status: DC | PRN

## 2017-12-21 MED ORDER — HEPARIN (PORCINE) IN NACL 2-0.9 UNIT/ML-% IJ SOLN
INTRAMUSCULAR | Status: AC
  Filled 2017-12-21: qty 500

## 2017-12-21 SURGICAL SUPPLY — 49 items
"PENCIL ELECTRO HAND CTR " (MISCELLANEOUS) ×1 IMPLANT
BIT DRILL AO GAMMA 4.2X180 (BIT) ×2 IMPLANT
BLADE SURG 15 STRL LF DISP TIS (BLADE) ×1 IMPLANT
BLADE SURG 15 STRL SS (BLADE) ×2
BNDG COHESIVE 6X5 TAN STRL LF (GAUZE/BANDAGES/DRESSINGS) ×3 IMPLANT
CANISTER SUCT 1200ML W/VALVE (MISCELLANEOUS) ×3 IMPLANT
CHLORAPREP W/TINT 26ML (MISCELLANEOUS) ×3 IMPLANT
DRAPE SHEET LG 3/4 BI-LAMINATE (DRAPES) ×3 IMPLANT
DRAPE SURG 17X11 SM STRL (DRAPES) ×6 IMPLANT
DRAPE U-SHAPE 47X51 STRL (DRAPES) ×3 IMPLANT
DRSG TEGADERM 4X4.75 (GAUZE/BANDAGES/DRESSINGS) ×13 IMPLANT
ELECT REM PT RETURN 9FT ADLT (ELECTROSURGICAL) ×3
ELECTRODE REM PT RTRN 9FT ADLT (ELECTROSURGICAL) ×1 IMPLANT
GAUZE SPONGE 4X4 12PLY STRL (GAUZE/BANDAGES/DRESSINGS) ×3 IMPLANT
GLOVE BIOGEL PI IND STRL 8 (GLOVE) ×1 IMPLANT
GLOVE BIOGEL PI INDICATOR 8 (GLOVE) ×2
GLOVE SURG SYN 7.5  E (GLOVE) ×2
GLOVE SURG SYN 7.5 E (GLOVE) ×1 IMPLANT
GLOVE SURG SYN 7.5 PF PI (GLOVE) ×1 IMPLANT
GOWN STRL REUS W/ TWL LRG LVL3 (GOWN DISPOSABLE) ×1 IMPLANT
GOWN STRL REUS W/ TWL XL LVL3 (GOWN DISPOSABLE) ×1 IMPLANT
GOWN STRL REUS W/TWL LRG LVL3 (GOWN DISPOSABLE) ×2
GOWN STRL REUS W/TWL XL LVL3 (GOWN DISPOSABLE) ×2
GUIDEROD T2 3X1000 (ROD) ×2 IMPLANT
GUIDEWIRE GAMMA (WIRE) ×2 IMPLANT
K-WIRE  3.2X450M STR (WIRE) ×2
K-WIRE 3.2X450M STR (WIRE) ×1
KIT PATIENT CARE HANA TABLE (KITS) ×3 IMPLANT
KIT TURNOVER KIT A (KITS) ×3 IMPLANT
KWIRE 3.2X450M STR (WIRE) IMPLANT
MAT BLUE FLOOR 46X72 FLO (MISCELLANEOUS) ×6 IMPLANT
NAIL GAMMA LONG (Nail) ×2 IMPLANT
NDL FILTER BLUNT 18X1 1/2 (NEEDLE) ×1 IMPLANT
NEEDLE FILTER BLUNT 18X 1/2SAF (NEEDLE) ×2
NEEDLE FILTER BLUNT 18X1 1/2 (NEEDLE) ×1 IMPLANT
NEEDLE HYPO 22GX1.5 SAFETY (NEEDLE) ×3 IMPLANT
NS IRRIG 1000ML POUR BTL (IV SOLUTION) ×3 IMPLANT
PACK HIP COMPR (MISCELLANEOUS) ×3 IMPLANT
PAD ABD DERMACEA PRESS 5X9 (GAUZE/BANDAGES/DRESSINGS) ×6 IMPLANT
PENCIL ELECTRO HAND CTR (MISCELLANEOUS) ×3 IMPLANT
REAMER SHAFT BIXCUT (INSTRUMENTS) ×2 IMPLANT
SCREW LAG GAMMA 3 TI 10.5X105M (Screw) ×2 IMPLANT
SCREW LOCKING FULL THREAD 5X52 (Screw) ×2 IMPLANT
SCREW LOCKING THREADED 5X47.5 (Screw) ×2 IMPLANT
STAPLER SKIN PROX 35W (STAPLE) ×3 IMPLANT
SUT VIC AB 2-0 CT2 27 (SUTURE) ×3 IMPLANT
SYR 10ML LL (SYRINGE) ×3 IMPLANT
SYR 30ML LL (SYRINGE) ×3 IMPLANT
TAPE CLOTH 3X10 WHT NS LF (GAUZE/BANDAGES/DRESSINGS) ×3 IMPLANT

## 2017-12-21 SURGICAL SUPPLY — 6 items
DEVICE SAFEGUARD 24CM (GAUZE/BANDAGES/DRESSINGS) ×3 IMPLANT
FILTER VC CELECT-FEMORAL (Filter) ×3 IMPLANT
PACK ANGIOGRAPHY (CUSTOM PROCEDURE TRAY) ×3 IMPLANT
SHEATH BRITE TIP 6FRX11 (SHEATH) ×3 IMPLANT
TOWEL OR 17X26 4PK STRL BLUE (TOWEL DISPOSABLE) ×3 IMPLANT
WIRE J 3MM .035X145CM (WIRE) ×3 IMPLANT

## 2017-12-21 NOTE — Consult Note (Addendum)
ORTHOPAEDIC CONSULTATION  REQUESTING PHYSICIAN: Max Sane, MD  Chief Complaint:   R hip pain  History of Present Illness: History of obtained from family at bedside.   Gerald Alvarez. is a 84 y.o. male with stage IIa pancreatic cancer on chemotherapy, pancytopenia, a history of prostate cancer s/p surgery & XRT, new onset diabetes after diagnosis of pancreatic cancer who was admitted from ED after discovery of R intertrochanteric hip fracture. Pain is currently described as severe. It is located in anterior thigh. Pain is worse with any movement of RLE and alleviated with rest.   Of note, he was admitted on 12/10/17 with a LLE DVT and LLE (ankle) cellulitis. He was started on Eliquis and was discharged to SNF.  Family was with him on 12/19/17 at facility, but soon after they left, they received a call stating the patient had fallen and hurt himself. He was brought the ED where the remainder of workup was negative.   Prior to admission on 12/10/17, the patient was living independently at home with his wife. He was very mobile and would use a cane for prolonged ambulation. Additionally, his mental status was relatively normal, but did have some short-term memory less prior to 12/10/17 admission. Since that time, he has not been mobile except for PT and has had worsening signs of dementia.   He is currently full code, but the family will soon undergo a discussion of goals of care and revisit DNR status.   Eliquis has been held since 12/19/17.    Past Medical History:  Diagnosis Date  . BPH (benign prostatic hyperplasia)   . Closed fracture of right hip ([place])   . Diabetes mellitus without complication ([place])   . Incontinence   . Nocturia   . OAB (overactive bladder)   . Pancreatic mass   . Prostate cancer [facility])    Past Surgical History:  Procedure Laterality Date  . CATARACT EXTRACTION, BILATERAL Bilateral   . CHOLECYSTECTOMY     . ENDOSCOPIC RETROGRADE CHOLANGIOPANCREATOGRAPHY (ERCP) WITH PROPOFOL N/A 09/27/2017   Procedure: ENDOSCOPIC RETROGRADE CHOLANGIOPANCREATOGRAPHY (ERCP) WITH PROPOFOL;  Surgeon: Lucilla Lame, MD;  Location: [facility] ENDOSCOPY;  Service: Endoscopy;  Laterality: N/A;  . ERCP N/A 11/17/2017   Procedure: ENDOSCOPIC RETROGRADE CHOLANGIOPANCREATOGRAPHY (ERCP);  Surgeon: Lucilla Lame, MD;  Location: [facility] ENDOSCOPY;  Service: Endoscopy;  Laterality: N/A;  . HERNIA REPAIR     x 3  . PORTA CATH INSERTION N/A 10/22/2017   Procedure: PORTA CATH INSERTION;  Surgeon: Algernon Huxley, MD;  Location: [facility] CV LAB;  Service: Cardiovascular;  Laterality: N/A;  . PROSTATECTOMY     Social History   Socioeconomic History  . Marital status: Married    Spouse name: Not on file  . Number of children: Not on file  . Years of education: Not on file  . Highest education level: Not on file  Occupational History  . Not on file  Social Needs  . Financial resource strain: Not on file  . Food insecurity:    Worry: Not on file    Inability: Not on file  . Transportation needs:    Medical: Not on file    Non-medical: Not on file  Tobacco Use  . Smoking status: Former Smoker    Last attempt to quit: 03/15/1967    Years since quitting: 50.8  . Smokeless tobacco: Former Systems developer    Types: Chew  . Tobacco comment: quit long time  Substance and Sexual Activity  . Alcohol use: No  Alcohol/week: 0.0 oz  . Drug use: No  . Sexual activity: Never  Lifestyle  . Physical activity:    Days per week: Not on file    Minutes per session: Not on file  . Stress: Not on file  Relationships  . Social connections:    Talks on phone: Not on file    Gets together: Not on file    Attends religious service: Not on file    Active member of club or organization: Not on file    Attends meetings of clubs or organizations: Not on file    Relationship status: Not on file  Other Topics Concern  . Not on file  Social History  Narrative  . Not on file   Family History  Problem Relation Age of Onset  . Prostate cancer Brother   . Bladder Cancer Brother   . Pancreatic cancer Brother   . Prostate cancer Brother   . Lymphoma Mother   . Leukemia Father   . Breast cancer Sister   . Uterine cancer Sister   . Kidney cancer Neg Hx    Allergies  Allergen Reactions  . Sulfa Antibiotics Nausea And Vomiting   Prior to Admission medications   Medication Sig Start Date End Date Taking? Authorizing Provider  acetaminophen (TYLENOL) 325 MG tablet Take 2 tablets (650 mg total) by mouth every 6 (six) hours as needed for mild pain (or Fever >/= 101). 12/14/17  Yes Gladstone Lighter, MD  ALPRAZolam Duanne Moron) 0.25 MG tablet Take 1 tablet (0.25 mg total) by mouth at bedtime as needed for anxiety. 12/01/17  Yes Lloyd Huger, MD  amoxicillin-clavulanate (AUGMENTIN) 875-125 MG tablet Take 1 tablet by mouth every 12 (twelve) hours for 10 days. 12/14/17 12/24/17 Yes Gladstone Lighter, MD  apixaban (ELIQUIS) 5 MG TABS tablet Take 2 tablets twice a a day until 12/18/17 and then 1 tablet twice a day after that 12/14/17  Yes Gladstone Lighter, MD  bacitracin ointment Apply topically 2 (two) times daily. Apply to the blistered area on the medial left ankle once or twice a day 12/14/17  Yes Gladstone Lighter, MD  Cholecalciferol (VITAMIN D3) 2000 units capsule Take 2,000 Units by mouth daily.  03/05/17  Yes [provider]  doxazosin (CARDURA) 2 MG tablet Take 2 mg by mouth daily.  12/31/16 12/31/17 Yes [provider]  DULoxetine (CYMBALTA) 20 MG capsule Take 1 capsule by mouth daily. 09/12/17  Yes [provider]  HYDROcodone-acetaminophen (NORCO/VICODIN) 5-325 MG tablet Take 1 tablet by mouth every 6 (six) hours as needed for moderate pain or severe pain. 12/14/17  Yes Gladstone Lighter, MD  hydrOXYzine (VISTARIL) 25 MG capsule Take 12.5 mg by mouth 3 (three) times daily as needed for itching.   Yes [provider]  LEVEMIR FLEXTOUCH 100 UNIT/ML Pen Inject 5 Units into the skin 2 (two) times daily. 12/14/17  Yes Gladstone Lighter, MD  magnesium hydroxide (MILK OF MAGNESIA) 400 MG/5ML suspension Take 30 mLs by mouth daily.   Yes [provider]  megestrol (MEGACE) 40 MG tablet Take 40 mg by mouth daily. 10/30/17  Yes [provider]  Multiple Vitamin (MULTI-VITAMINS) TABS Take 1 tablet by mouth daily.    Yes [provider]  ondansetron (ZOFRAN) 4 MG tablet Take 1 tablet (4 mg total) by mouth every 8 (eight) hours as needed for nausea or vomiting. 10/27/17  Yes Lloyd Huger, MD  pantoprazole (PROTONIX) 40 MG tablet TAKE 1 TABLET TWICE A DAY 02/08/15  Yes [provider]  polyethylene glycol (MIRALAX / GLYCOLAX) packet Take 17 g by mouth daily as needed for mild constipation. 12/14/17  Yes Gladstone Lighter, MD  prochlorperazine (COMPAZINE) 10 MG tablet Take 1 tablet (10 mg total) by mouth every 6 (six) hours as needed (Nausea or vomiting). 10/16/17  Yes Lloyd Huger, MD  protein supplement shake (PREMIER PROTEIN) LIQD Take 325 mLs (11 oz total) by mouth 2 (two) times daily between meals. 09/28/17  Yes Wieting, Richard, MD  senna-docusate (SENOKOT-S) 8.6-50 MG tablet Take 1 tablet by mouth 2 (two) times daily.   Yes [provider]  sertraline (ZOLOFT) 50 MG tablet Take 1 tablet by mouth daily. 07/14/17  Yes [provider]  spironolactone (ALDACTONE) 25 MG tablet Take 25 mg by mouth daily.   Yes [provider]  traZODone (DESYREL) 50 MG tablet Take 50 mg by mouth at bedtime.  12/29/16  Yes [provider]  triamcinolone ointment (KENALOG) 0.5 % Apply 1 application topically 2 (two) times daily. Do not apply to the blister part on the left ankle 12/14/17  Yes Gladstone Lighter, MD  vitamin B-12 (CYANOCOBALAMIN) 1000 MCG tablet Take 1,000 mcg by mouth.    Yes [provider]   Recent Labs    12/19/17 2210  WBC 4.6  HGB  10.7*  HCT 31.7*  PLT 281  K 3.3*  CL 110  CO2 22  BUN 18  CREATININE 0.72  GLUCOSE 181*  CALCIUM 8.2*  INR 1.20   Dg Chest 1 View  Result Date: 12/19/2017 CLINICAL DATA:  Dementia patient found on floor. Presumed unwitnessed fall. EXAM: CHEST  1 VIEW COMPARISON:  12/10/2017 FINDINGS: Tip of the right chest port in the distal SVC. Unchanged heart size and mediastinal contours with aortic atherosclerosis and tortuosity. No focal airspace opacity, pulmonary edema or pleural effusion. No pneumothorax. Bones are under mineralized without acute osseous abnormality. IMPRESSION: 1. No acute chest finding. 2. Aortic tortuosity and atherosclerosis. Electronically Signed   By: Jeb Levering M.D.   On: 12/19/2017 23:05   Ct Head Wo Contrast  Result Date: 12/19/2017 CLINICAL DATA:  Patient fell this evening.  Trauma to head and neck. EXAM: CT HEAD WITHOUT CONTRAST CT CERVICAL SPINE WITHOUT CONTRAST TECHNIQUE: Multidetector CT imaging of the head and cervical spine was performed following the standard protocol without intravenous contrast. Multiplanar CT image reconstructions of the cervical spine were also generated. COMPARISON:  None. FINDINGS: CT HEAD FINDINGS BRAIN: There is sulcal and ventricular prominence consistent with superficial and central atrophy. No intraparenchymal hemorrhage, mass effect nor midline shift. Periventricular and subcortical white matter hypodensities consistent with chronic small vessel ischemic disease are identified. No acute large vascular territory infarcts. No abnormal extra-axial fluid collections. Basal cisterns are not effaced and midline. VASCULAR: Moderate calcific atherosclerosis of the carotid siphons. SKULL: No skull fracture. No significant scalp soft tissue swelling. SINUSES/ORBITS: The mastoid air-cells are clear. The included paranasal sinuses are well-aerated.The included ocular globes and orbital contents are non-suspicious. Bilateral lens replacements. OTHER:  None. CT CERVICAL SPINE FINDINGS Alignment: Maintained cervical lordosis. Intact craniocervical relationship. Osteoarthritis of the atlantodental interval with joint space narrowing, sclerosis and subcortical cystic change. Skull base and vertebrae: No acute fracture. No primary bone lesion or focal pathologic process. Soft tissues and spinal canal: No prevertebral fluid or swelling. No visible canal hematoma. Disc levels: Moderate disc space narrowing at C6-7 with small posterior marginal osteophytes. No canal stenosis. Mild to moderate foraminal stenosis on the right at  C3-4, on the left at C4-5 and bilaterally at C6-7. Uncovertebral joint osteoarthritic Ankylosed C2-3 facets bilaterally. Multilevel degenerative facet arthropathy. Upper chest: Scarring at the apices. Other: Extracranial carotid arteriosclerosis bilaterally. No thyroid mass or thyromegaly. Port catheter partially visualized along the anterior chest wall. IMPRESSION: 1. Atrophy with chronic small vessel ischemia. No acute intracranial abnormality. 2. Cervical spondylosis without acute cervical spine fracture. Electronically Signed   By: Ashley Royalty M.D.   On: 12/19/2017 23:27   Ct Cervical Spine Wo Contrast  Result Date: 12/19/2017 CLINICAL DATA:  Patient fell this evening.  Trauma to head and neck. EXAM: CT HEAD WITHOUT CONTRAST CT CERVICAL SPINE WITHOUT CONTRAST TECHNIQUE: Multidetector CT imaging of the head and cervical spine was performed following the standard protocol without intravenous contrast. Multiplanar CT image reconstructions of the cervical spine were also generated. COMPARISON:  None. FINDINGS: CT HEAD FINDINGS BRAIN: There is sulcal and ventricular prominence consistent with superficial and central atrophy. No intraparenchymal hemorrhage, mass effect nor midline shift. Periventricular and subcortical white matter hypodensities consistent with chronic small vessel ischemic disease are identified. No acute large vascular  territory infarcts. No abnormal extra-axial fluid collections. Basal cisterns are not effaced and midline. VASCULAR: Moderate calcific atherosclerosis of the carotid siphons. SKULL: No skull fracture. No significant scalp soft tissue swelling. SINUSES/ORBITS: The mastoid air-cells are clear. The included paranasal sinuses are well-aerated.The included ocular globes and orbital contents are non-suspicious. Bilateral lens replacements. OTHER: None. CT CERVICAL SPINE FINDINGS Alignment: Maintained cervical lordosis. Intact craniocervical relationship. Osteoarthritis of the atlantodental interval with joint space narrowing, sclerosis and subcortical cystic change. Skull base and vertebrae: No acute fracture. No primary bone lesion or focal pathologic process. Soft tissues and spinal canal: No prevertebral fluid or swelling. No visible canal hematoma. Disc levels: Moderate disc space narrowing at C6-7 with small posterior marginal osteophytes. No canal stenosis. Mild to moderate foraminal stenosis on the right at C3-4, on the left at C4-5 and bilaterally at C6-7. Uncovertebral joint osteoarthritic Ankylosed C2-3 facets bilaterally. Multilevel degenerative facet arthropathy. Upper chest: Scarring at the apices. Other: Extracranial carotid arteriosclerosis bilaterally. No thyroid mass or thyromegaly. Port catheter partially visualized along the anterior chest wall. IMPRESSION: 1. Atrophy with chronic small vessel ischemia. No acute intracranial abnormality. 2. Cervical spondylosis without acute cervical spine fracture. Electronically Signed   By: Ashley Royalty M.D.   On: 12/19/2017 23:27   Dg Hip Unilat  With Pelvis 2-3 Views Right  Result Date: 12/19/2017 CLINICAL DATA:  Found down at care facility. EXAM: DG HIP (WITH OR WITHOUT PELVIS) 2-3V RIGHT COMPARISON:  None. FINDINGS: Acute comminuted RIGHT femur intertrochanteric fracture with impaction, varus angulation of the distal bony fragments. No dislocation. Osteopenia  without destructive bony lesions. Surgical clips project in the pelvis. Surgical coil material LEFT pelvis most compatible with herniorrhaphy. IMPRESSION: Acute displaced RIGHT femur intertrochanteric fracture. No dislocation. Electronically Signed   By: Elon Alas M.D.   On: 12/19/2017 23:05     Positive ROS: All other systems have been reviewed and were otherwise negative with the exception of those mentioned in the HPI and as above.  Physical Exam: BP 137/79 (BP Location: Left Arm)   Pulse 86   Temp 97.9 F (36.6 C) (Oral)   Resp 18   Ht 5\' 10"  (1.778 m)   Wt 70.8 kg (156 lb)   SpO2 97%   BMI 22.38 kg/m  General:  Drowsy but easily arousable, no acute distress Psychiatric:  Patient is NOT competent for consent, A&O  x 2 to name and place, but not time, non-agitated Cardiovascular:  No pedal edema, regular rate and rhythm Respiratory:  No wheezing, non-labored breathing GI:  Abdomen is soft and non-tender Skin:  No lesions in the area of chief complaint, no erythema Neurologic:  Sensation intact distally, CN grossly intact Lymphatic:  No gross axillary or cervical lymphadenopathy  Orthopedic Exam:  RLE: Able to DF/PF/EHL SILT grossly over foot Foot wwp +log roll and axial load Skin intact over RLE and planned incision sites  X-rays:  As above: R intertrochanteric hip fracture  Assessment/Plan: 84 yo M w/R intertrochanteric hip fracture with LLE DVT and pancreatic cancer on chemotherapy 1. I discussed the various treatment options including both surgical and non-surgical management of the fracture with the patient's family at bedside. We discussed the high risk of perioperative complications due to patient's age, dementia, and other co-morbidities. After discussion of risks, benefits, and alternatives to surgery, the family and patient were in agreement to proceed with surgery. The goals of surgery would be to provide adequate pain relief and allow for mobilization. Plan  for surgery is R hip cephalomedullary nailing today, 12/21/2017. 2. NPO until OR 3. Eliquis has been held in advance of OR since 12/19/17. I discussed this with the Vascular Surgery team. He has many risk factors for DVT propagation including pancreatic cancer, known acute DVT with <2 weeks of treatment, and hip fracture. Therefore, the recommendation was for an IVC filter preoperatively. He will undergo this procedure prior to R hip cephalomedullary nailing. Plan for re-starting Eliquis on POD#1.  4. Family to have discussion with goals of care in the next few days to revisit DNR status.         Leim Fabry   12/21/2017 8:18 AM

## 2017-12-21 NOTE — Progress Notes (Signed)
Pt returned from PACU to room 159. Pt is arousable, but very drowsy. Dressings CDI to 3 surgical incisions, foley in place. Pt has PAD in left groin- special recoveries NR-[name]. Site is level 0.   [place], [name]

## 2017-12-21 NOTE — H&P (Signed)
Admission H&P reviewed. No significant changes noted.

## 2017-12-21 NOTE — Progress Notes (Signed)
[facility] [place] NAME: Gerald Alvarez    MR#:  [id number]  DATE OF BIRTH:  [birth year]  SUBJECTIVE:  Scheduled for surgery. D/w family and ortho. Also requested vascular c/s to evaluate for IVC filter placement consideration before ortho surgery  REVIEW OF SYSTEMS:   Review of Systems  Unable to perform ROS: Dementia   Tolerating Diet:yes Tolerating PT: pending  DRUG ALLERGIES:   Allergies  Allergen Reactions  . Sulfa Antibiotics Nausea And Vomiting    VITALS:  Blood pressure (!) 100/49, pulse 97, temperature 98.5 F (36.9 C), temperature source Oral, resp. rate 18, height 5\' 10"  (1.778 m), weight 70.8 kg (156 lb), SpO2 95 %.  PHYSICAL EXAMINATION:   Physical Exam  GENERAL:  84 y.o.-year-old patient lying in the bed with no acute distress.  EYES: Pupils equal, round, reactive to light and accommodation. No scleral icterus. Extraocular muscles intact.  HEENT: Head atraumatic, normocephalic. Oropharynx and nasopharynx clear.  NECK:  Supple, no jugular venous distention. No thyroid enlargement, no tenderness.  LUNGS: Normal breath sounds bilaterally, no wheezing, rales, rhonchi. No use of accessory muscles of respiration.  CARDIOVASCULAR: S1, S2 normal. No murmurs, rubs, or gallops.  ABDOMEN: Soft, nontender, nondistended. Bowel sounds present. No organomegaly or mass.  EXTREMITIES: No cyanosis, clubbing or edema b/l.    NEUROLOGIC: Cranial nerves II through XII are intact. No focal Motor or sensory deficits b/l.   PSYCHIATRIC:  patient is alert and oriented x 3.  SKIN: No obvious rash, lesion, or ulcer.   LABORATORY PANEL:  CBC Recent Labs  Lab 12/19/17 2210  WBC 4.6  HGB 10.7*  HCT 31.7*  PLT 281    Chemistries  Recent Labs  Lab 12/19/17 2210 12/20/17 0754  NA 135  --   K 3.3*  --   CL 110  --   CO2 22  --   GLUCOSE 181*  --   BUN 18  --   CREATININE 0.72  --   CALCIUM 8.2*  --   MG  --  2.1  AST 26   --   ALT 17  --   ALKPHOS 44  --   BILITOT 0.6  --    Cardiac Enzymes No results for input(s): TROPONINI in the last 168 hours. RADIOLOGY:  Dg Chest 1 View  Result Date: 12/19/2017 CLINICAL DATA:  Dementia patient found on floor. Presumed unwitnessed fall. EXAM: CHEST  1 VIEW COMPARISON:  12/10/2017 FINDINGS: Tip of the right chest port in the distal SVC. Unchanged heart size and mediastinal contours with aortic atherosclerosis and tortuosity. No focal airspace opacity, pulmonary edema or pleural effusion. No pneumothorax. Bones are under mineralized without acute osseous abnormality. IMPRESSION: 1. No acute chest finding. 2. Aortic tortuosity and atherosclerosis. Electronically Signed   By: Jeb Levering M.D.   On: 12/19/2017 23:05   Ct Head Wo Contrast  Result Date: 12/19/2017 CLINICAL DATA:  Patient fell this evening.  Trauma to head and neck. EXAM: CT HEAD WITHOUT CONTRAST CT CERVICAL SPINE WITHOUT CONTRAST TECHNIQUE: Multidetector CT imaging of the head and cervical spine was performed following the standard protocol without intravenous contrast. Multiplanar CT image reconstructions of the cervical spine were also generated. COMPARISON:  None. FINDINGS: CT HEAD FINDINGS BRAIN: There is sulcal and ventricular prominence consistent with superficial and central atrophy. No intraparenchymal hemorrhage, mass effect nor midline shift. Periventricular and subcortical white matter hypodensities consistent with chronic small vessel ischemic disease are identified. No acute  large vascular territory infarcts. No abnormal extra-axial fluid collections. Basal cisterns are not effaced and midline. VASCULAR: Moderate calcific atherosclerosis of the carotid siphons. SKULL: No skull fracture. No significant scalp soft tissue swelling. SINUSES/ORBITS: The mastoid air-cells are clear. The included paranasal sinuses are well-aerated.The included ocular globes and orbital contents are non-suspicious. Bilateral  lens replacements. OTHER: None. CT CERVICAL SPINE FINDINGS Alignment: Maintained cervical lordosis. Intact craniocervical relationship. Osteoarthritis of the atlantodental interval with joint space narrowing, sclerosis and subcortical cystic change. Skull base and vertebrae: No acute fracture. No primary bone lesion or focal pathologic process. Soft tissues and spinal canal: No prevertebral fluid or swelling. No visible canal hematoma. Disc levels: Moderate disc space narrowing at C6-7 with small posterior marginal osteophytes. No canal stenosis. Mild to moderate foraminal stenosis on the right at C3-4, on the left at C4-5 and bilaterally at C6-7. Uncovertebral joint osteoarthritic Ankylosed C2-3 facets bilaterally. Multilevel degenerative facet arthropathy. Upper chest: Scarring at the apices. Other: Extracranial carotid arteriosclerosis bilaterally. No thyroid mass or thyromegaly. Port catheter partially visualized along the anterior chest wall. IMPRESSION: 1. Atrophy with chronic small vessel ischemia. No acute intracranial abnormality. 2. Cervical spondylosis without acute cervical spine fracture. Electronically Signed   By: Ashley Royalty M.D.   On: 12/19/2017 23:27   Ct Cervical Spine Wo Contrast  Result Date: 12/19/2017 CLINICAL DATA:  Patient fell this evening.  Trauma to head and neck. EXAM: CT HEAD WITHOUT CONTRAST CT CERVICAL SPINE WITHOUT CONTRAST TECHNIQUE: Multidetector CT imaging of the head and cervical spine was performed following the standard protocol without intravenous contrast. Multiplanar CT image reconstructions of the cervical spine were also generated. COMPARISON:  None. FINDINGS: CT HEAD FINDINGS BRAIN: There is sulcal and ventricular prominence consistent with superficial and central atrophy. No intraparenchymal hemorrhage, mass effect nor midline shift. Periventricular and subcortical white matter hypodensities consistent with chronic small vessel ischemic disease are identified. No  acute large vascular territory infarcts. No abnormal extra-axial fluid collections. Basal cisterns are not effaced and midline. VASCULAR: Moderate calcific atherosclerosis of the carotid siphons. SKULL: No skull fracture. No significant scalp soft tissue swelling. SINUSES/ORBITS: The mastoid air-cells are clear. The included paranasal sinuses are well-aerated.The included ocular globes and orbital contents are non-suspicious. Bilateral lens replacements. OTHER: None. CT CERVICAL SPINE FINDINGS Alignment: Maintained cervical lordosis. Intact craniocervical relationship. Osteoarthritis of the atlantodental interval with joint space narrowing, sclerosis and subcortical cystic change. Skull base and vertebrae: No acute fracture. No primary bone lesion or focal pathologic process. Soft tissues and spinal canal: No prevertebral fluid or swelling. No visible canal hematoma. Disc levels: Moderate disc space narrowing at C6-7 with small posterior marginal osteophytes. No canal stenosis. Mild to moderate foraminal stenosis on the right at C3-4, on the left at C4-5 and bilaterally at C6-7. Uncovertebral joint osteoarthritic Ankylosed C2-3 facets bilaterally. Multilevel degenerative facet arthropathy. Upper chest: Scarring at the apices. Other: Extracranial carotid arteriosclerosis bilaterally. No thyroid mass or thyromegaly. Port catheter partially visualized along the anterior chest wall. IMPRESSION: 1. Atrophy with chronic small vessel ischemia. No acute intracranial abnormality. 2. Cervical spondylosis without acute cervical spine fracture. Electronically Signed   By: Ashley Royalty M.D.   On: 12/19/2017 23:27   Dg Hip Operative Unilat W Or W/o Pelvis Right  Result Date: 12/21/2017 CLINICAL DATA:  Intraoperative imaging for fixation of a right intertrochanteric fracture which the patient suffered in a fall 12/19/2017. Initial encounter. EXAM: OPERATIVE RIGHT HIP (WITH PELVIS IF PERFORMED) 10 VIEWS TECHNIQUE: Fluoroscopic  spot image(s) were  submitted for interpretation post-operatively. COMPARISON:  Plain films right hip 12/19/2017. FINDINGS: Series of intraoperative fluoroscopic spot views demonstrates a hip screw and intramedullary nail in place for fixation of an intertrochanteric fracture. Two distal screws and an intramedullary nail are noted. No acute abnormality. Position and alignment of the patient's fracture are improved. IMPRESSION: Intraoperative imaging for fixation of a right intertrochanteric fracture. Electronically Signed   By: Inge Rise M.D.   On: 12/21/2017 13:02   Dg Hip Unilat  With Pelvis 2-3 Views Right  Result Date: 12/19/2017 CLINICAL DATA:  Found down at care facility. EXAM: DG HIP (WITH OR WITHOUT PELVIS) 2-3V RIGHT COMPARISON:  None. FINDINGS: Acute comminuted RIGHT femur intertrochanteric fracture with impaction, varus angulation of the distal bony fragments. No dislocation. Osteopenia without destructive bony lesions. Surgical clips project in the pelvis. Surgical coil material LEFT pelvis most compatible with herniorrhaphy. IMPRESSION: Acute displaced RIGHT femur intertrochanteric fracture. No dislocation. Electronically Signed   By: Elon Alas M.D.   On: 12/19/2017 23:05   ASSESSMENT AND PLAN:  Gerald Alvarez  is a 84 y.o. 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  1.) Intertroch femur Fx: Pt p/w 1d Hx acute displaced R femur intertroch Fx w/o dislocation 2/2 fall OOB at rehab. Pt on Eliquis 2/2 recent LLE DVT, which has been held in anticipation of orthopedic repair today 12/21/2017.  - d/w Dr Leim Fabry - Will request vascular c/s to evaluate for IVC filter on lt side considering high risk for thromboembolism off Eliquis  2.) LLE DVT: Hold Eliquis, as above. -Patient has  short segment popliteal left DVT.  - Will resume eliquis as soon as possible after surgery once okay with orthopedic- likely tomorrow - vascular considering to place IVC filter placement before ortho surgery  3.) LLE cellulitis: c/w Augmentin (stop date 12/24/2017), topical Bacitracin + Triamcinolone. Wound consult. -Last dose for Augmentin is 12/24/17 - family requesting Podiatry c/s as Dr Elvina Mattes knows the patient for leg blisters - c/s podiatry  4.) Hypokalemia: K+ 3.3. Replete and monitor.  5.) IDDM:  - Continue sliding scale insulin  6.) Pancreatic Ca: patient on megestrol-- discussed with pharmacy given new DVT will discontinue megace  7.) BPH: c/w home Doxazosin.  8.) Anx/dep: c/w home Xanax, Cymbalta, Hydroxyzine, Zoloft.  9.) FEN/GI: Diabetic diet, C/w home Protonix.  10.) DVT PPx: Holding full-dose therapeutic anticoagulation (Eliquis) for anticipated procedure today 12/21/2017 AM.  11.) Code Status: Full code per family.  D/w son and daughter at bedside.  Case discussed with Care Management/Social Worker. Management plans discussed with the patient, Dr Tarry Kos patel, family and they are in agreement.  CODE STATUS: full  DVT Prophylaxis: TEDS and SCD  TOTAL TIME TAKING CARE OF THIS PATIENT: *30* minutes.  >50% time spent on counselling and coordination of care  POSSIBLE D/C IN *2-3* DAYS, DEPENDING ON CLINICAL CONDITION.  Note: This dictation was prepared with Dragon dictation along with smaller phrase technology. Any transcriptional errors that result from this process are unintentional.  Max Sane M.D on 12/21/2017 at 7:08 PM  Between 7am to 6pm - Pager - [phone]  After 6pm go to [email or web address] - password [email or web address] [facility]  Office  [phone]  CC: Primary care physician; Rusty Aus, MDPatient ID: [name],  male   DOB: 12/15/1925, 84 y.o.   MRN: [id number]

## 2017-12-21 NOTE — Progress Notes (Signed)
Family Meeting Note  Advance Directive:yes  Today a meeting took place with the Patient and Daughter at bedside.   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   Patient's progosis: < 12 months and Goals for treatment: Full Code  Additional follow-up to be provided: Ongoing discussion with family for code status - hopefully they consider DNR  Time spent during discussion:20 minutes  Max Sane, MD

## 2017-12-21 NOTE — Op Note (Signed)
[facility]   OPERATIVE NOTE    PRE-OPERATIVE DIAGNOSIS: Recent, acute DVT, hip fracture requiring surgery and cessation of anticoagulation, pancreatic cancer  POST-OPERATIVE DIAGNOSIS: same as above  PROCEDURE: 1.   Ultrasound guidance for vascular access to the left femoral vein 2.   Catheter placement into the inferior vena cava 3.   Inferior venacavogram and left iliofemoral venogram 4.   Placement of a Cook select IVC filter  SURGEON: Leotis Pain, MD  ASSISTANT(S): None  ANESTHESIA: local with 1 mg of Versed  ESTIMATED BLOOD LOSS: 3 cc  CONTRAST: 20 cc  FLUORO TIME: 1.3 minutes  FINDING(S): 1.  Patent IVC  SPECIMEN(S):  none  INDICATIONS:   Gerald Alvarez. is a 84 y.o. male who presents with a hip fracture.  He was started on anticoagulation for an acute DVT only 2 weeks ago and this is had to be held for the last 2 days prior to surgery.  He will be at a higher thromboembolic risk around the time of surgery.  Inferior vena cava filter is indicated for this reason.  Risks and benefits including filter thrombosis, migration, fracture, bleeding, and infection were all discussed.  We discussed that all IVC filters that we place can be removed if desired from the patient once the need for the filter has passed.    DESCRIPTION: After obtaining full informed written consent, the patient was brought back to the vascular suite. The skin was sterilely prepped and draped in a sterile surgical field was created. Moderate conscious sedation was administered during a face to face encounter with the patient throughout the procedure with my supervision of the RN administering medicines and monitoring the patient's vital signs, pulse oximetry, telemetry and mental status throughout from the start of the procedure until the patient was taken to the recovery room. The left femoral was accessed under direct ultrasound guidance without difficulty with a Seldinger needle  and a J-wire was then placed.  The J-wire would not pass easily, and I placed a 6 French sheath to perform a left iliofemoral venogram.  This demonstrated that the iliac and femoral veins were patent, but there was marked tortuosity.  Using this is a guide, I was able to pass the wire into the inferior vena cava.  After skin nick and dilatation, the delivery sheath was placed into the inferior vena cava and an inferior venacavogram was performed. This demonstrated a patent IVC with the level of the renal veins at L1.  His biliary stent was visible as well.  The filter was then deployed into the inferior vena cava at the level of L2 just below the renal veins. The delivery sheath was then removed. Pressure was held. Sterile dressings were placed. The patient tolerated the procedure well and was taken to the recovery room in stable condition.  COMPLICATIONS: None  CONDITION: Stable  Leotis Pain  12/21/2017, 10:03 AM   This note was created with Dragon Medical transcription system. Any errors in dictation are purely unintentional.

## 2017-12-21 NOTE — Progress Notes (Signed)
Patient clinically stable post IVC filter placement. Vitals stable, no bleeding nor hematoma at left groin site. PAD device to left groin with 40 ml air in place to protect against bleeding.  Report given TO CRNA  [place] regarding procedure with questions answered.

## 2017-12-21 NOTE — Telephone Encounter (Signed)
Thanks, I will add him to the f/u board for now as well until we know for sure when he is coming back in.

## 2017-12-21 NOTE — Consult Note (Signed)
[facility] Vascular Consult Note  MRN : [id number]  Gerald Alvarez. is a 84 y.o. (December 28, 1925) male who presents with chief complaint of  Chief Complaint  Patient presents with  . Fall  .  History of Present Illness: I am asked to see the patient by Dr. Posey Pronto for evaluation for an IVC filter.  The patient was admitted with an unwitnessed fall and found to have an intertrochanteric hip fracture.  He is scheduled for surgery today for that.  Complicating the situation is the fact that he was started on Eliquis about 2 weeks ago for a new, acute DVT.  That has been held for the last 48 hours.  His thromboembolic risk is significantly increased around the time of his hip surgery as well and with a new DVT not currently getting any anticoagulation we are consulted for an IVC filter.  The patient provides little history at this time.  This is obtained largely from the previous medical record  Current Facility-Administered Medications  Medication Dose Route Frequency Provider Last Rate Last Dose  . [MAR Hold] acetaminophen (TYLENOL) tablet 650 mg  650 mg Oral Q6H PRN Arta Silence, MD      . Doug Sou Hold] ALPRAZolam Duanne Moron) tablet 0.25 mg  0.25 mg Oral QHS PRN Arta Silence, MD   0.25 mg at 12/20/17 2006  . [MAR Hold] ALPRAZolam (XANAX) tablet 0.25 mg  0.25 mg Oral BID PRN Fritzi Mandes, MD      . Doug Sou Hold] amoxicillin-clavulanate (AUGMENTIN) 875-125 MG per tablet 1 tablet  1 tablet Oral Q12H Arta Silence, MD   1 tablet at 12/21/17 0142  . [MAR Hold] bacitracin ointment   Topical BID Fritzi Mandes, MD      . Doug Sou Hold] bisacodyl (DULCOLAX) EC tablet 5 mg  5 mg Oral Daily PRN Arta Silence, MD      . Doug Sou Hold] cholecalciferol (VITAMIN D) tablet 2,000 Units  2,000 Units Oral Daily Arta Silence, MD   2,000 Units at 12/20/17 0850  . [MAR Hold] doxazosin (CARDURA) tablet 2 mg  2 mg Oral Daily Arta Silence, MD   2 mg at 12/20/17 0849  . [MAR  Hold] DULoxetine (CYMBALTA) DR capsule 20 mg  20 mg Oral Daily Arta Silence, MD   20 mg at 12/20/17 0851  . [MAR Hold] HYDROmorphone (DILAUDID) injection 0.5 mg  0.5 mg Intravenous Q3H PRN Arta Silence, MD       Or  . Doug Sou Hold] HYDROmorphone (DILAUDID) injection 1 mg  1 mg Intravenous Q3H PRN Arta Silence, MD   1 mg at 12/20/17 2053  . [MAR Hold] hydrOXYzine (ATARAX/VISTARIL) tablet 12.5 mg  12.5 mg Oral TID PRN Arta Silence, MD      . Doug Sou Hold] insulin aspart (novoLOG) injection 0-15 Units  0-15 Units Subcutaneous TID WC Arta Silence, MD   3 Units at 12/21/17 0821  . [MAR Hold] magnesium hydroxide (MILK OF MAGNESIA) suspension 30 mL  30 mL Oral Daily Arta Silence, MD      . Doug Sou Hold] multivitamin with minerals tablet 1 tablet  1 tablet Oral Daily Arta Silence, MD   1 tablet at 12/20/17 0850  . [MAR Hold] ondansetron (ZOFRAN) injection 4 mg  4 mg Intravenous Q6H PRN Arta Silence, MD      . Doug Sou Hold] pantoprazole (PROTONIX) EC tablet 40 mg  40 mg Oral BID Arta Silence, MD   40 mg at 12/20/17 2005  . [MAR Hold] polyethylene glycol (MIRALAX / GLYCOLAX)  packet 17 g  17 g Oral Daily PRN Arta Silence, MD      . Doug Sou Hold] senna-docusate (Senokot-S) tablet 1 tablet  1 tablet Oral QHS PRN Arta Silence, MD      . Doug Sou Hold] sertraline (ZOLOFT) tablet 50 mg  50 mg Oral Daily Arta Silence, MD   50 mg at 12/20/17 0851  . [MAR Hold] spironolactone (ALDACTONE) tablet 25 mg  25 mg Oral Daily Arta Silence, MD   25 mg at 12/20/17 0850  . [MAR Hold] traMADol (ULTRAM) tablet 50 mg  50 mg Oral Q6H PRN Fritzi Mandes, MD   50 mg at 12/21/17 0143  . [MAR Hold] traZODone (DESYREL) tablet 50 mg  50 mg Oral QHS Arta Silence, MD   50 mg at 12/20/17 2005  . [MAR Hold] triamcinolone ointment (KENALOG) 0.5 % 1 application  1 application Topical BID Arta Silence, MD   1 application at 08/67/61 2133  . [MAR Hold] vitamin B-12  (CYANOCOBALAMIN) tablet 1,000 mcg  1,000 mcg Oral Q M,W,F Arta Silence, MD        Past Medical History:  Diagnosis Date  . BPH (benign prostatic hyperplasia)   . Closed fracture of right hip ([place])   . Diabetes mellitus without complication ([place])   . Incontinence   . Nocturia   . OAB (overactive bladder)   . Pancreatic mass   . Prostate cancer [facility])     Past Surgical History:  Procedure Laterality Date  . CATARACT EXTRACTION, BILATERAL Bilateral   . CHOLECYSTECTOMY    . ENDOSCOPIC RETROGRADE CHOLANGIOPANCREATOGRAPHY (ERCP) WITH PROPOFOL N/A 09/27/2017   Procedure: ENDOSCOPIC RETROGRADE CHOLANGIOPANCREATOGRAPHY (ERCP) WITH PROPOFOL;  Surgeon: Lucilla Lame, MD;  Location: [facility] ENDOSCOPY;  Service: Endoscopy;  Laterality: N/A;  . ERCP N/A 11/17/2017   Procedure: ENDOSCOPIC RETROGRADE CHOLANGIOPANCREATOGRAPHY (ERCP);  Surgeon: Lucilla Lame, MD;  Location: [facility] ENDOSCOPY;  Service: Endoscopy;  Laterality: N/A;  . HERNIA REPAIR     x 3  . PORTA CATH INSERTION N/A 10/22/2017   Procedure: PORTA CATH INSERTION;  Surgeon: Algernon Huxley, MD;  Location: [facility] LAB;  Service: Cardiovascular;  Laterality: N/A;  . PROSTATECTOMY      Social History Social History   Tobacco Use  . Smoking status: Former Smoker    Last attempt to quit: 03/15/1967    Years since quitting: 50.8  . Smokeless tobacco: Former Systems developer    Types: Chew  . Tobacco comment: quit long time  Substance Use Topics  . Alcohol use: No    Alcohol/week: 0.0 oz  . Drug use: No    Family History Family History  Problem Relation Age of Onset  . Prostate cancer Brother   . Bladder Cancer Brother   . Pancreatic cancer Brother   . Prostate cancer Brother   . Lymphoma Mother   . Leukemia Father   . Breast cancer Sister   . Uterine cancer Sister   . Kidney cancer Neg Hx     Allergies  Allergen Reactions  . Sulfa Antibiotics Nausea And Vomiting     REVIEW OF SYSTEMS (Negative unless checked) Unable to  reliably obtain from the patient.  He can provide little history likely from a combination of narcotics from his hip fracture, his advanced age and multiple ongoing issues.  Physical Examination  Vitals:   12/21/17 0053 12/21/17 0728 12/21/17 0845 12/21/17 0902  BP: 139/85 137/79 119/66 139/65  Pulse: 99 86 81 81  Resp: 19 18 18 16   Temp: 98.4 F (  36.9 C) 97.9 F (36.6 C) 97.7 F (36.5 C) 98.7 F (37.1 C)  TempSrc: Oral Oral Tympanic Axillary  SpO2: 95% 97% 95% 97%  Weight:      Height:       Body mass index is 22.38 kg/m. Gen: Frail, elderly man Head: [place]/AT, No temporalis wasting.  Ear/Nose/Throat: Hearing grossly intact, nares w/o erythema or drainage, oropharynx w/o Erythema/Exudate Eyes: Sclera non-icteric, conjunctiva clear Neck: Trachea midline.  No JVD.  Pulmonary:  Good air movement, respirations not labored, equal bilaterally.  Cardiac: RRR, no JVD Vascular:  Vessel Right Left  Radial Palpable Palpable                                    Musculoskeletal: No significant lower extremity swelling.  Right leg deformity Neurologic: Sensation grossly intact in extremities.  Symmetrical.  Speech is very difficult to discern and patient seems confused Psychiatric: Judgment and insight appear to be reduced.  Patient appears confused Dermatologic: No rashes or ulcers noted.  No cellulitis or open wounds.       CBC Lab Results  Component Value Date   WBC 4.6 12/19/2017   HGB 10.7 (L) 12/19/2017   HCT 31.7 (L) 12/19/2017   MCV 101.5 (H) 12/19/2017   PLT 281 12/19/2017    BMET    Component Value Date/Time   NA 135 12/19/2017 2210   K 3.3 (L) 12/19/2017 2210   CL 110 12/19/2017 2210   CO2 22 12/19/2017 2210   GLUCOSE 181 (H) 12/19/2017 2210   BUN 18 12/19/2017 2210   CREATININE 0.72 12/19/2017 2210   CALCIUM 8.2 (L) 12/19/2017 2210   GFRNONAA >60 12/19/2017 2210   GFRAA >60 12/19/2017 2210   Estimated Creatinine Clearance: 62.7 mL/min (by C-G  formula based on SCr of 0.72 mg/dL).  COAG Lab Results  Component Value Date   INR 1.20 12/19/2017   INR 1.16 12/11/2017   INR 0.94 09/25/2017    Radiology Dg Chest 1 View  Result Date: 12/19/2017 CLINICAL DATA:  Dementia patient found on floor. Presumed unwitnessed fall. EXAM: CHEST  1 VIEW COMPARISON:  12/10/2017 FINDINGS: Tip of the right chest port in the distal SVC. Unchanged heart size and mediastinal contours with aortic atherosclerosis and tortuosity. No focal airspace opacity, pulmonary edema or pleural effusion. No pneumothorax. Bones are under mineralized without acute osseous abnormality. IMPRESSION: 1. No acute chest finding. 2. Aortic tortuosity and atherosclerosis. Electronically Signed   By: Jeb Levering M.D.   On: 12/19/2017 23:05   Dg Chest 1 View  Result Date: 12/10/2017 CLINICAL DATA:  Dyspnea. EXAM: CHEST  1 VIEW COMPARISON:  CT scan of January 04, 2017. FINDINGS: The heart size and mediastinal contours are within normal limits. Atherosclerosis of thoracic aorta is noted. Right internal jugular Port-A-Cath is noted with distal tip in expected position of cavoatrial junction. No pneumothorax or pleural effusion is noted. Right lung is clear. Minimal left basilar subsegmental atelectasis is noted. The visualized skeletal structures are unremarkable. IMPRESSION: Minimal left basilar subsegmental atelectasis. Aortic Atherosclerosis (ICD10-I70.0). Electronically Signed   By: Marijo Conception, M.D.   On: 12/10/2017 13:39   Ct Head Wo Contrast  Result Date: 12/19/2017 CLINICAL DATA:  Patient fell this evening.  Trauma to head and neck. EXAM: CT HEAD WITHOUT CONTRAST CT CERVICAL SPINE WITHOUT CONTRAST TECHNIQUE: Multidetector CT imaging of the head and cervical spine was performed following the standard protocol without  intravenous contrast. Multiplanar CT image reconstructions of the cervical spine were also generated. COMPARISON:  None. FINDINGS: CT HEAD FINDINGS BRAIN: There is  sulcal and ventricular prominence consistent with superficial and central atrophy. No intraparenchymal hemorrhage, mass effect nor midline shift. Periventricular and subcortical white matter hypodensities consistent with chronic small vessel ischemic disease are identified. No acute large vascular territory infarcts. No abnormal extra-axial fluid collections. Basal cisterns are not effaced and midline. VASCULAR: Moderate calcific atherosclerosis of the carotid siphons. SKULL: No skull fracture. No significant scalp soft tissue swelling. SINUSES/ORBITS: The mastoid air-cells are clear. The included paranasal sinuses are well-aerated.The included ocular globes and orbital contents are non-suspicious. Bilateral lens replacements. OTHER: None. CT CERVICAL SPINE FINDINGS Alignment: Maintained cervical lordosis. Intact craniocervical relationship. Osteoarthritis of the atlantodental interval with joint space narrowing, sclerosis and subcortical cystic change. Skull base and vertebrae: No acute fracture. No primary bone lesion or focal pathologic process. Soft tissues and spinal canal: No prevertebral fluid or swelling. No visible canal hematoma. Disc levels: Moderate disc space narrowing at C6-7 with small posterior marginal osteophytes. No canal stenosis. Mild to moderate foraminal stenosis on the right at C3-4, on the left at C4-5 and bilaterally at C6-7. Uncovertebral joint osteoarthritic Ankylosed C2-3 facets bilaterally. Multilevel degenerative facet arthropathy. Upper chest: Scarring at the apices. Other: Extracranial carotid arteriosclerosis bilaterally. No thyroid mass or thyromegaly. Port catheter partially visualized along the anterior chest wall. IMPRESSION: 1. Atrophy with chronic small vessel ischemia. No acute intracranial abnormality. 2. Cervical spondylosis without acute cervical spine fracture. Electronically Signed   By: Ashley Royalty M.D.   On: 12/19/2017 23:27   Ct Cervical Spine Wo Contrast  Result  Date: 12/19/2017 CLINICAL DATA:  Patient fell this evening.  Trauma to head and neck. EXAM: CT HEAD WITHOUT CONTRAST CT CERVICAL SPINE WITHOUT CONTRAST TECHNIQUE: Multidetector CT imaging of the head and cervical spine was performed following the standard protocol without intravenous contrast. Multiplanar CT image reconstructions of the cervical spine were also generated. COMPARISON:  None. FINDINGS: CT HEAD FINDINGS BRAIN: There is sulcal and ventricular prominence consistent with superficial and central atrophy. No intraparenchymal hemorrhage, mass effect nor midline shift. Periventricular and subcortical white matter hypodensities consistent with chronic small vessel ischemic disease are identified. No acute large vascular territory infarcts. No abnormal extra-axial fluid collections. Basal cisterns are not effaced and midline. VASCULAR: Moderate calcific atherosclerosis of the carotid siphons. SKULL: No skull fracture. No significant scalp soft tissue swelling. SINUSES/ORBITS: The mastoid air-cells are clear. The included paranasal sinuses are well-aerated.The included ocular globes and orbital contents are non-suspicious. Bilateral lens replacements. OTHER: None. CT CERVICAL SPINE FINDINGS Alignment: Maintained cervical lordosis. Intact craniocervical relationship. Osteoarthritis of the atlantodental interval with joint space narrowing, sclerosis and subcortical cystic change. Skull base and vertebrae: No acute fracture. No primary bone lesion or focal pathologic process. Soft tissues and spinal canal: No prevertebral fluid or swelling. No visible canal hematoma. Disc levels: Moderate disc space narrowing at C6-7 with small posterior marginal osteophytes. No canal stenosis. Mild to moderate foraminal stenosis on the right at C3-4, on the left at C4-5 and bilaterally at C6-7. Uncovertebral joint osteoarthritic Ankylosed C2-3 facets bilaterally. Multilevel degenerative facet arthropathy. Upper chest: Scarring at  the apices. Other: Extracranial carotid arteriosclerosis bilaterally. No thyroid mass or thyromegaly. Port catheter partially visualized along the anterior chest wall. IMPRESSION: 1. Atrophy with chronic small vessel ischemia. No acute intracranial abnormality. 2. Cervical spondylosis without acute cervical spine fracture. Electronically Signed   By: Ashley Royalty  M.D.   On: 12/19/2017 23:27   US Venous Img Lower Unilateral Left  Result Date: 12/10/2017 CLINICAL DATA:  Left lower extremity pain and edema. History of pancreatic cancer. Evaluate for DVT. EXAM: LEFT LOWER EXTREMITY VENOUS DOPPLER ULTRASOUND TECHNIQUE: Gray-scale sonography with graded compression, as well as color Doppler and duplex ultrasound were performed to evaluate the lower extremity deep venous systems from the level of the common femoral vein and including the common femoral, femoral, profunda femoral, popliteal and calf veins including the posterior tibial, peroneal and gastrocnemius veins when visible. The superficial great saphenous vein was also interrogated. Spectral Doppler was utilized to evaluate flow at rest and with distal augmentation maneuvers in the common femoral, femoral and popliteal veins. COMPARISON:  None. FINDINGS: Contralateral Common Femoral Vein: Respiratory phasicity is normal and symmetric with the symptomatic side. No evidence of thrombus. Normal compressibility. Common Femoral Vein: No evidence of thrombus. Normal compressibility, respiratory phasicity and response to augmentation. Saphenofemoral Junction: No evidence of thrombus. Normal compressibility and flow on color Doppler imaging. Profunda Femoral Vein: No evidence of thrombus. Normal compressibility and flow on color Doppler imaging. Femoral Vein: No evidence of thrombus. Normal compressibility, respiratory phasicity and response to augmentation. Popliteal Vein: There is hypoechoic expansile occlusive thrombus within the left popliteal vein (images 29 through  32). Calf Veins: No evidence of thrombus. Normal compressibility and flow on color Doppler imaging. Superficial Great Saphenous Vein: No evidence of thrombus. Normal compressibility. Other Findings:  None. IMPRESSION: Examination is positive for short segment occlusive DVT involving the left popliteal vein. Electronically Signed   By: Sandi Mariscal M.D.   On: 12/10/2017 16:22   Dg Hip Unilat  With Pelvis 2-3 Views Right  Result Date: 12/19/2017 CLINICAL DATA:  Found down at care facility. EXAM: DG HIP (WITH OR WITHOUT PELVIS) 2-3V RIGHT COMPARISON:  None. FINDINGS: Acute comminuted RIGHT femur intertrochanteric fracture with impaction, varus angulation of the distal bony fragments. No dislocation. Osteopenia without destructive bony lesions. Surgical clips project in the pelvis. Surgical coil material LEFT pelvis most compatible with herniorrhaphy. IMPRESSION: Acute displaced RIGHT femur intertrochanteric fracture. No dislocation. Electronically Signed   By: Elon Alas M.D.   On: 12/19/2017 23:05      Assessment/Plan 1.  Recent DVT.  Eliquis has been held for his hip fracture and upcoming surgery.  Should have an IVC filter placed as he will be at even higher risk of further thromboembolic complications in the perioperative period.  This will be done immediately and they can proceed with his hip surgery later today. 2.  Right hip fracture.  Increases risk of thromboembolic complications.  For repair later today after IVC filter was placed.  Given this, we will probably access the left femoral vein or the jugular vein. 3.  Pancreatic cancer.  This clearly increases his thrombotic risk.  Has completed 6 weeks of chemotherapy and his port is still in place.  The port is working well. 4.  Diabetes. Stable on outpatient medications and blood glucose control important in reducing the progression of atherosclerotic disease. Also, involved in wound healing. On appropriate medications.    Leotis Pain,  MD  12/21/2017 9:36 AM    This note was created with Dragon medical transcription system.  Any error is purely unintentional

## 2017-12-21 NOTE — Anesthesia Procedure Notes (Signed)
Procedure Name: Intubation Date/Time: 12/21/2017 11:05 AM Performed by: Allean Found, CRNA Pre-anesthesia Checklist: Patient identified, Emergency Drugs available, Suction available, Patient being monitored and Timeout performed Patient Re-evaluated:Patient Re-evaluated prior to induction Oxygen Delivery Method: Circle system utilized Preoxygenation: Pre-oxygenation with 100% oxygen Induction Type: IV induction Ventilation: Mask ventilation without difficulty Laryngoscope Size: Mac and 4 Grade View: Grade II Tube type: Oral Tube size: 7.5 mm Number of attempts: 1 Airway Equipment and Method: Stylet Secured at: 23 cm Tube secured with: Tape Comments: Dark red blood near esophageal opening.

## 2017-12-21 NOTE — Op Note (Signed)
DATE OF SURGERY: 12/21/2017  PREOPERATIVE DIAGNOSIS: Right intertrochanteric hip fracture  POSTOPERATIVE DIAGNOSIS: Right intertrochanteric hip fracture  PROCEDURE: Intramedullary nailing of R femur with cephalomedullary device  SURGEON: Cato Mulligan, MD  ANESTHESIA: Gen  EBL: 100 cc  IVF: per anesthesia record  COMPONENTS:  [employer]ker Long Gamma Nail: 11x318mm; 146mm lag screw; 2 distal interolocking screws.  INDICATIONS: Gerald Alvarez. is a 84 y.o. male who sustained a Right intertrochanteric fracture after a fall. Risks and benefits of intramedullary nailing were explained to the family due to patient's mental status. Risks include but are not limited to bleeding, infection, injury to tissues, nerves, vessels, nonunion/malunion, limb length discrepancy, and risks of anesthesia as well as postoperative medical conditions. The family understands these risks, has completed an informed consent, and wishes to proceed.  Of note, the patient had an acute DVT of the left popliteal vein diagnosed on 12/10/17 and had been on Eliquis for this. This was discontinued on 12/19/17 at the time of admission. Given that he was at high risk for perioperative thromboembolic event given his hip fracture, acute DVT off of anticoagulation, and history of pancreatic cancer, Vascular Surgery was consulted preoperatively, and recommendation was for IVC filter. This was placed immediately prior to this procedure. Please see separate operative note from Dr. Leotis Pain for further details.    PROCEDURE:  The patient was brought into the operating room. After administering anesthesia, the patient was placed in the supine position on the Hana table. The uninjured leg was placed in an extended position while the injured lower extremity was placed in longitudinal traction. The fracture was reduced using longitudinal traction and internal rotation. The adequacy of reduction was verified fluoroscopically in AP and lateral  projections and found to be acceptable. The lateral aspects of the right hip and thigh were prepped with ChloraPrep solution before being draped sterilely. Preoperative IV antibiotics were administered. A timeout was performed to verify the appropriate surgical site, patient, and procedure.    The greater trochanter was identified and an approximately 6 cm incision was made about 3 fingerbreadths above the tip of the greater trochanter. The incision was carried down through the subcutaneous tissues to expose the gluteal fascia. This was split the length of the incision, providing access to the tip of the trochanter. Under fluoroscopic guidance, a guidewire was drilled through the tip of the trochanter into the proximal metaphysis to the level of the lesser trochanter. After verifying its position fluoroscopically in AP and lateral projections, it was overreamed with the opening reamer to the level of the lesser trochanter. A guidewire was passed down through the femoral canal to the supracondylar region. The adequacy of guidewire position was verified fluoroscopically in AP and lateral projections before the length of the guidewire within the canal was measured and a nail of appropriate length was selected. The guidewire was overreamed sequentially using the flexible reamers, beginning with an 11 mm reamer and progressing to a 13 mm reamer. This provided good cortical chatter. The [employer]ker Gamma Nail was selected and advanced to the appropriate depth as verified fluoroscopically.    The guide system for the lag screw was positioned and advanced through an approximately 4 cm stab incision over the lateral aspect of the proximal femur. The guidewire was drilled up through the femoral nail and into the femoral neck to rest within 5 mm of subchondral bone. After verifying its position in the femoral neck and head in both AP and lateral projections, the guidewire  was measured and appropriate sized lag screw was  selected. The guidewire was overreamed to the appropriate depth before the lag screw was inserted and advanced to the appropriate depth as verified fluoroscopically in AP and lateral projections. The lag screw was advanced, and the fracture was compressed as needed. The set screw was tightened and then untightened a quarter turn to allow for compression. Again, the adequacy of hardware position and fracture reduction was verified fluoroscopically in AP and lateral projections.   Attention was directed distally. Using the "perfect circle" technique, the leg and fluoroscopy machine were positioned appropriately. An approximate 3cm incision was made over the skin and IT band at the appropriate point before the drill bit was advanced through the cortex and across the static and oblong holes of the nail. Appropriate screw lengths were determined with a measuring guide. Two distal interlocking screws were placed. Again the adequacy of screw position was verified fluoroscopically in AP and lateral projections.   The wounds were irrigated thoroughly with sterile saline solution. Local anesthetic was injected to all wounds. Deep fascia of the IT band was closed with 0-Vicryl. The subcutaneous tissues were closed using 2-0 Vicryl interrupted sutures. The skin was closed using staples. Sterile occlusive dressings were applied to all wounds. The patient was then transferred to the recovery room in satisfactory condition after tolerating the procedure well.   POSTOPERATIVE PLAN: The patient will be WBAT on the operative extremity. Resume therapeutic anticoagulation with Eliquis on POD#1. Ancef x 24 hours. PT/OT on POD#1.

## 2017-12-21 NOTE — Anesthesia Post-op Follow-up Note (Signed)
Anesthesia QCDR form completed.        

## 2017-12-21 NOTE — Anesthesia Preprocedure Evaluation (Signed)
Anesthesia Evaluation  Patient identified by MRN, date of birth, ID band Patient awake    Reviewed: Allergy & Precautions, H&P , NPO status , Patient's Chart, lab work & pertinent test results, reviewed documented beta blocker date and time   History of Anesthesia Complications Negative for: history of anesthetic complications  Airway Mallampati: II  TM Distance: >3 FB Neck ROM: full    Dental  (+) Caps, Dental Advidsory Given, Teeth Intact   Pulmonary neg pulmonary ROS, former smoker,           Cardiovascular Exercise Tolerance: Good hypertension, (-) angina+ DVT  (-) CAD, (-) Past MI, (-) Cardiac Stents and (-) CABG (-) dysrhythmias + Valvular Problems/Murmurs      Neuro/Psych neg Seizures  Neuromuscular disease negative psych ROS   GI/Hepatic Neg liver ROS, GERD  ,  Endo/Other  diabetes  Renal/GU Renal disease  negative genitourinary   Musculoskeletal   Abdominal   Peds  Hematology negative hematology ROS (+)   Anesthesia Other Findings Past Medical History: No date: BPH (benign prostatic hyperplasia) No date: Diabetes mellitus without complication (HCC) No date: Incontinence No date: Nocturia No date: OAB (overactive bladder) No date: Prostate cancer (HCC)  BMI    Body Mass Index:  26.75 kg/m    Past Surgical History: No date: CHOLECYSTECTOMY No date: HERNIA REPAIR     Comment:  x 3 No date: PROSTATECTOMY  Patient with DVT in place, will have IVC filter placed prior to hip fracture repair.   Reproductive/Obstetrics negative OB ROS                             Anesthesia Physical  Anesthesia Plan  ASA: III  Anesthesia Plan: General   Post-op Pain Management:    Induction: Intravenous  PONV Risk Score and Plan: 2 and Ondansetron and Dexamethasone  Airway Management Planned: LMA and Oral ETT  Additional Equipment:   Intra-op Plan:   Post-operative Plan:  Extubation in OR  Informed Consent: I have reviewed the patients History and Physical, chart, labs and discussed the procedure including the risks, benefits and alternatives for the proposed anesthesia with the patient or authorized representative who has indicated his/her understanding and acceptance.   Dental Advisory Given  Plan Discussed with: Anesthesiologist, CRNA and Surgeon  Anesthesia Plan Comments:         Anesthesia Quick Evaluation

## 2017-12-21 NOTE — Transfer of Care (Signed)
Immediate Anesthesia Transfer of Care Note  Patient: Gerald Alvarez.  Procedure(s) Performed: INTRAMEDULLARY (IM) NAIL INTERTROCHANTRIC (Right Hip)  Patient Location: PACU  Anesthesia Type:General  Level of Consciousness: sedated  Airway & Oxygen Therapy: Patient connected to nasal cannula oxygen  Post-op Assessment: Post -op Vital signs reviewed and stable  Post vital signs: stable  Last Vitals:  Vitals Value Taken Time  BP 134/60 12/21/2017  1:21 PM  Temp    Pulse 87 12/21/2017  1:21 PM  Resp 20 12/21/2017  1:21 PM  SpO2 99 % 12/21/2017  1:21 PM    Last Pain:  Vitals:   12/21/17 0953  TempSrc:   PainSc: 0-No pain         Complications: No apparent anesthesia complications

## 2017-12-21 NOTE — Telephone Encounter (Signed)
Received call from daughter, [name]. Mr. Borba has fallen and broken his right hip. He is undergoing surgery today for repair. He will need appt for 4/16 cancelled. She will contact us once he is discharged from hospital back to rehab to reschedule. Oncology Nurse Navigator Documentation  Navigator Location: [facility] (12/21/17 1000)   )Navigator Encounter Type: Telephone (12/21/17 1000) Telephone: Incoming Call;Patient Update (12/21/17 1000)                                                  Time Spent with Patient: 15 (12/21/17 1000)

## 2017-12-22 ENCOUNTER — Inpatient Hospital Stay: Payer: Medicare Other

## 2017-12-22 ENCOUNTER — Encounter: Payer: Self-pay | Admitting: Orthopedic Surgery

## 2017-12-22 ENCOUNTER — Encounter: Payer: Self-pay | Admitting: *Deleted

## 2017-12-22 ENCOUNTER — Inpatient Hospital Stay: Payer: Medicare Other | Admitting: Oncology

## 2017-12-22 LAB — COMPREHENSIVE METABOLIC PANEL
ALT: 11 U/L — AB (ref 17–63)
AST: 16 U/L (ref 15–41)
Albumin: 2.2 g/dL — ABNORMAL LOW (ref 3.5–5.0)
Alkaline Phosphatase: 41 U/L (ref 38–126)
Anion gap: 5 (ref 5–15)
BILIRUBIN TOTAL: 0.7 mg/dL (ref 0.3–1.2)
BUN: 16 mg/dL (ref 6–20)
CHLORIDE: 109 mmol/L (ref 101–111)
CO2: 21 mmol/L — ABNORMAL LOW (ref 22–32)
CREATININE: 0.56 mg/dL — AB (ref 0.61–1.24)
Calcium: 7.6 mg/dL — ABNORMAL LOW (ref 8.9–10.3)
Glucose, Bld: 159 mg/dL — ABNORMAL HIGH (ref 65–99)
Potassium: 3.6 mmol/L (ref 3.5–5.1)
Sodium: 135 mmol/L (ref 135–145)
TOTAL PROTEIN: 4.7 g/dL — AB (ref 6.5–8.1)

## 2017-12-22 LAB — PREPARE RBC (CROSSMATCH)

## 2017-12-22 LAB — CBC
HCT: 23 % — ABNORMAL LOW (ref 40.0–52.0)
Hemoglobin: 7.7 g/dL — ABNORMAL LOW (ref 13.0–18.0)
MCH: 34.6 pg — ABNORMAL HIGH (ref 26.0–34.0)
MCHC: 33.5 g/dL (ref 32.0–36.0)
MCV: 103.2 fL — AB (ref 80.0–100.0)
PLATELETS: 214 10*3/uL (ref 150–440)
RBC: 2.23 MIL/uL — AB (ref 4.40–5.90)
RDW: 16.7 % — AB (ref 11.5–14.5)
WBC: 7.4 10*3/uL (ref 3.8–10.6)

## 2017-12-22 LAB — GLUCOSE, CAPILLARY
GLUCOSE-CAPILLARY: 150 mg/dL — AB (ref 65–99)
GLUCOSE-CAPILLARY: 183 mg/dL — AB (ref 65–99)
Glucose-Capillary: 182 mg/dL — ABNORMAL HIGH (ref 65–99)
Glucose-Capillary: 100 mg/dL — ABNORMAL HIGH (ref 65–99)

## 2017-12-22 LAB — ABO/RH: ABO/RH(D): A POS

## 2017-12-22 MED ORDER — SODIUM CHLORIDE 0.9 % IV SOLN
Freq: Once | INTRAVENOUS | Status: AC
  Administered 2017-12-22: 12:00:00 via INTRAVENOUS

## 2017-12-22 NOTE — Clinical Social Work Note (Addendum)
CSW spoke with patient and his family and they would like to return back to [facility].  CSW contacted [facility] and they will start insurance authorization again.  Formal assessment to follow.  Jones Broom. [place], MSW, [place]  12/22/2017 5:11 PM

## 2017-12-22 NOTE — Progress Notes (Signed)
OT Cancellation Note  Patient Details Name: Gerald Alvarez. MRN: [id number] DOB: 1926-01-21   Cancelled Treatment:    Reason Eval/Treat Not Completed: Other (comment). Order received, chart reviewed. Pt receiving 1 unit RBC transfusion. Will re-attempt OT evaluation at later date/time as pt is available and medically appropriate.   Jeni Salles, MPH, MS, OTR/L ascom [phone] 12/22/17, 12:54 PM

## 2017-12-22 NOTE — NC FL2 (Signed)
[place] LEVEL OF CARE SCREENING TOOL     IDENTIFICATION  Patient Name: Gerald Alvarez. Birthdate: 08/07/1926 Sex: male Admission Date (Current Location): 12/19/2017  [place] and [place] Number:  Engineering geologist and Address:  [facility], [address], [place], [place] [address]      Provider Number: [id number]  Attending Physician Name and Address:  Max Sane, MD  Relative Name and Phone Number:  Paxon Propes (Spouse) [phone] or Lyn Records (Daughter) [phone]    Current Level of Care: Hospital Recommended Level of Care: [place] Prior Approval Number:    Date Approved/Denied:   PASRR Number: [id number]  Discharge Plan: SNF    Current Diagnoses: Patient Active Problem List   Diagnosis Date Noted  . Closed intertrochanteric fracture of right hip, initial encounter ([place]) 12/20/2017  . Leucopenia   . Acute deep vein thrombosis (DVT) of popliteal vein of left lower extremity ([place])   . Cellulitis 12/10/2017  . Malignant neoplasm of pancreas ([place]) 10/13/2017  . Malnutrition of moderate degree 09/28/2017  . Common bile duct obstruction   . Pancreatic mass   . Jaundice 09/25/2017  . Low serum vitamin D 03/05/2017  . Adult idiopathic generalized osteoporosis 01/19/2017  . Vertebral compression fracture, with routine healing, subsequent encounter 01/19/2017  . B12 deficiency 10/23/2016  . Medicare annual wellness visit, initial 09/04/2016  . DD (diverticular disease) 03/10/2016  . Calculus of kidney 03/10/2016  . Bilateral tinnitus 03/10/2016  . Cardiomyopathy ([place]) 08/25/2014  . H/O malignant neoplasm of prostate 08/25/2014    Orientation RESPIRATION BLADDER Height & Weight     Self, Place  Normal Continent Weight: 156 lb (70.8 kg) Height:  5\' 10"  (177.8 cm)  BEHAVIORAL SYMPTOMS/MOOD NEUROLOGICAL BOWEL NUTRITION STATUS      Continent Diet(Carb Modified)  AMBULATORY STATUS COMMUNICATION OF  NEEDS Skin   Limited Assist Verbally Bruising, Surgical wounds                       Personal Care Assistance Level of Assistance  Bathing, Feeding, Dressing Bathing Assistance: Limited assistance Feeding assistance: Independent Dressing Assistance: Limited assistance     Functional Limitations Info  Sight, Hearing, Speech Sight Info: Adequate Hearing Info: Adequate Speech Info: Adequate    SPECIAL CARE FACTORS FREQUENCY  PT (By licensed PT)  5x a week                  Contractures Contractures Info: Not present    Additional Factors Info  Code Status, Allergies, Insulin Sliding Scale, Psychotropic Code Status Info: Full Code Allergies Info: SULFA ANTIBIOTICS  Psychotropic Info: sertraline (ZOLOFT) tablet 50 mg  Insulin Sliding Scale Info: insulin aspart (novoLOG) injection 0-15 Units 3x a day with meals.       Current Medications (12/22/2017):  This is the current hospital active medication list Current Facility-Administered Medications  Medication Dose Route Frequency Provider Last Rate Last Dose  . 0.9 %  sodium chloride infusion   Intravenous Continuous Leim Fabry, MD 75 mL/hr at 12/22/17 0552 75 mL/hr at 12/22/17 0552  . acetaminophen (TYLENOL) tablet 1,000 mg  1,000 mg Oral Q8H Leim Fabry, MD   1,000 mg at 12/22/17 1043  . ALPRAZolam Duanne Moron) tablet 0.25 mg  0.25 mg Oral QHS PRN Algernon Huxley, MD   0.25 mg at 12/20/17 2006  . ALPRAZolam Duanne Moron) tablet 0.25 mg  0.25 mg Oral BID PRN Algernon Huxley, MD      .  amoxicillin-clavulanate (AUGMENTIN) 875-125 MG per tablet 1 tablet  1 tablet Oral Q12H Algernon Huxley, MD   1 tablet at 12/22/17 1248  . apixaban (ELIQUIS) tablet 5 mg  5 mg Oral BID Leim Fabry, MD   5 mg at 12/22/17 1041  . bacitracin ointment   Topical BID Algernon Huxley, MD      . bisacodyl (DULCOLAX) EC tablet 5 mg  5 mg Oral Daily PRN Algernon Huxley, MD      . bisacodyl (DULCOLAX) suppository 10 mg  10 mg Rectal Daily PRN Leim Fabry, MD      .  cholecalciferol (VITAMIN D) tablet 2,000 Units  2,000 Units Oral Daily Algernon Huxley, MD   2,000 Units at 12/22/17 1037  . docusate sodium (COLACE) capsule 100 mg  100 mg Oral BID Leim Fabry, MD   100 mg at 12/22/17 1037  . doxazosin (CARDURA) tablet 2 mg  2 mg Oral Daily Algernon Huxley, MD   2 mg at 12/22/17 1042  . DULoxetine (CYMBALTA) DR capsule 20 mg  20 mg Oral Daily Algernon Huxley, MD   20 mg at 12/22/17 1042  . HYDROmorphone (DILAUDID) injection 0.25-0.5 mg  0.25-0.5 mg Intravenous Q2H PRN Leim Fabry, MD      . hydrOXYzine (ATARAX/VISTARIL) tablet 12.5 mg  12.5 mg Oral TID PRN Algernon Huxley, MD      . insulin aspart (novoLOG) injection 0-15 Units  0-15 Units Subcutaneous TID WC Algernon Huxley, MD   3 Units at 12/22/17 1249  . magnesium hydroxide (MILK OF MAGNESIA) suspension 30 mL  30 mL Oral Daily Algernon Huxley, MD   30 mL at 12/22/17 1045  . methocarbamol (ROBAXIN) tablet 500 mg  500 mg Oral Q6H PRN Leim Fabry, MD       Or  . methocarbamol (ROBAXIN) 500 mg in dextrose 5 % 50 mL IVPB  500 mg Intravenous Q6H PRN Leim Fabry, MD      . metoCLOPramide (REGLAN) tablet 5-10 mg  5-10 mg Oral Q8H PRN Leim Fabry, MD       Or  . metoCLOPramide (REGLAN) injection 5-10 mg  5-10 mg Intravenous Q8H PRN Leim Fabry, MD      . multivitamin with minerals tablet 1 tablet  1 tablet Oral Daily Algernon Huxley, MD   1 tablet at 12/22/17 1040  . ondansetron (ZOFRAN) tablet 4 mg  4 mg Oral Q6H PRN Leim Fabry, MD       Or  . ondansetron [facility]) injection 4 mg  4 mg Intravenous Q6H PRN Leim Fabry, MD      . oxyCODONE (Oxy IR/ROXICODONE) immediate release tablet 2.5-5 mg  2.5-5 mg Oral Q3H PRN Leim Fabry, MD      . pantoprazole (PROTONIX) EC tablet 40 mg  40 mg Oral BID Algernon Huxley, MD   40 mg at 12/22/17 1040  . senna-docusate (Senokot-S) tablet 1 tablet  1 tablet Oral QHS PRN Leim Fabry, MD      . sertraline (ZOLOFT) tablet 50 mg  50 mg Oral Daily Algernon Huxley, MD   50 mg at 12/22/17 1038  . sodium  phosphate (FLEET) 7-19 GM/118ML enema 1 enema  1 enema Rectal Once PRN Leim Fabry, MD      . spironolactone (ALDACTONE) tablet 25 mg  25 mg Oral Daily Algernon Huxley, MD   25 mg at 12/22/17 1042  . traMADol (ULTRAM) tablet 50 mg  50 mg Oral Q6H PRN  Algernon Huxley, MD   50 mg at 12/21/17 0143  . traZODone (DESYREL) tablet 50 mg  50 mg Oral QHS Algernon Huxley, MD   50 mg at 12/21/17 2328  . triamcinolone ointment (KENALOG) 0.5 % 1 application  1 application Topical BID Algernon Huxley, MD   1 application at 31/49/70 1046  . vitamin B-12 (CYANOCOBALAMIN) tablet 1,000 mcg  1,000 mcg Oral Q M,W,F Dew, Erskine Squibb, MD         Discharge Medications: Please see discharge summary for a list of discharge medications.  Relevant Imaging Results:  Relevant Lab Results:   Additional Information SS# [id number]  Anell Barr

## 2017-12-22 NOTE — Progress Notes (Signed)
Physical Therapy Treatment Patient Details Name: Gerald Alvarez. MRN: [id number] DOB: [birth year] Today's Date: 12/22/2017    History of Present Illness Pt admitted for R hip fracture s/p IM nailing 12/22/17. History includes dementia, pancreatic cancer, DM, and prostate cancer. Of note, previous admission on 4/4 secondary to L LE DVT and cellulitis. IVC filter placed prior to ortho Sx. L heel debrided this AM by podiatry.    PT Comments    Pt is making good progress towards goals and motivated to work with therapy, however very sleepy after just getting blood transfusion and getting back into bed. Agreeable to there-ex while supine, however still struggles with technique and cognition. Will continue to progress.   Follow Up Recommendations  SNF     Equipment Recommendations  None recommended by PT    Recommendations for Other Services       Precautions / Restrictions Precautions Precautions: Fall Restrictions Weight Bearing Restrictions: Yes RLE Weight Bearing: Weight bearing as tolerated    Mobility  Bed Mobility               General bed mobility comments: not performed as pt just got back in bed  Transfers                    Ambulation/Gait                 Stairs             Wheelchair Mobility    Modified Rankin (Stroke Patients Only)       Balance                                            Cognition Arousal/Alertness: Awake/alert Behavior During Therapy: WFL for tasks assessed/performed Overall Cognitive Status: Impaired/Different from baseline                                        Exercises Other Exercises Other Exercises: supine ther-ex on B LE including ankle pumps (has difficulty with technique), SLRs, hip add squeezes, hip abd/add, and attempted quad sets. All ther-ex performed x 10 reps with min/mod assist. Pt very sleepy during session.    General Comments        Pertinent  Vitals/Pain Pain Assessment: Faces Faces Pain Scale: Hurts a little bit Pain Location: R hip Pain Descriptors / Indicators: Operative site guarding Pain Intervention(s): Limited activity within patient's tolerance    Home Living                      Prior Function            PT Goals (current goals can now be found in the care plan section) Acute Rehab PT Goals Patient Stated Goal: to improve mobility PT Goal Formulation: With patient Time For Goal Achievement: 01/05/18 Potential to Achieve Goals: Good Additional Goals Additional Goal #1: Pt will be able to perform bed mobility/transfer with cga and safe technique in order to improve functional independence Progress towards PT goals: Progressing toward goals    Frequency    BID      PT Plan Current plan remains appropriate    Co-evaluation              AM-PAC PT "6  Clicks" Daily Activity  Outcome Measure  Difficulty turning over in bed (including adjusting bedclothes, sheets and blankets)?: Unable Difficulty moving from lying on back to sitting on the side of the bed? : Unable Difficulty sitting down on and standing up from a chair with arms (e.g., wheelchair, bedside commode, etc,.)?: Unable Help needed moving to and from a bed to chair (including a wheelchair)?: A Lot Help needed walking in hospital room?: A Lot Help needed climbing 3-5 steps with a railing? : Total 6 Click Score: 8    End of Session   Activity Tolerance: Patient tolerated treatment well Patient left: in bed;with bed alarm set Nurse Communication: Mobility status PT Visit Diagnosis: Unsteadiness on feet (R26.81);Other abnormalities of gait and mobility (R26.89);Muscle weakness (generalized) (M62.81);History of falling (Z91.81);Pain Pain - Right/Left: Right Pain - part of body: Hip     Time: 1610-9604 PT Time Calculation (min) (ACUTE ONLY): 15 min  Charges:  $Therapeutic Exercise: 8-22 mins                    G Codes:        Gerald Alvarez, PT, DPT [phone]    Gerald Alvarez 12/22/2017, 4:55 PM

## 2017-12-22 NOTE — Progress Notes (Signed)
[facility] [place] NAME: Hancel Ion    MR#:  [id number]  DATE OF BIRTH:  Oct 30, 1925  SUBJECTIVE:  Family pleased with care. Insisting on going back to [facility] (they've got bed saved and they're paying for it) REVIEW OF SYSTEMS:   Review of Systems  Unable to perform ROS: Dementia   Tolerating Diet:yes Tolerating PT: STR/SNF DRUG ALLERGIES:   Allergies  Allergen Reactions  . Sulfa Antibiotics Nausea And Vomiting    VITALS:  Blood pressure 126/61, pulse 96, temperature 98.4 F (36.9 C), temperature source Oral, resp. rate 17, height 5\' 10"  (1.778 m), weight 70.8 kg (156 lb), SpO2 100 %.  PHYSICAL EXAMINATION:   Physical Exam  GENERAL:  84 y.o.-year-old patient lying in the bed with no acute distress.  EYES: Pupils equal, round, reactive to light and accommodation. No scleral icterus. Extraocular muscles intact.  HEENT: Head atraumatic, normocephalic. Oropharynx and nasopharynx clear.  NECK:  Supple, no jugular venous distention. No thyroid enlargement, no tenderness.  LUNGS: Normal breath sounds bilaterally, no wheezing, rales, rhonchi. No use of accessory muscles of respiration.  CARDIOVASCULAR: S1, S2 normal. No murmurs, rubs, or gallops.  ABDOMEN: Soft, nontender, nondistended. Bowel sounds present. No organomegaly or mass.  EXTREMITIES: No cyanosis, clubbing or edema b/l. Rt leg   NEUROLOGIC: Cranial nerves II through XII are intact. No focal Motor or sensory deficits b/l.   PSYCHIATRIC:  patient is alert and oriented x 3.  SKIN: No obvious rash, lesion, or ulcer.  LABORATORY PANEL:  CBC Recent Labs  Lab 12/22/17 0433  WBC 7.4  HGB 7.7*  HCT 23.0*  PLT 214    Chemistries  Recent Labs  Lab 12/20/17 0754 12/22/17 0433  NA  --  135  K  --  3.6  CL  --  109  CO2  --  21*  GLUCOSE  --  159*  BUN  --  16  CREATININE  --  0.56*  CALCIUM  --  7.6*  MG 2.1  --   AST  --  16  ALT  --  11*  ALKPHOS   --  41  BILITOT  --  0.7   Cardiac Enzymes No results for input(s): TROPONINI in the last 168 hours. RADIOLOGY:  Dg Hip Operative Unilat W Or W/o Pelvis Right  Result Date: 12/21/2017 CLINICAL DATA:  Intraoperative imaging for fixation of a right intertrochanteric fracture which the patient suffered in a fall 12/19/2017. Initial encounter. EXAM: OPERATIVE RIGHT HIP (WITH PELVIS IF PERFORMED) 10 VIEWS TECHNIQUE: Fluoroscopic spot image(s) were submitted for interpretation post-operatively. COMPARISON:  Plain films right hip 12/19/2017. FINDINGS: Series of intraoperative fluoroscopic spot views demonstrates a hip screw and intramedullary nail in place for fixation of an intertrochanteric fracture. Two distal screws and an intramedullary nail are noted. No acute abnormality. Position and alignment of the patient's fracture are improved. IMPRESSION: Intraoperative imaging for fixation of a right intertrochanteric fracture. Electronically Signed   By: Inge Rise M.D.   On: 12/21/2017 13:02   ASSESSMENT AND PLAN:  Kenyetta Fife  is a 84 y.o. 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  1.) Intertroch femur Fx: s/p repair POD 2 - d/w Dr Leim Fabry - s/p IVC filter on lt side  considering high risk for thromboembolism by vascular  2.) LLE DVT: Hold Eliquis, as above. - Patient has short segment popliteal left DVT.  - Resumed eliquis  - s/p IVC filter placement before ortho surgery  3.) LLE cellulitis: c/w Augmentin (stop date 12/24/2017), topical Bacitracin + Triamcinolone. Wound consult. -Last dose for Augmentin is 12/24/17 - family requesting Podiatry c/s as Dr Elvina Mattes knows the patient for leg blisters - seen by podiatry  4.) Hypokalemia: K+ 3.6. Replete and monitor.  5.) IDDM:  -  Continue sliding scale insulin  6.) Pancreatic Ca: patient on megestrol-- discussed with pharmacy given new DVT will discontinue megace  7.) BPH: c/w home Doxazosin.  8.) Anx/dep: c/w home Xanax, Cymbalta, Hydroxyzine, Zoloft.  9.) FEN/GI: Diabetic diet, C/w home Protonix.  10.) DVT PPx: Holding full-dose therapeutic anticoagulation (Eliquis) for anticipated procedure today 12/21/2017 AM.  11.) Code Status: Full code per family.  D/w son and daughter at bedside.  Case discussed with Care Management/Social Worker. Management plans discussed with the patient, Dr Tarry Kos patel, family and they are in agreement.  CODE STATUS: full  DVT Prophylaxis: TEDS and SCD  TOTAL TIME TAKING CARE OF THIS PATIENT: *30* minutes.  >50% time spent on counselling and coordination of care  POSSIBLE D/C IN *2-3* DAYS, DEPENDING ON CLINICAL CONDITION.  Note: This dictation was prepared with Dragon dictation along with smaller phrase technology. Any transcriptional errors that result from this process are unintentional.  Max Sane M.D on 12/22/2017 at 3:41 PM  Between 7am to 6pm - Pager - [phone]  After 6pm go to [email or web address] - Proofreader  [facility]  Office  [phone]  CC: Primary care physician; Rusty Aus, MDPatient ID: [name], male   DOB: 10/07/1925, 84 y.o.   MRN: [id number]

## 2017-12-22 NOTE — Progress Notes (Signed)
Pharmacy Electrolyte Monitoring Consult:  Pharmacy consulted to assist in monitoring and replacing electrolytes in this 84 y.o. male admitted on 12/19/2017 with Fall  Patient ordered potassium 38mEq PO x 1. Patient ordered spironolactone 25mg  PO Daily.   Labs:  Sodium (mmol/L)  Date Value  12/22/2017 135   Potassium (mmol/L)  Date Value  12/22/2017 3.6   Magnesium (mg/dL)  Date Value  12/20/2017 2.1   Calcium (mg/dL)  Date Value  12/22/2017 7.6 (L)   Albumin (g/dL)  Date Value  12/22/2017 2.2 (L)    Plan: Electrolytes WNL this morning. No supplementation needed.  CMP ordered with AM labs.  Pharmacy will continue to monitor and adjust per consult.   Pernell Dupre, PharmD, BCPS Clinical Pharmacist 12/22/2017 7:19 AM

## 2017-12-22 NOTE — Consult Note (Signed)
Reason for Consult: Ulceration on his left heel Referring Physician: Kamerin Grumbine. is an 84 y.o. male.  HPI: This is an 84 year old male who relates a history of a sore on his left foot for the last month or so.  Has recently had a blood clot in his left leg.  Has had bandaging to the area on the left heel.  Recently had a fractured hip and was admitted for surgery.  Past Medical History:  Diagnosis Date  . BPH (benign prostatic hyperplasia)   . Closed fracture of right hip ([place])   . Diabetes mellitus without complication ([place])   . Incontinence   . Nocturia   . OAB (overactive bladder)   . Pancreatic mass   . Prostate cancer [facility])     Past Surgical History:  Procedure Laterality Date  . CATARACT EXTRACTION, BILATERAL Bilateral   . CHOLECYSTECTOMY    . ENDOSCOPIC RETROGRADE CHOLANGIOPANCREATOGRAPHY (ERCP) WITH PROPOFOL N/A 09/27/2017   Procedure: ENDOSCOPIC RETROGRADE CHOLANGIOPANCREATOGRAPHY (ERCP) WITH PROPOFOL;  Surgeon: Lucilla Lame, MD;  Location: [facility] ENDOSCOPY;  Service: Endoscopy;  Laterality: N/A;  . ERCP N/A 11/17/2017   Procedure: ENDOSCOPIC RETROGRADE CHOLANGIOPANCREATOGRAPHY (ERCP);  Surgeon: Lucilla Lame, MD;  Location: [facility] ENDOSCOPY;  Service: Endoscopy;  Laterality: N/A;  . HERNIA REPAIR     x 3  . IVC FILTER INSERTION N/A 12/21/2017   Procedure: IVC FILTER INSERTION;  Surgeon: Algernon Huxley, MD;  Location: [place] CV LAB;  Service: Cardiovascular;  Laterality: N/A;  . PORTA CATH INSERTION N/A 10/22/2017   Procedure: PORTA CATH INSERTION;  Surgeon: Algernon Huxley, MD;  Location: [place] CV LAB;  Service: Cardiovascular;  Laterality: N/A;  . PROSTATECTOMY      Family History  Problem Relation Age of Onset  . Prostate cancer Brother   . Bladder Cancer Brother   . Pancreatic cancer Brother   . Prostate cancer Brother   . Lymphoma Mother   . Leukemia Father   . Breast cancer Sister   . Uterine cancer Sister   . Kidney cancer Neg Hx      Social History:  reports that he quit smoking about 50 years ago. He has quit using smokeless tobacco. His smokeless tobacco use included chew. He reports that he does not drink alcohol or use drugs.  Allergies:  Allergies  Allergen Reactions  . Sulfa Antibiotics Nausea And Vomiting    Medications:  Scheduled: . acetaminophen  1,000 mg Oral Q8H  . amoxicillin-clavulanate  1 tablet Oral Q12H  . apixaban  5 mg Oral BID  . bacitracin   Topical BID  . cholecalciferol  2,000 Units Oral Daily  . docusate sodium  100 mg Oral BID  . doxazosin  2 mg Oral Daily  . DULoxetine  20 mg Oral Daily  . insulin aspart  0-15 Units Subcutaneous TID WC  . magnesium hydroxide  30 mL Oral Daily  . multivitamin with minerals  1 tablet Oral Daily  . pantoprazole  40 mg Oral BID  . sertraline  50 mg Oral Daily  . spironolactone  25 mg Oral Daily  . traZODone  50 mg Oral QHS  . triamcinolone ointment  1 application Topical BID  . vitamin B-12  1,000 mcg Oral Q M,W,F    Results for orders placed or performed during the hospital encounter of 12/19/17 (from the past 48 hour(s))  Glucose, capillary     Status: Abnormal   Collection Time: 12/20/17 12:18 PM  Result Value Ref  Range   Glucose-Capillary 147 (H) 65 - 99 mg/dL  Glucose, capillary     Status: Abnormal   Collection Time: 12/20/17  4:16 PM  Result Value Ref Range   Glucose-Capillary 124 (H) 65 - 99 mg/dL  Glucose, capillary     Status: Abnormal   Collection Time: 12/21/17  7:31 AM  Result Value Ref Range   Glucose-Capillary 155 (H) 65 - 99 mg/dL  Glucose, capillary     Status: Abnormal   Collection Time: 12/21/17  1:33 PM  Result Value Ref Range   Glucose-Capillary 154 (H) 65 - 99 mg/dL  Glucose, capillary     Status: Abnormal   Collection Time: 12/21/17  4:27 PM  Result Value Ref Range   Glucose-Capillary 150 (H) 65 - 99 mg/dL  Glucose, capillary     Status: Abnormal   Collection Time: 12/21/17  9:23 PM  Result Value Ref Range    Glucose-Capillary 151 (H) 65 - 99 mg/dL  Comprehensive metabolic panel     Status: Abnormal   Collection Time: 12/22/17  4:33 AM  Result Value Ref Range   Sodium 135 135 - 145 mmol/L    Comment: ELECTROLYTES REPEATED BY SDR.Marland KitchenMarland KitchenSouthampton [facility] Hospital   Potassium 3.6 3.5 - 5.1 mmol/L   Chloride 109 101 - 111 mmol/L   CO2 21 (L) 22 - 32 mmol/L   Glucose, Bld 159 (H) 65 - 99 mg/dL   BUN 16 6 - 20 mg/dL   Creatinine, Ser 0.56 (L) 0.61 - 1.24 mg/dL   Calcium 7.6 (L) 8.9 - 10.3 mg/dL   Total Protein 4.7 (L) 6.5 - 8.1 g/dL   Albumin 2.2 (L) 3.5 - 5.0 g/dL   AST 16 15 - 41 U/L   ALT 11 (L) 17 - 63 U/L   Alkaline Phosphatase 41 38 - 126 U/L   Total Bilirubin 0.7 0.3 - 1.2 mg/dL   GFR calc non Af Amer >60 >60 mL/min   GFR calc Af Amer >60 >60 mL/min    Comment: (NOTE) The eGFR has been calculated using the CKD EPI equation. This calculation has not been validated in all clinical situations. eGFR's persistently <60 mL/min signify possible Chronic Kidney Disease.    Anion gap 5 5 - 15    Comment: Performed at [facility], [address] [place], [place] [address]  CBC     Status: Abnormal   Collection Time: 12/22/17  4:33 AM  Result Value Ref Range   WBC 7.4 3.8 - 10.6 K/uL   RBC 2.23 (L) 4.40 - 5.90 MIL/uL   Hemoglobin 7.7 (L) 13.0 - 18.0 g/dL   HCT 23.0 (L) 40.0 - 52.0 %   MCV 103.2 (H) 80.0 - 100.0 fL   MCH 34.6 (H) 26.0 - 34.0 pg   MCHC 33.5 32.0 - 36.0 g/dL   RDW 16.7 (H) 11.5 - 14.5 %   Platelets 214 150 - 440 K/uL    Comment: Performed at [facility], [address], [place], [place] [address]  ABO/Rh     Status: None   Collection Time: 12/22/17  4:33 AM  Result Value Ref Range   ABO/RH(D)      A POS Performed at [facility], [address] [place], [place] [address]   Prepare RBC     Status: None   Collection Time: 12/22/17  7:31 AM  Result Value Ref Range   Order Confirmation      ORDER PROCESSED BY BLOOD BANK Performed at [facility], [address] [place], [place] [address]   Glucose, capillary     Status: Abnormal   Collection Time: 12/22/17  7:40 AM  Result Value Ref Range   Glucose-Capillary 150 (H) 65 - 99 mg/dL    Dg Hip Operative Unilat W Or W/o Pelvis Right  Result Date: 12/21/2017 CLINICAL DATA:  Intraoperative imaging for fixation of a right intertrochanteric fracture which the patient suffered in a fall 12/19/2017. Initial encounter. EXAM: OPERATIVE RIGHT HIP (WITH PELVIS IF PERFORMED) 10 VIEWS TECHNIQUE: Fluoroscopic spot image(s) were submitted for interpretation post-operatively. COMPARISON:  Plain films right hip 12/19/2017. FINDINGS: Series of intraoperative fluoroscopic spot views demonstrates a hip screw and intramedullary nail in place for fixation of an intertrochanteric fracture. Two distal screws and an intramedullary nail are noted. No acute abnormality. Position and alignment of the patient's fracture are improved. IMPRESSION: Intraoperative imaging for fixation of a right intertrochanteric fracture. Electronically Signed   By: Inge Rise M.D.   On: 12/21/2017 13:02    Review of Systems  Constitutional: Negative for chills and fever.  HENT: Negative.   Eyes: Negative.   Respiratory: Negative.   Cardiovascular: Negative.   Gastrointestinal: Negative for nausea and vomiting.  Genitourinary: Negative.   Musculoskeletal: Positive for joint pain.       Recent fracture in his right hip with surgical intervention.  Skin:       Patient relates a sore on the side of his left foot for the last month.  He has had a blood clot in the left leg with some significant swelling.  Neurological: Negative.   Endo/Heme/Allergies: Negative.   Psychiatric/Behavioral: Negative.    Blood pressure (!) 129/57, pulse 88, temperature 98 F (36.7 C), temperature source Oral, resp. rate 18, height 5' 10"  (1.778 m), weight 70.8 kg (156 lb), SpO2 93 %. Physical Exam  Cardiovascular:  Pulses are palpable.   Musculoskeletal:  Adequate range of motion of the pedal joints.  Muscle testing deferred.  Neurological:  Sensation grossly intact.  Skin:  The skin is warm dry and somewhat atrophic.  Some dry hemorrhagic skin slough from blistering along the medial aspect of the left rear foot.  Upon debridement skin is intact with no active drainage.  Dimensions of the ulcerative area on the left heel are approximately 4 cm x 4 cm.  Assessment/Plan: Assessment: Superficial blister with ulceration left heel  Plan: Debrided the superficial skin slough from the left heel sharply using a pair of tissue nippers.  The area was dry so there is no need for bandaging.  Stable for transfer with no wound care orders.  Durward Fortes 12/22/2017, 8:20 AM

## 2017-12-22 NOTE — Anesthesia Postprocedure Evaluation (Signed)
Anesthesia Post Note  Patient: Gerald Alvarez.  Procedure(s) Performed: INTRAMEDULLARY (IM) NAIL INTERTROCHANTRIC (Right Hip)  Patient location during evaluation: PACU Anesthesia Type: General Level of consciousness: awake and alert Pain management: pain level controlled Vital Signs Assessment: post-procedure vital signs reviewed and stable Respiratory status: spontaneous breathing, nonlabored ventilation, respiratory function stable and patient connected to nasal cannula oxygen Cardiovascular status: blood pressure returned to baseline and stable Postop Assessment: no apparent nausea or vomiting Anesthetic complications: no     Last Vitals:  Vitals:   12/22/17 1209 12/22/17 1413  BP: (!) 108/55 126/61  Pulse: 94 96  Resp: 18 17  Temp: 36.8 C 36.9 C  SpO2: 97% 100%    Last Pain:  Vitals:   12/22/17 1413  TempSrc: Oral                 Martha Clan

## 2017-12-22 NOTE — Evaluation (Signed)
Physical Therapy Evaluation Patient Details Name: Gerald Alvarez. MRN: [id number] DOB: [birth year] Today's Date: 12/22/2017   History of Present Illness  Pt admitted for R hip fracture s/p IM nailing 12/22/17. History includes dementia, pancreatic cancer, DM, and prostate cancer. Of note, previous admission on 4/4 secondary to L LE DVT and cellulitis. IVC filter placed prior to ortho Sx. L heel debrided this AM by podiatry.  Clinical Impression  Pt is a pleasant 84 year old male who was admitted for R hip fx s/p IM nailing 12/22/17. Pt performs bed mobility with max assist +2, transfers with mod assist +2, and ambulation with mod assist +2. Pt demonstrates deficits with strength/mobility/pain/balance. Educated on [place] status prior to mobility performance. Would benefit from skilled PT to address above deficits and promote optimal return to PLOF; recommend transition to STR upon discharge from acute hospitalization.       Follow Up Recommendations SNF    Equipment Recommendations  None recommended by PT    Recommendations for Other Services       Precautions / Restrictions Precautions Precautions: Fall Restrictions Weight Bearing Restrictions: Yes RLE Weight Bearing: Weight bearing as tolerated      Mobility  Bed Mobility Overal bed mobility: Needs Assistance Bed Mobility: Supine to Sit     Supine to sit: Max assist;+2 for physical assistance     General bed mobility comments: needs heavy assist and cues for sequencing. Heavy posterior leaning noted, however once at EOB, able to progress to sitting with cga. Complains of pain with mobility efforts.  Transfers Overall transfer level: Needs assistance Equipment used: Rolling walker (2 wheeled);None Transfers: Sit to/from Stand Sit to Stand: Mod assist;+2 physical assistance;From elevated surface         General transfer comment: standing performed with +2 assist and upright posture, good WBing noted through B LEs. Cues  given for use of RW.   Ambulation/Gait Ambulation/Gait assistance: Mod assist;+2 physical assistance Ambulation Distance (Feet): 3 Feet Assistive device: Rolling walker (2 wheeled) Gait Pattern/deviations: Step-to pattern     General Gait Details: needs heavy cues for sequencing and taking steps, has difficulty initating with R foot. Narrow BOS.  Stairs            Wheelchair Mobility    Modified Rankin (Stroke Patients Only)       Balance Overall balance assessment: History of Falls;Needs assistance Sitting-balance support: Feet supported;Bilateral upper extremity supported Sitting balance-Leahy Scale: Fair     Standing balance support: Bilateral upper extremity supported Standing balance-Leahy Scale: Poor                               Pertinent Vitals/Pain Pain Assessment: Faces Faces Pain Scale: Hurts a little bit Pain Location: R hip Pain Descriptors / Indicators: Operative site guarding Pain Intervention(s): Limited activity within patient's tolerance;Repositioned    Home Living Family/patient expects to be discharged to:: Private residence Living Arrangements: Spouse/significant other Available Help at Discharge: Family Type of Home: House Home Access: Stairs to enter Entrance Stairs-Rails: Right Entrance Stairs-Number of Steps: 2 (from garage) Home Layout: One level Home Equipment: Shower seat - built in;Grab bars - tub/shower      Prior Function Level of Independence: Independent with assistive device(s)   Gait / Transfers Assistance Needed: Ambulates with SPC; h/o 1-2 falls in past 6 months (abrasions noted as injuries)     Comments: recently receiving PT at [facility]     Hand  Dominance        Extremity/Trunk Assessment   Upper Extremity Assessment Upper Extremity Assessment: Generalized weakness(R UE grossly 3+/5; L UE grossly 3/5; tremors noted)    Lower Extremity Assessment Lower Extremity Assessment: (R LE grossly 2/5  guarded; L LE grossly 3/5)       Communication   Communication: HOH  Cognition Arousal/Alertness: Awake/alert Behavior During Therapy: WFL for tasks assessed/performed Overall Cognitive Status: Impaired/Different from baseline                                        General Comments      Exercises Other Exercises Other Exercises: supine ther-ex on B LE including ankle pumps, SLRs, and hip abd/add. All ther-ex performed x 10 reps with min assist for performance. Safe technique. Attempted quad sets, however unable to perform due to cognition.   Assessment/Plan    PT Assessment Patient needs continued PT services  PT Problem List Decreased strength;Decreased activity tolerance;Decreased range of motion;Decreased balance;Decreased mobility;Decreased knowledge of use of DME;Decreased knowledge of precautions;Pain       PT Treatment Interventions DME instruction;Gait training;Stair training;Functional mobility training;Therapeutic activities;Therapeutic exercise;Balance training;Patient/family education    PT Goals (Current goals can be found in the Care Plan section)  Acute Rehab PT Goals Patient Stated Goal: to improve mobility PT Goal Formulation: With patient Time For Goal Achievement: 01/05/18 Potential to Achieve Goals: Good    Frequency BID   Barriers to discharge        Co-evaluation               AM-PAC PT "6 Clicks" Daily Activity  Outcome Measure Difficulty turning over in bed (including adjusting bedclothes, sheets and blankets)?: Unable Difficulty moving from lying on back to sitting on the side of the bed? : Unable Difficulty sitting down on and standing up from a chair with arms (e.g., wheelchair, bedside commode, etc,.)?: Unable Help needed moving to and from a bed to chair (including a wheelchair)?: A Lot Help needed walking in hospital room?: A Lot Help needed climbing 3-5 steps with a railing? : Total 6 Click Score: 8    End of  Session Equipment Utilized During Treatment: Gait belt Activity Tolerance: Patient tolerated treatment well Patient left: in chair;with chair alarm set;with SCD's reapplied Nurse Communication: Mobility status PT Visit Diagnosis: Unsteadiness on feet (R26.81);Other abnormalities of gait and mobility (R26.89);Muscle weakness (generalized) (M62.81);History of falling (Z91.81);Pain Pain - Right/Left: Right Pain - part of body: Hip    Time: 0388-8280 PT Time Calculation (min) (ACUTE ONLY): 27 min   Charges:   PT Evaluation $PT Eval Moderate Complexity: 1 Mod PT Treatments $Therapeutic Exercise: 8-22 mins   PT G Codes:        Greggory Stallion, PT, DPT [phone]   Addilynn Mowrer 12/22/2017, 2:13 PM

## 2017-12-22 NOTE — Progress Notes (Signed)
   Subjective: 1 Day Post-Op Procedure(s) (LRB): INTRAMEDULLARY (IM) NAIL INTERTROCHANTRIC (Right) Patient resting this am. Family member states patient has been able to rest. Pain seems to be well controlled Patient is well, and has had no acute complaints or problems Denies any CP, SOB, ABD pain. We will start therapy today.    Objective: Vital signs in last 24 hours: Temp:  [97.7 F (36.5 C)-98.8 F (37.1 C)] 98.2 F (36.8 C) (04/16 0108) Pulse Rate:  [79-114] 93 (04/16 0108) Resp:  [14-21] 16 (04/16 0108) BP: (95-139)/(49-80) 114/80 (04/16 0108) SpO2:  [89 %-100 %] 98 % (04/16 0108)  Intake/Output from previous day: 04/15 0701 - 04/16 0700 In: 1648.8 [P.O.:160; I.V.:1288.8; IV Piggyback:200] Out: 800 [Urine:700; Blood:100] Intake/Output this shift: No intake/output data recorded.  Recent Labs    12/19/17 2210 12/22/17 0433  HGB 10.7* 7.7*   Recent Labs    12/19/17 2210 12/22/17 0433  WBC 4.6 7.4  RBC 3.12* 2.23*  HCT 31.7* 23.0*  PLT 281 214   Recent Labs    12/19/17 2210 12/22/17 0433  NA 135 135  K 3.3* 3.6  CL 110 109  CO2 22 21*  BUN 18 16  CREATININE 0.72 0.56*  GLUCOSE 181* 159*  CALCIUM 8.2* 7.6*   Recent Labs    12/19/17 2210  INR 1.20    EXAM General - Patient is Alert, Appropriate and Oriented Extremity - Neurovascular intact Sensation intact distally Intact pulses distally Dorsiflexion/Plantar flexion intact  Thigh is soft Dressing - moderate drainage proximal incision site.  Motor Function - intact, moving foot and toes well on exam.   Past Medical History:  Diagnosis Date  . BPH (benign prostatic hyperplasia)   . Closed fracture of right hip ([place])   . Diabetes mellitus without complication ([place])   . Incontinence   . Nocturia   . OAB (overactive bladder)   . Pancreatic mass   . Prostate cancer ([place])     Assessment/Plan:   1 Day Post-Op Procedure(s) (LRB): INTRAMEDULLARY (IM) NAIL INTERTROCHANTRIC (Right) Active  Problems:   Closed intertrochanteric fracture of right hip, initial encounter ([place])   Acute post op blood loss anemia with underlying chronic anemia  Estimated body mass index is 22.38 kg/m as calculated from the following:   Height as of this encounter: 5\' 10"  (1.778 m).   Weight as of this encounter: 70.8 kg (156 lb). Advance diet Up with therapy  Needs BM Pain controlled. VSS Proximal incision site dressing saturated. Will apply new dressing today Acute post op blood loss anemia with underlying chronic anemia - transfuse 1 unit of PRBC and recheck Hgb in the am    DVT Prophylaxis - Foot Pumps and eliquis Weight-Bearing as tolerated to right leg   T. Rachelle Hora, PA-C [place] 12/22/2017, 7:24 AM

## 2017-12-23 LAB — GLUCOSE, CAPILLARY
GLUCOSE-CAPILLARY: 164 mg/dL — AB (ref 65–99)
GLUCOSE-CAPILLARY: 167 mg/dL — AB (ref 65–99)
GLUCOSE-CAPILLARY: 163 mg/dL — AB (ref 65–99)

## 2017-12-23 LAB — TYPE AND SCREEN
ABO/RH(D): A POS
ANTIBODY SCREEN: NEGATIVE
UNIT DIVISION: 0

## 2017-12-23 LAB — URINALYSIS, ROUTINE W REFLEX MICROSCOPIC
Bacteria, UA: NONE SEEN
Bilirubin Urine: NEGATIVE
Glucose, UA: NEGATIVE mg/dL
Ketones, ur: 5 mg/dL — AB
Nitrite: NEGATIVE
Protein, ur: 100 mg/dL — AB
SPECIFIC GRAVITY, URINE: 1.028 (ref 1.005–1.030)
pH: 5 (ref 5.0–8.0)

## 2017-12-23 LAB — CBC
HEMATOCRIT: 24.2 % — AB (ref 40.0–52.0)
HEMOGLOBIN: 8.2 g/dL — AB (ref 13.0–18.0)
MCH: 33.7 pg (ref 26.0–34.0)
MCHC: 33.9 g/dL (ref 32.0–36.0)
MCV: 99.5 fL (ref 80.0–100.0)
Platelets: 208 10*3/uL (ref 150–440)
RBC: 2.43 MIL/uL — AB (ref 4.40–5.90)
RDW: 19.1 % — ABNORMAL HIGH (ref 11.5–14.5)
WBC: 9.1 10*3/uL (ref 3.8–10.6)

## 2017-12-23 LAB — COMPREHENSIVE METABOLIC PANEL
ALK PHOS: 46 U/L (ref 38–126)
ALT: 10 U/L — ABNORMAL LOW (ref 17–63)
ANION GAP: 3 — AB (ref 5–15)
AST: 16 U/L (ref 15–41)
Albumin: 2.1 g/dL — ABNORMAL LOW (ref 3.5–5.0)
BUN: 14 mg/dL (ref 6–20)
CHLORIDE: 108 mmol/L (ref 101–111)
CO2: 24 mmol/L (ref 22–32)
Calcium: 7.5 mg/dL — ABNORMAL LOW (ref 8.9–10.3)
Creatinine, Ser: 0.51 mg/dL — ABNORMAL LOW (ref 0.61–1.24)
GFR calc non Af Amer: >60 mL/min (ref >60)
GLUCOSE: 132 mg/dL — AB (ref 65–99)
POTASSIUM: 3.7 mmol/L (ref 3.5–5.1)
SODIUM: 135 mmol/L (ref 135–145)
Total Bilirubin: 0.9 mg/dL (ref 0.3–1.2)
Total Protein: 4.8 g/dL — ABNORMAL LOW (ref 6.5–8.1)

## 2017-12-23 LAB — MAGNESIUM: Magnesium: 2 mg/dL (ref 1.7–2.4)

## 2017-12-23 LAB — BPAM RBC
Blood Product Expiration Date: 201905182359
ISSUE DATE / TIME: 201904161129
Unit Type and Rh: 6200

## 2017-12-23 LAB — PHOSPHORUS: Phosphorus: 1.5 mg/dL — ABNORMAL LOW (ref 2.5–4.6)

## 2017-12-23 MED ORDER — BISACODYL 10 MG RE SUPP: 10.0000 mg | Freq: Every day | RECTAL | Status: DC | PRN

## 2017-12-23 MED ORDER — SENNOSIDES-DOCUSATE SODIUM 8.6-50 MG PO TABS
2.0000 | ORAL_TABLET | Freq: Two times a day (BID) | ORAL | Status: DC
  Administered 2017-12-23 – 2017-12-25 (×5): 2 via ORAL
  Filled 2017-12-23 (×4): qty 2

## 2017-12-23 MED ORDER — K PHOS MONO-SOD PHOS DI & MONO 155-852-130 MG PO TABS
500.0000 mg | ORAL_TABLET | ORAL | Status: AC
  Administered 2017-12-23 (×4): 500 mg via ORAL
  Filled 2017-12-23 (×4): qty 2

## 2017-12-23 MED ORDER — BISACODYL 5 MG PO TBEC: 5.0000 mg | DELAYED_RELEASE_TABLET | Freq: Every day | ORAL | Status: DC | PRN

## 2017-12-23 NOTE — Progress Notes (Signed)
Subjective: Patient alert and eating breakfast this AM. No significant R hip pain at this time.   Objective: Vital signs in last 24 hours: Temp:  [97.9 F (36.6 C)-98.4 F (36.9 C)] 97.9 F (36.6 C) (04/17 1650) Pulse Rate:  [85-92] 86 (04/17 1650) Resp:  [18-21] 18 (04/17 1650) BP: (127-143)/(64-80) 127/64 (04/17 1650) SpO2:  [96 %-99 %] 96 % (04/17 1650)  Intake/Output from previous day: 04/16 0701 - 04/17 0700 In: 1338.8 [P.O.:420; I.V.:638.8; Blood:280] Out: 125 [Urine:125] Intake/Output this shift: Total I/O In: 360 [P.O.:360] Out: 50 [Urine:50]  Recent Labs    12/22/17 0433 12/23/17 0301  HGB 7.7* 8.2*   Recent Labs    12/22/17 0433 12/23/17 0301  WBC 7.4 9.1  RBC 2.23* 2.43*  HCT 23.0* 24.2*  PLT 214 208   Recent Labs    12/22/17 0433 12/23/17 0301  NA 135 135  K 3.6 3.7  CL 109 108  CO2 21* 24  BUN 16 14  CREATININE 0.56* 0.51*  GLUCOSE 159* 132*  CALCIUM 7.6* 7.5*   No results for input(s): LABPT, INR in the last 72 hours.  Physical Exam: RLE: Able to PF/DF ankle, wiggle toes Sensation grossly intact over foot Foot wwp Proximal incision saturated with blood, incision itself is intact w/o erythema. Distal two incisions c/d/i  Assessment: POD#2 s/p R cephalomedullary nailing  Plan: 1. WBAT RLE 2. Continue PT/OT 3. Proximal dressing changed. Can change as needed.  4. Continue Eliquis for anticoagulation for LLE DVT.  5. No further need for transfusion at this point 6. Will follow peripherally while inpatient. Please page with any questions. 7. F/U with me as outpatient in ~2 weeks.    Leim Fabry 12/23/2017, 5:08 PM

## 2017-12-23 NOTE — Progress Notes (Signed)
[facility] NAME: Gerald Alvarez    MR#:  [id number]  DATE OF BIRTH:  [birth year]  SUBJECTIVE:  Some concern for bruising and confusion per family. REVIEW OF SYSTEMS:   Review of Systems  Unable to perform ROS: Dementia   Tolerating Diet:yes Tolerating PT: STR/SNF DRUG ALLERGIES:   Allergies  Allergen Reactions  . Sulfa Antibiotics Nausea And Vomiting    VITALS:  Blood pressure 127/64, pulse 86, temperature 97.9 F (36.6 C), temperature source Oral, resp. rate 18, height 5\' 10"  (1.778 m), weight 70.8 kg (156 lb), SpO2 96 %.  PHYSICAL EXAMINATION:   Physical Exam  GENERAL:  84 y.o.-year-old patient lying in the bed with no acute distress.  EYES: Pupils equal, round, reactive to light and accommodation. No scleral icterus. Extraocular muscles intact.  HEENT: Head atraumatic, normocephalic. Oropharynx and nasopharynx clear.  NECK:  Supple, no jugular venous distention. No thyroid enlargement, no tenderness.  LUNGS: Normal breath sounds bilaterally, no wheezing, rales, rhonchi. No use of accessory muscles of respiration.  CARDIOVASCULAR: S1, S2 normal. No murmurs, rubs, or gallops.  ABDOMEN: Soft, nontender, nondistended. Bowel sounds present. No organomegaly or mass.  EXTREMITIES: No cyanosis, clubbing or edema b/l. Rt leg surgery site with blood/brusing NEUROLOGIC: Cranial nerves II through XII are intact. No focal Motor or sensory deficits b/l.   PSYCHIATRIC:  patient is alert  SKIN: No obvious rash, lesion, or ulcer.  LABORATORY PANEL:  CBC Recent Labs  Lab 12/23/17 0301  WBC 9.1  HGB 8.2*  HCT 24.2*  PLT 208    Chemistries  Recent Labs  Lab 12/23/17 0301  NA 135  K 3.7  CL 108  CO2 24  GLUCOSE 132*  BUN 14  CREATININE 0.51*  CALCIUM 7.5*  MG 2.0  AST 16  ALT 10*  ALKPHOS 46  BILITOT 0.9   Cardiac Enzymes No results for input(s): TROPONINI in the last 168 hours. RADIOLOGY:  No results  found. ASSESSMENT AND PLAN:  Gerald Alvarez  is a 84 y.o. 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  1.) Intertroch femur Fx: s/p repair POD 3 - d/w Dr Leim Fabry - s/p IVC filter on lt side considering high risk for thromboembolism by vascular  2.) LLE DVT: Hold Eliquis, as above. - Patient has short segment popliteal left DVT.  - Resumed eliquis  - s/p IVC filter placement before ortho surgery  3.) LLE cellulitis: c/w Augmentin (stop date 12/24/2017), topical Bacitracin + Triamcinolone. Wound consult. -Last dose for Augmentin is 12/24/17 - seen by Podiatry on this admission. No further recommendation. outpt f/up  4.) Hypokalemia: K+ 3.6. Repleted and resolved  5.) IDDM:  - Continue sliding scale insulin  6.) Pancreatic Ca: poor prognosis  7.) BPH: c/w home Doxazosin.  8.) Anx/dep: c/w home Xanax, Cymbalta, Hydroxyzine, Zoloft.  9.) FEN/GI: Diabetic diet, C/w home Protonix.  10.) DVT PPx: on Eliquis  11.) Code Status: Full code per family but now they're requesting Palliative care eval  D/w daughter Gerald Alvarez and patient's wife   Case discussed with Care Management/Social Worker. Management plans discussed with the patient,  family and they are in agreement.  CODE STATUS: full  DVT Prophylaxis: TEDS and SCD  TOTAL TIME TAKING CARE OF THIS PATIENT: *  30* minutes.  >50% time spent on counselling and coordination of care  POSSIBLE D/C IN *2-3* DAYS, DEPENDING ON CLINICAL CONDITION.  Note: This dictation was prepared with Dragon dictation along with smaller phrase technology. Any transcriptional errors that result from this process are unintentional.  Max Sane M.D on 12/23/2017 at 10:41 PM  Between 7am to 6pm - Pager - [phone]  After 6pm go to  [email or web address] - Proofreader  [facility]  Office  [phone]  CC: Primary care physician; Gerald Alvarez, MDPatient ID: Gerald Alvarez., male   DOB: Jun 20, 1926, 84 y.o.   MRN: [id number]

## 2017-12-23 NOTE — Progress Notes (Signed)
OT Cancellation Note  Patient Details Name: Gerald Alvarez. MRN: [id number] DOB: Oct 09, 1925   Cancelled Treatment:    Reason Eval/Treat Not Completed: Fatigue/lethargy limiting ability to participate. Upon attempt to evaluate, pt recently back to bed after 2nd PT session, sleeping soundly. Spouse and brother present in room. Spouse reports that pt has been increasingly fatigued this afternoon and confused. Requesting OT hold evaluation this afternoon and re-attempt next morning. Will plan to re-attempt next date in morning as pt is available and medically appropriate.   Jeni Salles, MPH, MS, OTR/L ascom [phone] 12/23/17, 3:37 PM

## 2017-12-23 NOTE — Progress Notes (Signed)
Family Meeting Note  Advance Directive:yes  Today a meeting took place with the Patient and Daughter and wife.   The following clinical team members were present during this meeting:MD  The following were discussed:Patient's diagnosis:    JamesOakleyis a89 y.o.malewith a known history of stage IIa pancreatic head adenoCa (s/p CBD stent, now on chemoTx), pancytopenia, Hx prostate Ca (s/p surgery + XRT), IDDM, BPH, OAB, nephrolithiasis, Anx/Dep who p/w R hip fracture s/p repair, on Eliquis for recent LLE DVT. He is getting more confused and delirius per family.   Patient's progosis: < 12 months and Goals for treatment: Full Code  Additional follow-up to be provided: Family requests and agreeable for Palliative care c/s - consider DNR and may be Palliative care services at facility (peak)  Time spent during discussion:20 minutes  Max Sane, MD

## 2017-12-23 NOTE — Progress Notes (Signed)
Physical Therapy Treatment Patient Details Name: Gerald Alvarez. MRN: [id number] DOB: 06/19/1926 Today's Date: 12/23/2017    History of Present Illness Pt admitted for R hip fracture s/p IM nailing 12/22/17. History includes dementia, pancreatic cancer, DM, and prostate cancer. Of note, previous admission on 4/4 secondary to L LE DVT and cellulitis. IVC filter placed prior to ortho Sx. L heel debrided 12/22/17 AM by podiatry.    PT Comments    Pt requiring 2 assist for supine to sit, sit to stand with RW (x3 trials), and ambulating a few feet bed to recliner with RW.  Pt with difficulty advancing R LE compared to L LE but appeared limited with steps d/t R hip pain and fatigue with activity.  Will continue to progress activity per pt tolerance.   Follow Up Recommendations  SNF     Equipment Recommendations  Rolling walker with 5" wheels;3in1 (PT)    Recommendations for Other Services       Precautions / Restrictions Precautions Precautions: Fall Precaution Comments: R chest port Restrictions Weight Bearing Restrictions: Yes RLE Weight Bearing: Weight bearing as tolerated    Mobility  Bed Mobility Overal bed mobility: Needs Assistance Bed Mobility: Supine to Sit     Supine to sit: Mod assist;Max assist;+2 for physical assistance;HOB elevated     General bed mobility comments: assist for trunk and B LE's supine to sit  Transfers Overall transfer level: Needs assistance Equipment used: Rolling walker (2 wheeled) Transfers: Sit to/from Stand Sit to Stand: Mod assist;+2 physical assistance         General transfer comment: x3 trials sit to stand (pt requesting to use urinal in standing); vc's for LE and UE positioning; assist to initiate stand  Ambulation/Gait Ambulation/Gait assistance: Mod assist;+2 physical assistance Ambulation Distance (Feet): 3 Feet Assistive device: Rolling walker (2 wheeled)   Gait velocity: decreased   General Gait Details: vc's for  stepping technique and use of RW; pt with difficulty advancing R LE but able to take step backward with R LE; pt able to take a few steps with L LE but limited d/t R hip pain   Stairs             Wheelchair Mobility    Modified Rankin (Stroke Patients Only)       Balance Overall balance assessment: History of Falls;Needs assistance Sitting-balance support: Feet supported;Bilateral upper extremity supported Sitting balance-Leahy Scale: Poor Sitting balance - Comments: intermittent posterior lean in sitting requiring assist for upright (otherwise CGA for safety)   Standing balance support: Bilateral upper extremity supported(on RW) Standing balance-Leahy Scale: Poor Standing balance comment: assist for upright d/t posterior lean                            Cognition Arousal/Alertness: Awake/alert Behavior During Therapy: WFL for tasks assessed/performed Overall Cognitive Status: Impaired/Different from baseline(Oriented to self)                                        Exercises      General Comments General comments (skin integrity, edema, etc.): Pt resting in bed upon PT arrival; pt's daughter present during session and wife arrived during session.   Nursing cleared pt for participation in physical therapy.  Pt agreeable to PT session.     Pertinent Vitals/Pain Pain Assessment: Faces Faces Pain Scale: Hurts  a little bit(6/10 with mobility; 2/10 at rest) Pain Location: R hip Pain Descriptors / Indicators: Operative site guarding;Grimacing Pain Intervention(s): Limited activity within patient's tolerance;Monitored during session;Premedicated before session;Repositioned;Ice applied  Vitals (HR and O2 on room air) stable and WFL throughout treatment session.    Home Living                      Prior Function            PT Goals (current goals can now be found in the care plan section) Acute Rehab PT Goals Patient Stated Goal: to  improve mobility PT Goal Formulation: With patient Time For Goal Achievement: 01/05/18 Potential to Achieve Goals: Good Additional Goals Additional Goal #1: Pt will be able to perform bed mobility/transfer with cga and safe technique in order to improve functional independence Progress towards PT goals: Progressing toward goals    Frequency    BID      PT Plan Current plan remains appropriate    Co-evaluation              AM-PAC PT "6 Clicks" Daily Activity  Outcome Measure  Difficulty turning over in bed (including adjusting bedclothes, sheets and blankets)?: Unable Difficulty moving from lying on back to sitting on the side of the bed? : Unable Difficulty sitting down on and standing up from a chair with arms (e.g., wheelchair, bedside commode, etc,.)?: Unable Help needed moving to and from a bed to chair (including a wheelchair)?: Total Help needed walking in hospital room?: Total Help needed climbing 3-5 steps with a railing? : Total 6 Click Score: 6    End of Session Equipment Utilized During Treatment: Gait belt Activity Tolerance: Patient tolerated treatment well Patient left: in chair;with call bell/phone within reach;with chair alarm set;with family/visitor present;Other (comment)(B heels elevated via pillow) Nurse Communication: Mobility status;Precautions;Weight bearing status PT Visit Diagnosis: Unsteadiness on feet (R26.81);Other abnormalities of gait and mobility (R26.89);Muscle weakness (generalized) (M62.81);History of falling (Z91.81);Pain Pain - Right/Left: Right Pain - part of body: Hip     Time: 1572-6203 PT Time Calculation (min) (ACUTE ONLY): 38 min  Charges:  $Therapeutic Activity: 38-52 mins                    G CodesLeitha Bleak, PT 12/23/17, 2:19 PM [phone]

## 2017-12-23 NOTE — Progress Notes (Addendum)
Pharmacy Electrolyte Monitoring Consult:  Pharmacy consulted to assist in monitoring and replacing electrolytes in this 84 y.o. male admitted on 12/19/2017 with Fall  Patient ordered potassium 90mEq PO x 1. Patient ordered spironolactone 25mg  PO Daily.   Labs:  Sodium (mmol/L)  Date Value  12/23/2017 135   Potassium (mmol/L)  Date Value  12/23/2017 3.7   Magnesium (mg/dL)  Date Value  12/20/2017 2.1   Calcium (mg/dL)  Date Value  12/23/2017 7.5 (L)   Albumin (g/dL)  Date Value  12/23/2017 2.1 (L)    Plan: Electrolytes WNL this morning. No supplementation needed.  CMP ordered with AM labs.   4/17 K 3.7, Ca 7.5, albumin 2.1, adjusted Ca 9.02. Will add-on magnesium and phos.  Add-on Mg 2, phos 1.5. Give K phos neutra tab 500 mg po Q4H x 4 doses. Recheck all electrolytes tomorrow with AM labs.   Pharmacy will continue to monitor and adjust per consult.   Laural Benes, PharmD, BCPS Clinical Pharmacist 12/23/2017 8:26 AM

## 2017-12-23 NOTE — Progress Notes (Signed)
Physical Therapy Treatment Patient Details Name: Gerald Alvarez. MRN: [id number] DOB: Apr 01, 1926 Today's Date: 12/23/2017    History of Present Illness Pt admitted for R hip fracture s/p IM nailing 12/22/17. History includes dementia, pancreatic cancer, DM, and prostate cancer. Of note, previous admission on 4/4 secondary to L LE DVT and cellulitis. IVC filter placed prior to ortho Sx. L heel debrided 12/22/17 AM by podiatry.    PT Comments    Pt demonstrating R lateral lean in standing requiring increased assist to maintain upright (pt appeared to be decreasing WB'ing through R LE d/t R hip pain) so pt assisted back into sitting and then performed stand pivot recliner to bed (towards L side) of which pt tolerated fairly well.  Overall pt appearing fatigued during session limiting sessions activities.  Will continue to progress pt with strengthening and progress functional mobility per pt tolerance.    Follow Up Recommendations  SNF     Equipment Recommendations  Rolling walker with 5" wheels;3in1 (PT)    Recommendations for Other Services       Precautions / Restrictions Precautions Precautions: Fall Precaution Comments: R chest port Restrictions Weight Bearing Restrictions: Yes RLE Weight Bearing: Weight bearing as tolerated    Mobility  Bed Mobility Overal bed mobility: Needs Assistance Bed Mobility: Sit to Supine     Sit to supine: Mod assist;+2 for physical assistance   General bed mobility comments: assist for trunk and B LE's sit to supine  Transfers Overall transfer level: Needs assistance Equipment used: Rolling walker (2 wheeled) Transfers: Sit to/from [facility] Sit to Stand: Mod assist;Max assist;+2 physical assistance Stand pivot transfers: Mod assist;+2 physical assistance       General transfer comment: x1 trial sit to stand (upon standing pt leaning to R side and pt unable to maintain correction with vc's and assist; mod assist to  maintain upright d/t R lean); stand pivot recliner to bed with mod assist x2 towards L side with vc's for technique  Ambulation/Gait   General Gait Details: pt unable to maintain upright to attempt to advance LE's so deferred gait   Stairs             Wheelchair Mobility    Modified Rankin (Stroke Patients Only)       Balance Overall balance assessment: History of Falls;Needs assistance Sitting-balance support: Feet supported;Bilateral upper extremity supported Sitting balance-Leahy Scale: Poor Sitting balance - Comments: intermittent posterior lean in sitting requiring assist for upright (otherwise CGA for safety)   Standing balance support: Bilateral upper extremity supported(on RW) Standing balance-Leahy Scale: Poor Standing balance comment: assist for upright d/t R lean                            Cognition Arousal/Alertness: Awake/alert Behavior During Therapy: WFL for tasks assessed/performed Overall Cognitive Status: Impaired/Different from baseline(Oriented to self)                                        Exercises      General Comments General comments (skin integrity, edema, etc.): Pt resting in chair upon PT arrival; pt's wife present during session.  Pt agreeable to session.      Pertinent Vitals/Pain Pain Assessment: Faces Faces Pain Scale: Hurts a little bit(2/10 at rest; 6/10 with activity) Pain Location: R hip Pain Descriptors / Indicators: Operative site  guarding;Grimacing Pain Intervention(s): Limited activity within patient's tolerance;Monitored during session;Premedicated before session;Repositioned  Vitals (HR and O2 on room air) stable and WFL throughout treatment session.    Home Living                      Prior Function            PT Goals (current goals can now be found in the care plan section) Acute Rehab PT Goals Patient Stated Goal: to improve mobility PT Goal Formulation: With  patient Time For Goal Achievement: 01/05/18 Potential to Achieve Goals: Good Additional Goals Additional Goal #1: Pt will be able to perform bed mobility/transfer with cga and safe technique in order to improve functional independence Progress towards PT goals: Progressing toward goals    Frequency    BID      PT Plan Current plan remains appropriate    Co-evaluation              AM-PAC PT "6 Clicks" Daily Activity  Outcome Measure  Difficulty turning over in bed (including adjusting bedclothes, sheets and blankets)?: Unable Difficulty moving from lying on back to sitting on the side of the bed? : Unable Difficulty sitting down on and standing up from a chair with arms (e.g., wheelchair, bedside commode, etc,.)?: Unable Help needed moving to and from a bed to chair (including a wheelchair)?: Total Help needed walking in hospital room?: Total Help needed climbing 3-5 steps with a railing? : Total 6 Click Score: 6    End of Session Equipment Utilized During Treatment: Gait belt Activity Tolerance: Patient limited by fatigue Patient left: in bed;with call bell/phone within reach;with bed alarm set;with family/visitor present;with SCD's reapplied;Other (comment)(B heels elevated via pillow; bed in lowest position) Nurse Communication: Mobility status;Precautions;Weight bearing status PT Visit Diagnosis: Unsteadiness on feet (R26.81);Other abnormalities of gait and mobility (R26.89);Muscle weakness (generalized) (M62.81);History of falling (Z91.81);Pain Pain - Right/Left: Right Pain - part of body: Hip     Time: 1340-1403 PT Time Calculation (min) (ACUTE ONLY): 23 min  Charges:  $Therapeutic Activity: 23-37 mins                    G CodesLeitha Bleak, PT 12/23/17, 2:41 PM [phone]

## 2017-12-23 NOTE — Progress Notes (Signed)
[name] family at bedside, daughter verbalized about a big bruise that "she just found" on the pt's inner right thigh. Pt had right hip IM nailing done 2 days ago and taking Eliquis. Ice pack applied, will continue to monitor.

## 2017-12-23 NOTE — Care Management Important Message (Signed)
Important Message  Patient Details  Name: Gerald Alvarez. MRN: [id number] Date of Birth: 11/02/1925   Medicare Important Message Given:  Yes    Juliann Pulse A Dakoda Bassette 12/23/2017, 11:29 AM

## 2017-12-24 ENCOUNTER — Inpatient Hospital Stay: Payer: Medicare Other

## 2017-12-24 DIAGNOSIS — S72141A Displaced intertrochanteric fracture of right femur, initial encounter for closed fracture: Principal | ICD-10-CM

## 2017-12-24 LAB — CBC
HEMATOCRIT: 26.5 % — AB (ref 40.0–52.0)
Hemoglobin: 8.9 g/dL — ABNORMAL LOW (ref 13.0–18.0)
MCH: 34 pg (ref 26.0–34.0)
MCHC: 33.8 g/dL (ref 32.0–36.0)
MCV: 100.7 fL — AB (ref 80.0–100.0)
Platelets: 237 10*3/uL (ref 150–440)
RBC: 2.63 MIL/uL — ABNORMAL LOW (ref 4.40–5.90)
RDW: 18.6 % — AB (ref 11.5–14.5)
WBC: 8.1 10*3/uL (ref 3.8–10.6)

## 2017-12-24 LAB — GLUCOSE, CAPILLARY
GLUCOSE-CAPILLARY: 150 mg/dL — AB (ref 65–99)
Glucose-Capillary: 126 mg/dL — ABNORMAL HIGH (ref 65–99)
Glucose-Capillary: 136 mg/dL — ABNORMAL HIGH (ref 65–99)
Glucose-Capillary: 182 mg/dL — ABNORMAL HIGH (ref 65–99)
Glucose-Capillary: 97 mg/dL (ref 65–99)

## 2017-12-24 LAB — MAGNESIUM: Magnesium: 2 mg/dL (ref 1.7–2.4)

## 2017-12-24 LAB — PHOSPHORUS: PHOSPHORUS: 2.5 mg/dL (ref 2.5–4.6)

## 2017-12-24 MED ORDER — VANCOMYCIN HCL 10 G IV SOLR
1250.0000 mg | Freq: Once | INTRAVENOUS | Status: AC
  Administered 2017-12-24: 1250 mg via INTRAVENOUS
  Filled 2017-12-24: qty 1250

## 2017-12-24 MED ORDER — PIPERACILLIN-TAZOBACTAM 3.375 G IVPB
3.3750 g | Freq: Three times a day (TID) | INTRAVENOUS | Status: DC
  Administered 2017-12-24 – 2017-12-25 (×2): 3.375 g via INTRAVENOUS
  Filled 2017-12-24 (×2): qty 50

## 2017-12-24 MED ORDER — DIPHENHYDRAMINE HCL 25 MG PO CAPS
25.0000 mg | ORAL_CAPSULE | Freq: Three times a day (TID) | ORAL | Status: DC | PRN
  Administered 2017-12-25: 25 mg via ORAL
  Filled 2017-12-24: qty 1

## 2017-12-24 NOTE — Progress Notes (Addendum)
OT Cancellation Note  Patient Details Name: Gerald Alvarez. MRN: [id number] DOB: 09/24/1925   Cancelled Treatment:    Reason Eval/Treat Not Completed: Other (comment). MD and RN in room for extended period of time, family members outside room.  Spoke with pt's daughter, noting pt not appropriate for OT at this time. Pt reportedly did not sleep well. Per chart review, palliative consult now pending. Will re-attempt OT evaluation at later date/time as pt is available and medically appropriate.   Jeni Salles, MPH, MS, OTR/L ascom [phone] 12/24/17, 9:29 AM

## 2017-12-24 NOTE — Progress Notes (Signed)
[facility] NAME: Gerald Alvarez    MR#:  [id number]  DATE OF BIRTH:  [birth year]  SUBJECTIVE:   patient was confused last night.  Daughter and wife at bedside.  Leaning towards palliative care and probably hospice, would like to discuss with palliative care REVIEW OF SYSTEMS:   Review of Systems  Unable to perform ROS: Dementia   Tolerating Diet:yes Tolerating PT: STR/SNF DRUG ALLERGIES:   Allergies  Allergen Reactions  . Sulfa Antibiotics Nausea And Vomiting    VITALS:  Blood pressure 117/61, pulse (!) 106, temperature 98.1 F (36.7 C), resp. rate 19, height 5\' 10"  (1.778 m), weight 70.8 kg (156 lb), SpO2 98 %.  PHYSICAL EXAMINATION:   Physical Exam  GENERAL:  84 y.o.-year-old patient lying in the bed with no acute distress.  EYES: Pupils equal, round, reactive to light and accommodation. No scleral icterus. Extraocular muscles intact.  HEENT: Head atraumatic, normocephalic. Oropharynx and nasopharynx clear.  NECK:  Supple, no jugular venous distention. No thyroid enlargement, no tenderness.  LUNGS: Moderate breath sounds bilaterally, no wheezing, rales, rhonchi. No use of accessory muscles of respiration.  CARDIOVASCULAR: S1, S2 normal. No murmurs, rubs, or gallops.  ABDOMEN: Soft, nontender, nondistended. Bowel sounds present. No organomegaly or mass.  EXTREMITIES: No cyanosis, clubbing or edema b/l. Rt leg surgery site with blood/brusing NEUROLOGIC: Cranial nerves II through XII are intact. No focal Motor or sensory deficits b/l.   PSYCHIATRIC:  patient is alert  SKIN: No obvious rash, lesion, or ulcer.  LABORATORY PANEL:  CBC Recent Labs  Lab 12/24/17 0427  WBC 8.1  HGB 8.9*  HCT 26.5*  PLT 237    Chemistries  Recent Labs  Lab 12/24/17 0427  NA 136  K 3.7  CL 107  CO2 25  GLUCOSE 165*  BUN 15  CREATININE 0.52*  CALCIUM 7.5*  MG 2.0  AST 19  ALT 12*  ALKPHOS 51  BILITOT 0.9   Cardiac  Enzymes No results for input(s): TROPONINI in the last 168 hours. RADIOLOGY:  Dg Chest Port 1 View  Result Date: 12/24/2017 CLINICAL DATA:  New onset cough. EXAM: PORTABLE CHEST 1 VIEW COMPARISON:  12/19/2017. FINDINGS: PowerPort catheter with lead tip noted over the right atrium. Cardiomegaly with normal pulmonary vascularity. Low lung volumes with mild basilar atelectasis/infiltrates. Surgical clips right upper quadrant. Vascular stent and IVC filter noted over the right abdomen. IMPRESSION: 1.  PowerPort catheter with tip over the superior vena cava. 2.  Low lung volumes with mild bibasilar atelectasis/infiltrates. Electronically Signed   By: Marcello Moores  Register   On: 12/24/2017 10:25   ASSESSMENT AND PLAN:  Gerald Alvarez  is a 84 y.o. 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  1.) Intertroch femur Fx: s/p repair - d/w Dr Leim Fabry - s/p IVC filter on lt side considering high risk for thromboembolism by vascular  2.) LLE DVT: - Patient has short segment popliteal left DVT.  - Resumed eliquis  - s/p IVC filter placement before ortho surgery  3.) LLE cellulitis: c/w Augmentin (stop date 12/24/2017), topical Bacitracin + Triamcinolone. Wound consult. -Last dose for Augmentin is 12/24/17 - seen by Podiatry on this admission. No further recommendation. outpt f/up  4.)  Acute encephalopathy -multifactorial ; sundowning  and probable healthcare associated pneumonia  Chest x-ray with possible atelectasis versus infiltrate Patient is started on broad-spectrum antibiotics, will change to p.o. Augmentin at the time of discharge  5.) IDDM:  - Continue sliding scale insulin  6.) Pancreatic Ca: poor prognosis, refusing chemotherapy  7.)  Sundowning syndrome: Recommended family members to stay  with the patient during the nighttime to reorient the patient.  Benadryl as needed will be given, family members reported that trazodone is not helping patient  8.) Anx/dep: c/w home Xanax, Cymbalta, Hydroxyzine, Zoloft.  9.) FEN/GI: Diabetic diet, C/w home Protonix.  10.) DVT PPx: on Eliquis  11.) Code Status: DO NOT RESUSCITATE, family has decided to take patient home with hospice care at home after my initial discussion and further discussions with palliative care  D/w daughter Angela Nevin and patient's wife   Case discussed with Care Management/Social Worker. Management plans discussed with the patient, daughter and wife at bedside and they are in agreement.  CODE STATUS: full  DVT Prophylaxis: TEDS and SCD  TOTAL TIME TAKING CARE OF THIS PATIENT: 36 minutes.  >50% time spent on counselling and coordination of care  POSSIBLE D/C IN *2-3* DAYS, DEPENDING ON CLINICAL CONDITION.  Note: This dictation was prepared with Dragon dictation along with smaller phrase technology. Any transcriptional errors that result from this process are unintentional.  Nicholes Mango M.D on 12/24/2017 at 9:00 PM  Between 7am to 6pm - Pager - [phone]  After 6pm go to [email or web address] - Proofreader  [facility]  Office  [phone]  CC: Primary care physician; Rusty Aus, MDPatient ID: Gerald Angst., male   DOB: Sep 02, 1926, 84 y.o.   MRN: [id number]

## 2017-12-24 NOTE — Clinical Social Work Note (Addendum)
CSW was informed that palliative was going to meet with patient's family to discuss goals of care.  Patient has a bed at [facility] for rehab if patient's family decides that is what they want to do.  CSW to continue to follow patient's progress throughout discharge planning.  5:30pm  CSW was informed by palliative that patient's family have decided they want to have patient go home with hospice.  CSW updated case manager and [facility].  CSW to sign off please reconsult if other social work needs arise.  Jones Broom. [place], MSW, [place]  12/24/2017 4:53 PM

## 2017-12-24 NOTE — Progress Notes (Signed)
Pharmacy Electrolyte Monitoring Consult:  Pharmacy consulted to assist in monitoring and replacing electrolytes in this 84 y.o. male admitted on 12/19/2017 with Fall  Patient ordered potassium 49mEq PO x 1. Patient ordered spironolactone 25mg  PO Daily.   Labs:  Sodium (mmol/L)  Date Value  12/24/2017 136   Potassium (mmol/L)  Date Value  12/24/2017 3.7   Magnesium (mg/dL)  Date Value  12/24/2017 2.0   Phosphorus (mg/dL)  Date Value  12/24/2017 2.5   Calcium (mg/dL)  Date Value  12/24/2017 7.5 (L)   Albumin (g/dL)  Date Value  12/24/2017 2.2 (L)    Plan: Electrolytes WNL this morning. No supplementation needed.   Will recheck electrolytes in 48 hours and will continue to monitor and replace as needed per consult.   Pernell Dupre, PharmD, BCPS Clinical Pharmacist 12/24/2017 7:36 AM

## 2017-12-24 NOTE — Progress Notes (Signed)
Chaplain was rounding and checking on Palliative patients. Wife was at bedside. Daughter was in room earlier. Chaplain spoke with daughter and verified visitation. Chaplain offered support and prayed for Pt for God's will to be done. Pt will be discharged.    12/24/17 1100  Clinical Encounter Type  Visited With Patient and family together  Visit Type Initial;Critical Care  Referral From Nurse  Spiritual Encounters  Spiritual Needs Prayer

## 2017-12-24 NOTE — Consult Note (Addendum)
Consultation Note Date: 12/24/2017   Patient Name: Gerald Alvarez.  DOB: Oct 25, 1925  MRN: [id number]  Age / Sex: 84 y.o., male  PCP: Rusty Aus, MD Referring Physician: Nicholes Mango, MD  Reason for Consultation: Establishing goals of care  HPI/Patient Profile: 84 y.o. male admitted on 12/19/2017 from [facility] with complaints of unwitnessed fall. Per EMS facility reported patient was found on the floor. He has a past medical history significant for stage IIa pancreatic head adenocarcinoma (s/p CBD stent, now on chemoTx), pancytopenia, Hx prostate Ca (s/p surgery + XRT), IDDM, BPH, nephrolithiasis, and overactive bladder. Hx was obtained from pt's daughter at bedside. Other family members were also present. He was admitted on 4/4 and started on Eliquis within the last week for LLE DVT, and was subsequently discharged for rehabilitation at [facility]). The patient was unable to remember why he had tried to get out of bed. XR R hip (+) acute displaced R femur intertrochanteric fracture w/o dislocation. The pt denied head injury, and CT head and CT C-spine done in ED do not demonstrate any acute changes. Since admission patient underwent surgical correction of right hip fracture on 4/15.   The patient's daughter states the pt lives at home with his wife. He lives on a single floor, though they note there are several steps to get into the house. He ambulates with a cane. They state the pt was very independent and of sound mind up until ~1.5wks ago, when he was hospitalized w/ LLE DVT + LLE cellulitis. They note he has been intermittently confused and has had difficulty with short-term recall since that time. They deny a history of dementia though. They state he has fallen 1-2x in the past, but he does not have a recent history of recurrent falls. The pt endorsed R hip pain in the ED, worse with movement and  position change, but was otherwise without complaint. The pt did not complain of F/C/N/V/D/AP, CP/SOB, palpitations, diaphoresis, LH/LOC, urinary symptoms. [name] wife is DPOA, and his family states he desires to be full code.  Clinical Assessment and Goals of Care: I have reviewed medical records including lab results, imaging, Epic notes, and MAR, received report from the bedside RN, and assessed the patient. I then met at the bedside along with Gerald Alvarez (spouse), Gerald Alvarez and husband (daughter) and Gerald Alvarez (daughter) to discuss diagnosis prognosis, [place], EOL wishes, disposition and options. Patient is in bed alert to self and family only. He is unable to fully engage and comprehend goals of care discussion.   I introduced Palliative Medicine as specialized medical care for people living with serious illness. It focuses on providing relief from the symptoms and stress of a serious illness. The goal is to improve quality of life for both the patient and the family.  We discussed a brief life review of the patient.  She and wife have been married for over 19 years.  They have 2 daughters, Gerald Alvarez and Gerald Alvarez.  Family states patient served in the [employer] for about  10 years.  He is a man of [place] and his family is very important to him.  He retired after working more than 36 years.  Wife states he wanted to be at home and spend his Armand[place] years with her.  Mr. Skare love to be outside and working in the garden or doing yard work.  As far as functional and nutritional status family reports noticing a decline in his health over the past 9 months.  He was a very independent man, and they noticed around September or October of last year he started to become weak.  Family reports going to his family doctor for routine testing due to increased weakness, only to discover his blood sugars were greater than 500.  At this time he was then placed on insulin for diabetes. Family reports he was somewhat more active afterwards and  actually scraped the driveway when it snowed back in December.  Unfortunately in later December early January he began to turn jaundice and was experiencing some dizziness which warranted further medical workup.  This is when they discovered the pancreatic cancer.  They also noticed a decrease in his appetite.  Family reports more of a severe decline since the initiation of chemotherapy.  Patient has become weaker, appetite has continued to decline, and more recently patient has become increasingly confused. Family also reports other complications such as DVT and cellulitis.  Her entire admission he was at [facility] for rehabilitation due to deconditioning.  Family states even when visiting at the facility they began to notice and increase in confusion and patient continuously trying to get out of bed or wheelchair unassisted.  He was however, participating in physical therapy and ambulating well with assistive devices and supervision.   We discussed his current illness and what it means in the larger context of his on-going co-morbidities.  Natural disease trajectory and expectations at EOL were discussed.  I attempted to elicit values and goals of care important to the patient.  Family became very tearful during conversation and stated they had actually began to have this conversation amongst each other.  The difference between aggressive medical intervention and comfort care was considered in light of the patient's goals of care.  Family all verbalized at this point they did not think that they wanted to proceed with further chemotherapy treatments.  They felt that since his treatments he had continued to decline.  They also verbalized knowing that treatments were not curative.  Wife tearfully stated, that if patient was alert and of sound mind he would say that he did not want to continue his life or treatments in his current health state. They verbalized their disinterest in further aggressive workup  related to his cancer,  knowing that the outcome and findings would not make a difference or that they would pursue aggressive treatments based on findings.  They feel that he is too weak and it would be more stress to attempt to get him to chemo appointments, and they were familiar with his response to treatments (sleeping all day, no appetite, and not wanting to get out of bed) in the past and at that time he was in a better state of health than now.  Family also verbalized that they felt Mr. Grandstaff would not be able to participate in aggressive physical therapy at a rehab facility.  Advanced directives, concepts specific to code status, artifical feeding and hydration, and rehospitalization were considered and discussed.  At this time family has decided  for patient to be a DNR/DNI.  Wife stated he had verbalize in the past to allow him to go naturally.  She also stated that they had recently looked at his will since being hospitalized and he specified that he did not want any life-sustaining measures and to be allowed a natural death process.  Family also were verbalized their wishes of no artificial feedings such as PEG tubes.  Hospice and Palliative Care services outpatient were explained and offered.  Family verbalized understanding the goals and descriptions of both hospice and palliative care services.  As a family they decided that it was most important for him to be in the home and have the best quality of life with the time that he has left.  They feel that at this time knowing they have decided to discontinue chemotherapy treatments and other aggressive measures that having hospice services in their home would be most appropriate.  Family verbalized understanding that hospice services would be focused on allowing patient to feel as good as he can for as long as he can in his own home with focus on aggressive symptom management.  They understand that comfort measures include managing symptoms such as  shortness of breath, pain, agitation and anxiety, constipation, nausea, and vomiting etc.  Family discussed amongst themselves what I was present and agreed that this is what they wanted.  I offered to allow them some time to think over their decisions during the night, however they looked at each other and verbalized this is what he would want and this is also what they would like and were comfortable with this decision.  Questions and concerns were addressed.  The family was encouraged to call with questions or concerns.  PMT will continue to support holistically.   HCPOA -  Gerald Alvarez (Wife) with collaboration with daughters (Gerald Alvarez and Gerald Alvarez)   SUMMARY OF RECOMMENDATIONS    DNR/DNI-at patient's family request (DNR completed)  Continue to treat the treatable while hospitalized at family's request.  Case management consult for outpatient hospice services.  Raquel Sarna has decided to discontinue with any further aggressive treatments such as rehabilitation or chemotherapy.  Agree with Zofran as needed for nausea as ordered by attending.  Agree with tramadol and/or oxycodone as needed for pain as ordered by attendings.  Agree with Senokot and Dulcolax as needed for constipation.  Agree with Xanax and trazodone at bedtime for anxiety and insomnia as ordered by attending.  Palliative medicine team will continue to support patient, patient's family, and medical team during hospitalization.  Per family and nursing staff patient was quite agitated and restless during the night.  Family is going to have someone stay with him on tonight but requesting as needed medication if he becomes restless or agitated.  Staff and family feels Xanax and trazodone did not help patient during the night. Discussed with Dr. Margaretmary Eddy and agreed to have Benadryl 40m every 8hr PRN for agitation.   Code Status/Advance Care Planning:  DNR/DNI at family's request  Palliative Prophylaxis:   Aspiration, Bowel Regimen,  Delirium Protocol, Frequent Pain Assessment, Oral Care and Turn Reposition  Additional Recommendations (Limitations, Scope, Preferences):  Full Scope Treatment per family continue to treat the treatable   Psycho-social/Spiritual:   Desire for further Chaplaincy support:No  Prognosis:   < 6 months poor in the setting of pancreatic cancer, diabetes, discontinuation of chemotherapy treatments, poor p.o. intake, immobility, altered mental status.  Discharge Planning: Home with Hospice at patient's request.  Primary Diagnoses: Present on Admission: . Closed intertrochanteric fracture of right hip, initial encounter ([place])   I have reviewed the medical record, interviewed the patient and family, and examined the patient. The following aspects are pertinent.  Past Medical History:  Diagnosis Date  . BPH (benign prostatic hyperplasia)   . Closed fracture of right hip ([place])   . Diabetes mellitus without complication ([place])   . Incontinence   . Nocturia   . OAB (overactive bladder)   . Pancreatic mass   . Prostate cancer [facility])    Social History   Socioeconomic History  . Marital status: Married    Spouse name: Not on file  . Number of children: Not on file  . Years of education: Not on file  . Highest education level: Not on file  Occupational History  . Not on file  Social Needs  . Financial resource strain: Not on file  . Food insecurity:    Worry: Not on file    Inability: Not on file  . Transportation needs:    Medical: Not on file    Non-medical: Not on file  Tobacco Use  . Smoking status: Former Smoker    Last attempt to quit: 03/15/1967    Years since quitting: 50.8  . Smokeless tobacco: Former Systems developer    Types: Chew  . Tobacco comment: quit long time  Substance and Sexual Activity  . Alcohol use: No    Alcohol/week: 0.0 oz  . Drug use: No  . Sexual activity: Never  Lifestyle  . Physical activity:    Days per week: Not on file    Minutes per session: Not  on file  . Stress: Not on file  Relationships  . Social connections:    Talks on phone: Not on file    Gets together: Not on file    Attends religious service: Not on file    Active member of club or organization: Not on file    Attends meetings of clubs or organizations: Not on file    Relationship status: Not on file  Other Topics Concern  . Not on file  Social History Narrative  . Not on file   Family History  Problem Relation Age of Onset  . Prostate cancer Brother   . Bladder Cancer Brother   . Pancreatic cancer Brother   . Prostate cancer Brother   . Lymphoma Mother   . Leukemia Father   . Breast cancer Sister   . Uterine cancer Sister   . Kidney cancer Neg Hx    Scheduled Meds: . amoxicillin-clavulanate  1 tablet Oral Q12H  . apixaban  5 mg Oral BID  . bacitracin   Topical BID  . cholecalciferol  2,000 Units Oral Daily  . doxazosin  2 mg Oral Daily  . DULoxetine  20 mg Oral Daily  . insulin aspart  0-15 Units Subcutaneous TID WC  . magnesium hydroxide  30 mL Oral Daily  . multivitamin with minerals  1 tablet Oral Daily  . pantoprazole  40 mg Oral BID  . senna-docusate  2 tablet Oral BID  . sertraline  50 mg Oral Daily  . spironolactone  25 mg Oral Daily  . traZODone  50 mg Oral QHS  . triamcinolone ointment  1 application Topical BID  . vitamin B-12  1,000 mcg Oral Q M,W,F   Continuous Infusions: . sodium chloride 75 mL/hr (12/22/17 0552)  . methocarbamol (ROBAXIN)  IV     PRN Meds:.ALPRAZolam, bisacodyl **OR**  bisacodyl, HYDROmorphone (DILAUDID) injection, hydrOXYzine, methocarbamol **OR** methocarbamol (ROBAXIN)  IV, metoCLOPramide **OR** metoCLOPramide (REGLAN) injection, ondansetron **OR** ondansetron (ZOFRAN) IV, oxyCODONE, senna-docusate, sodium phosphate, traMADol Medications Prior to Admission:  Prior to Admission medications   Medication Sig Start Date End Date Taking? Authorizing Provider  acetaminophen (TYLENOL) 325 MG tablet Take 2 tablets (650  mg total) by mouth every 6 (six) hours as needed for mild pain (or Fever >/= 101). 12/14/17  Yes [place] Lighter, MD  ALPRAZolam Duanne Moron) 0.25 MG tablet Take 1 tablet (0.25 mg total) by mouth at bedtime as needed for anxiety. 12/01/17  Yes Lloyd Huger, MD  amoxicillin-clavulanate (AUGMENTIN) 875-125 MG tablet Take 1 tablet by mouth every 12 (twelve) hours for 10 days. 12/14/17 12/24/17 Yes [place] Lighter, MD  apixaban (ELIQUIS) 5 MG TABS tablet Take 2 tablets twice a a day until 12/18/17 and then 1 tablet twice a day after that 12/14/17  Yes [place] Lighter, MD  bacitracin ointment Apply topically 2 (two) times daily. Apply to the blistered area on the medial left ankle once or twice a day 12/14/17  Yes [place] Lighter, MD  Cholecalciferol (VITAMIN D3) 2000 units capsule Take 2,000 Units by mouth daily.  03/05/17  Yes [provider]  doxazosin (CARDURA) 2 MG tablet Take 2 mg by mouth daily.  12/31/16 12/31/17 Yes [provider]  DULoxetine (CYMBALTA) 20 MG capsule Take 1 capsule by mouth daily. 09/12/17  Yes [provider]  HYDROcodone-acetaminophen (NORCO/VICODIN) 5-325 MG tablet Take 1 tablet by mouth every 6 (six) hours as needed for moderate pain or severe pain. 12/14/17  Yes [place] Lighter, MD  hydrOXYzine (VISTARIL) 25 MG capsule Take 12.5 mg by mouth 3 (three) times daily as needed for itching.   Yes [provider]  LEVEMIR FLEXTOUCH 100 UNIT/ML Pen Inject 5 Units into the skin 2 (two) times daily. 12/14/17  Yes [place] Lighter, MD  magnesium hydroxide (MILK OF MAGNESIA) 400 MG/5ML suspension Take 30 mLs by mouth daily.   Yes [provider]  megestrol (MEGACE) 40 MG tablet Take 40 mg by mouth daily. 10/30/17  Yes [provider]  Multiple Vitamin (MULTI-VITAMINS) TABS Take 1 tablet by mouth daily.    Yes [provider]  ondansetron (ZOFRAN) 4 MG tablet Take 1 tablet (4 mg total) by mouth every 8 (eight) hours as needed  for nausea or vomiting. 10/27/17  Yes Lloyd Huger, MD  pantoprazole (PROTONIX) 40 MG tablet TAKE 1 TABLET TWICE A DAY 02/08/15  Yes [provider]  polyethylene glycol (MIRALAX / GLYCOLAX) packet Take 17 g by mouth daily as needed for mild constipation. 12/14/17  Yes [place] Lighter, MD  prochlorperazine (COMPAZINE) 10 MG tablet Take 1 tablet (10 mg total) by mouth every 6 (six) hours as needed (Nausea or vomiting). 10/16/17  Yes Lloyd Huger, MD  protein supplement shake (PREMIER PROTEIN) LIQD Take 325 mLs (11 oz total) by mouth 2 (two) times daily between meals. 09/28/17  Yes Wieting, Richard, MD  senna-docusate (SENOKOT-S) 8.6-50 MG tablet Take 1 tablet by mouth 2 (two) times daily.   Yes [provider]  sertraline (ZOLOFT) 50 MG tablet Take 1 tablet by mouth daily. 07/14/17  Yes [provider]  spironolactone (ALDACTONE) 25 MG tablet Take 25 mg by mouth daily.   Yes [provider]  traZODone (DESYREL) 50 MG tablet Take 50 mg by mouth at bedtime.  12/29/16  Yes [provider]  triamcinolone ointment (KENALOG) 0.5 % Apply 1 application topically  2 (two) times daily. Do not apply to the blister part on the left ankle 12/14/17  Yes [place] Lighter, MD  vitamin B-12 (CYANOCOBALAMIN) 1000 MCG tablet Take 1,000 mcg by mouth.    Yes [provider]   Allergies  Allergen Reactions  . Sulfa Antibiotics Nausea And Vomiting   Review of Systems  Unable to perform ROS: Dementia    Physical Exam  Constitutional: Vital signs are normal.  Cardiovascular: Normal rate, regular rhythm, normal heart sounds, intact distal pulses and normal pulses.  Pulmonary/Chest: Effort normal. He has decreased breath sounds.  Abdominal: Soft. Normal appearance and bowel sounds are normal.  Musculoskeletal:       Right hip: He exhibits tenderness.  Generalized weakness, s/p right hip surgery   Neurological: He is alert. He is disoriented.  Skin:  Bruising noted.  Bruising to right hip surgical area   Psychiatric: His speech is normal. Cognition and memory are impaired. He expresses inappropriate judgment.  Nursing note and vitals reviewed.  Vital Signs: BP 117/61 (BP Location: Right Arm)   Pulse (!) 106   Temp 98.1 F (36.7 C)   Resp 19   Ht 5' 10"  (1.778 m)   Wt 70.8 kg (156 lb)   SpO2 98%   BMI 22.38 kg/m  Pain Scale: PAINAD POSS *See Group Information*: 1-Acceptable,Awake and alert Pain Score: Asleep   SpO2: SpO2: 98 % O2 Device:SpO2: 98 % O2 Flow Rate: .O2 Flow Rate (L/min): 2 L/min  IO: Intake/output summary:   Intake/Output Summary (Last 24 hours) at 12/24/2017 1710 Last data filed at 12/24/2017 1300 Gross per 24 hour  Intake 600 ml  Output -  Net 600 ml    LBM: Last BM Date: 12/24/17 Baseline Weight: Weight: 64.9 kg (143 lb) Most recent weight: Weight: 70.8 kg (156 lb)     Palliative Assessment/Data: PPS 30%    Time In: 1530 Time Out: 1700 Time Total: 90 min Greater than 50%  of this time was spent counseling and coordinating care related to the above assessment and plan.  Signed by: Alda Lea, NP-BC Palliative Medicine Team  Phone: [phone] Fax: [phone]   Please contact Palliative Medicine Team phone at [phone] for questions and concerns.  For individual provider: See Shea Evans

## 2017-12-24 NOTE — Progress Notes (Signed)
PT Cancellation Note  Patient Details Name: Gerald Alvarez. MRN: [id number] DOB: [birth year]   Cancelled Treatment:    Reason Eval/Treat Not Completed: Other (comment). Treatment attempted, however MD and RN in room for extended time. Unable to treat at this time. Will re-attempt.   Sinan Tuch 12/24/2017, 9:24 AM Greggory Stallion, PT, DPT [phone]

## 2017-12-24 NOTE — Progress Notes (Signed)
Physical Therapy Treatment Patient Details Name: Gerald Alvarez. MRN: [id number] DOB: Jan 12, 1926 Today's Date: 12/24/2017    History of Present Illness Pt admitted for R hip fracture s/p IM nailing 12/22/17. History includes dementia, pancreatic cancer, DM, and prostate cancer. Of note, previous admission on 4/4 secondary to L LE DVT and cellulitis. IVC filter placed prior to ortho Sx. L heel debrided 12/22/17 AM by podiatry.    PT Comments    Pt is not making progress towards goals this date due to cognition. Pt very lethargic all day, family agreeable to therapy attempt. Pt able to arouse easily, however has difficulty staying awake for treatment, constantly needs active engagement. Has difficulty following commands this date, not appropriate for OOB mobility attempts. Will re-attempt next date.   Follow Up Recommendations  SNF     Equipment Recommendations  Rolling walker with 5" wheels;3in1 (PT)    Recommendations for Other Services       Precautions / Restrictions Precautions Precautions: Fall Restrictions Weight Bearing Restrictions: Yes RLE Weight Bearing: Weight bearing as tolerated    Mobility  Bed Mobility               General bed mobility comments: not safe to perform this date  Transfers                    Ambulation/Gait                 Stairs             Wheelchair Mobility    Modified Rankin (Stroke Patients Only)       Balance                                            Cognition Arousal/Alertness: Lethargic Behavior During Therapy: WFL for tasks assessed/performed Overall Cognitive Status: Impaired/Different from baseline                                        Exercises Other Exercises Other Exercises: supine ther-ex performed on R LE including ankle pumps, SLRs, hip abd/add, and SAQ. Pt very lethargic and quickly falls asleep. Able to participate in first few reps, however  then fatigues and it becomes AAROM. 31 Reps performed with mod/max assist    General Comments        Pertinent Vitals/Pain Pain Assessment: Faces Faces Pain Scale: Hurts a little bit Pain Location: R hip Pain Descriptors / Indicators: Operative site guarding;Grimacing Pain Intervention(s): Limited activity within patient's tolerance;Repositioned    Home Living                      Prior Function            PT Goals (current goals can now be found in the care plan section) Acute Rehab PT Goals Patient Stated Goal: to improve mobility PT Goal Formulation: With patient Time For Goal Achievement: 01/05/18 Potential to Achieve Goals: Fair Progress towards PT goals: Not progressing toward goals - comment(secondary to lethargy)    Frequency    BID      PT Plan Current plan remains appropriate    Co-evaluation              AM-PAC PT "6 Clicks" Daily Activity  Outcome Measure  Difficulty turning over in bed (including adjusting bedclothes, sheets and blankets)?: Unable Difficulty moving from lying on back to sitting on the side of the bed? : Unable Difficulty sitting down on and standing up from a chair with arms (e.g., wheelchair, bedside commode, etc,.)?: Unable Help needed moving to and from a bed to chair (including a wheelchair)?: Total Help needed walking in hospital room?: Total Help needed climbing 3-5 steps with a railing? : Total 6 Click Score: 6    End of Session   Activity Tolerance: Patient limited by lethargy;Treatment limited secondary to medical complications (Comment) Patient left: in bed;with bed alarm set;with family/visitor present Nurse Communication: Mobility status PT Visit Diagnosis: Unsteadiness on feet (R26.81);Other abnormalities of gait and mobility (R26.89);Muscle weakness (generalized) (M62.81);History of falling (Z91.81);Pain Pain - Right/Left: Right Pain - part of body: Hip     Time: 1420-1440 PT Time Calculation  (min) (ACUTE ONLY): 20 min  Charges:  $Therapeutic Exercise: 8-22 mins                    G Codes:       Gerald Alvarez, PT, DPT [phone]    Gerald Alvarez 12/24/2017, 2:59 PM

## 2017-12-24 NOTE — Progress Notes (Signed)
   Subjective: 3 Days Post-Op Procedure(s) (LRB): INTRAMEDULLARY (IM) NAIL INTERTROCHANTRIC (Right) Patient sleeping this am.  Nursing noted patient was agitated most of the night did not fall asleep until 5 AM. Patient has been using tramadol sparingly. We will continue physical therapy today.    Objective: Vital signs in last 24 hours: Temp:  [97.7 F (36.5 C)-98.6 F (37 C)] 98.6 F (37 C) (04/18 0809) Pulse Rate:  [86-100] 89 (04/18 0809) Resp:  [18-19] 19 (04/18 0451) BP: (125-153)/(64-70) 125/70 (04/18 0809) SpO2:  [92 %-97 %] 95 % (04/18 0809)  Intake/Output from previous day: 04/17 0701 - 04/18 0700 In: 720 [P.O.:720] Out: 50 [Urine:50] Intake/Output this shift: Total I/O In: 240 [P.O.:240] Out: -   Recent Labs    12/22/17 0433 12/23/17 0301 12/24/17 0427  HGB 7.7* 8.2* 8.9*   Recent Labs    12/23/17 0301 12/24/17 0427  WBC 9.1 8.1  RBC 2.43* 2.63*  HCT 24.2* 26.5*  PLT 208 237   Recent Labs    12/23/17 0301 12/24/17 0427  NA 135 136  K 3.7 3.7  CL 108 107  CO2 24 25  BUN 14 15  CREATININE 0.51* 0.52*  GLUCOSE 132* 165*  CALCIUM 7.5* 7.5*   No results for input(s): LABPT, INR in the last 72 hours.  EXAM General - Patient is Sleeping Extremity - Neurovascular intact Sensation intact distally Intact pulses distally Dorsiflexion/Plantar flexion intact  Increasing ecchymosis along the inner aspect of the thigh and posterior thigh.  Thigh is soft. Dressing -mild drainage along the incision site.  Drainage improved from yesterday. Motor Function - intact, moving foot and toes well on exam.   Past Medical History:  Diagnosis Date  . BPH (benign prostatic hyperplasia)   . Closed fracture of right hip ([place])   . Diabetes mellitus without complication ([place])   . Incontinence   . Nocturia   . OAB (overactive bladder)   . Pancreatic mass   . Prostate cancer ([place])     Assessment/Plan:   3 Days Post-Op Procedure(s) (LRB): INTRAMEDULLARY (IM)  NAIL INTERTROCHANTRIC (Right) Active Problems:   Closed intertrochanteric fracture of right hip, initial encounter ([place])   Acute post op blood loss anemia with underlying chronic anemia  Estimated body mass index is 22.38 kg/m as calculated from the following:   Height as of this encounter: 5\' 10"  (1.778 m).   Weight as of this encounter: 70.8 kg (156 lb). Advance diet Up with therapy  Needs BM Pain controlled. VSS Proximal incision with mild drainage. Will apply new dressing today Acute post op blood loss anemia with underlying chronic anemia -hemoglobin trending up, stable, 8.9. Agitation/confusion recommend using limited tramadol.  Prefer using Tylenol for pain.  Chest x-ray urinalysis ordered to rule out for signs of infection.   DVT Prophylaxis - Foot Pumps and eliquis Weight-Bearing as tolerated to right leg   T. Rachelle Hora, PA-C [place] 12/24/2017, 3:52 PM

## 2017-12-25 ENCOUNTER — Telehealth: Payer: Self-pay | Admitting: *Deleted

## 2017-12-25 DIAGNOSIS — Z66 Do not resuscitate: Secondary | ICD-10-CM

## 2017-12-25 DIAGNOSIS — R638 Other symptoms and signs concerning food and fluid intake: Secondary | ICD-10-CM

## 2017-12-25 DIAGNOSIS — Z515 Encounter for palliative care: Secondary | ICD-10-CM

## 2017-12-25 LAB — COMPREHENSIVE METABOLIC PANEL
ALT: 12 U/L — AB (ref 17–63)
AST: 19 U/L (ref 15–41)
Albumin: 2.2 g/dL — ABNORMAL LOW (ref 3.5–5.0)
Alkaline Phosphatase: 51 U/L (ref 38–126)
Anion gap: 4 — ABNORMAL LOW (ref 5–15)
BILIRUBIN TOTAL: 0.9 mg/dL (ref 0.3–1.2)
BUN: 15 mg/dL (ref 6–20)
CALCIUM: 7.5 mg/dL — AB (ref 8.9–10.3)
CO2: 25 mmol/L (ref 22–32)
CREATININE: 0.52 mg/dL — AB (ref 0.61–1.24)
Chloride: 107 mmol/L (ref 101–111)
GFR calc non Af Amer: >60 mL/min (ref >60)
GLUCOSE: 165 mg/dL — AB (ref 65–99)
Potassium: 3.7 mmol/L (ref 3.5–5.1)
SODIUM: 136 mmol/L (ref 135–145)
Total Protein: 5.3 g/dL — ABNORMAL LOW (ref 6.5–8.1)

## 2017-12-25 LAB — GLUCOSE, CAPILLARY
GLUCOSE-CAPILLARY: 158 mg/dL — AB (ref 65–99)
GLUCOSE-CAPILLARY: 110 mg/dL — AB (ref 65–99)
Glucose-Capillary: 146 mg/dL — ABNORMAL HIGH (ref 65–99)
Glucose-Capillary: 135 mg/dL — ABNORMAL HIGH (ref 65–99)

## 2017-12-25 MED ORDER — VANCOMYCIN HCL 10 G IV SOLR
1250.0000 mg | INTRAVENOUS | Status: DC
  Filled 2017-12-25: qty 1250

## 2017-12-25 MED ORDER — AMOXICILLIN-POT CLAVULANATE 875-125 MG PO TABS
1.0000 | ORAL_TABLET | Freq: Two times a day (BID) | ORAL | Status: DC
  Administered 2017-12-25 – 2017-12-26 (×2): 1 via ORAL
  Filled 2017-12-25 (×2): qty 1

## 2017-12-25 NOTE — Progress Notes (Signed)
Patient ID: Gerald Angst., male   DOB: 1926/05/18, 84 y.o.   MRN: [id number]  This NP visited patient at the bedside as a follow up to  yesterday's Gerald Alvarez, palliative medicine needs and emotional support.  Patient is out of bed in the chair and wife is at bedside.  Wife shares with me her husband's medical attorney over the last 6 months.  At this time plan is for discharge home with hospice.  Wife tells me that they have out-of-pocket caregivers in place to help with nursing care at home.  She asked questions regarding "what to expect".    Discussed the natural trajectory and expectations at end of life.  Discussed the utilization of medication for symptom management specific to pain and agitation.  -Recommend minimizing medications to  those that only enhance comfort and address symptoms  Discussed  with wife the importance of self-care as she continues in this caregiver role.  Discussed with wife hospice benefit and encouraged her to continue conversation with the hospice caregivers regarding plan of care.  Questions and concerns addressed   Discussed with Dr Gerald Alvarez  Total time spent on the unit was 15 minutes  Greater than 50% of the time was spent in counseling and coordination of care  Gerald Lessen NP  Palliative Medicine Team Team Phone # [phone] Pager [phone]

## 2017-12-25 NOTE — Progress Notes (Addendum)
Pharmacy Antibiotic Note  Gerald Alvarez. is a 84 y.o. male admitted on 12/19/2017 with pneumonia.  Pharmacy has been consulted for vancomycin and Zosyn dosing.  Plan: Zosyn 3.375g IV q8h (4 hour infusion).   DW 71 kg  Vd 50L kei 0.057 hr-1  T1/2 12 hours Vancomycin 1250 mg q 18 hours ordered with stacked dosing. Level before 5th dose. Goal trough 15-20.  Height: 5\' 10"  (177.8 cm) Weight: 156 lb (70.8 kg) IBW/kg (Calculated) : 73  Temp (24hrs), Avg:98.1 F (36.7 C), Min:97.7 F (36.5 C), Max:98.6 F (37 C)  Recent Labs  Lab 12/19/17 2210 12/22/17 0433 12/23/17 0301 12/24/17 0427  WBC 4.6 7.4 9.1 8.1  CREATININE 0.72 0.56* 0.51* 0.52*    Estimated Creatinine Clearance: 62.7 mL/min (A) (by C-G formula based on SCr of 0.52 mg/dL (L)).    Allergies  Allergen Reactions  . Sulfa Antibiotics Nausea And Vomiting    Antimicrobials this admission: Vancomycin, Zosyn  >>    >>   Dose adjustments this admission:   Microbiology results: 4/14 MRSA PCR: (-) Vanc d/c per hospitalist.  Thank you for allowing pharmacy to be a part of this patient's care.  Bee Hammerschmidt S 12/25/2017 12:34 AM

## 2017-12-25 NOTE — Care Management Note (Addendum)
Case Management Note  Patient Details  Name: Gerald Alvarez. MRN: [id number] Date of Birth: 16-Jul-1926  Subjective/Objective:  Met with wife and 2 daughters at bedside. Offered choice of hospices. They prefer. [facility]. Referral called to [facility] at [phone]. Bed ordered. Waiting to here back to see if bed can be delivered today. Faxed all needed paperwork to [place] at [phone]. Family does want to take patient home until tomorrow. They stated they were told by palliative care that hospice would come out and do an assement prior to patient going home.I explained that hospice would come out after patient was discharged. RNCM would have all equipment delivered prior to discharge. They are cooperative and pleasant. RNCM waiting on return call from [facility] to see if bed can be delivered today.Patient will discharge by EMS              EMS form and face sheet placed on chart      Action/Plan: Return call from [place] at hospice. Bed can be delivered this afternoon.   Expected Discharge Date:  12/25/17               Expected Discharge Plan:  Home w Hospice Care  In-House Referral:     Discharge planning Services  CM Consult  Post Acute Care Choice:    Choice offered to:  Spouse, Adult Children  DME Arranged:    DME Agency:     HH Arranged:  Disease Management [place] Agency:  [facility]  Status of Service:  In process, will continue to follow  If discussed at Long Length of Stay Meetings, dates discussed:    Additional Comments:  Jolly Mango, RN 12/25/2017, 10:24 AM

## 2017-12-25 NOTE — Progress Notes (Addendum)
[facility] [place] NAME: Gerald Alvarez    MR#:  [id number]  DATE OF BIRTH:  April 20, 1926  SUBJECTIVE:   patient was less confused last night.  Daughter and wife at bedside.  Agreeable with hospice care at home REVIEW OF SYSTEMS:   Review of Systems  Unable to perform ROS: Dementia   Tolerating Diet:yes Tolerating PT: STR/SNF DRUG ALLERGIES:   Allergies  Allergen Reactions  . Sulfa Antibiotics Nausea And Vomiting    VITALS:  Blood pressure 130/71, pulse (!) 112, temperature 97.8 F (36.6 C), temperature source Oral, resp. rate 19, height 5\' 10"  (1.778 m), weight 70.8 kg (156 lb), SpO2 95 %.  PHYSICAL EXAMINATION:   Physical Exam  GENERAL:  84 y.o.-year-old patient lying in the bed with no acute distress.  EYES: Pupils equal, round, reactive to light and accommodation. No scleral icterus. Extraocular muscles intact.  HEENT: Head atraumatic, normocephalic. Oropharynx and nasopharynx clear.  NECK:  Supple, no jugular venous distention. No thyroid enlargement, no tenderness.  LUNGS: Moderate breath sounds bilaterally, no wheezing, rales, rhonchi. No use of accessory muscles of respiration.  CARDIOVASCULAR: S1, S2 normal. No murmurs, rubs, or gallops.  ABDOMEN: Soft, nontender, nondistended. Bowel sounds present. No organomegaly or mass.  EXTREMITIES: No cyanosis, clubbing or edema b/l. Rt leg surgery site with blood/brusing NEUROLOGIC: Cranial nerves II through XII are intact. No focal Motor or sensory deficits b/l.   PSYCHIATRIC:  patient is alert  SKIN: No obvious rash, lesion, or ulcer.  LABORATORY PANEL:  CBC Recent Labs  Lab 12/24/17 0427  WBC 8.1  HGB 8.9*  HCT 26.5*  PLT 237    Chemistries  Recent Labs  Lab 12/24/17 0427  NA 136  K 3.7  CL 107  CO2 25  GLUCOSE 165*  BUN 15  CREATININE 0.52*  CALCIUM 7.5*  MG 2.0  AST 19  ALT 12*  ALKPHOS 51  BILITOT 0.9   Cardiac Enzymes No results for input(s):  TROPONINI in the last 168 hours. RADIOLOGY:  Dg Chest Port 1 View  Result Date: 12/24/2017 CLINICAL DATA:  New onset cough. EXAM: PORTABLE CHEST 1 VIEW COMPARISON:  12/19/2017. FINDINGS: PowerPort catheter with lead tip noted over the right atrium. Cardiomegaly with normal pulmonary vascularity. Low lung volumes with mild basilar atelectasis/infiltrates. Surgical clips right upper quadrant. Vascular stent and IVC filter noted over the right abdomen. IMPRESSION: 1.  PowerPort catheter with tip over the superior vena cava. 2.  Low lung volumes with mild bibasilar atelectasis/infiltrates. Electronically Signed   By: Marcello Moores  Register   On: 12/24/2017 10:25   ASSESSMENT AND PLAN:  Gerald Alvarez  is a 84 y.o. 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  1.) Intertroch femur Fx: s/p repair - d/w Dr Leim Fabry - s/p IVC filter on lt side considering high risk for thromboembolism by vascular  2.) LLE DVT: - Patient has short segment popliteal left DVT.  - Resumed eliquis  - s/p IVC filter placement before ortho surgery  3.) LLE cellulitis: c/w Augmentin (stop date 12/24/2017), topical Bacitracin + Triamcinolone. Wound consult. -Last dose for Augmentin is 12/24/17 - seen by Podiatry on this admission. No further recommendation. outpt f/up  4.)  Acute encephalopathy -multifactorial ; sundowning and probable healthcare associated  pneumonia  Chest x-ray with possible atelectasis versus infiltrate Patient is started on broad-spectrum antibiotics, will change to p.o. Augmentin today   5.) IDDM:  - Continue sliding scale insulin  6.) Pancreatic Ca: poor prognosis, refusing chemotherapy  7.)  Sundowning syndrome: Recommended family members to stay with the patient during the nighttime to reorient  the patient.  Benadryl as needed will be given, family members reported that trazodone is not helping patient  8.) Anx/dep: c/w home Xanax, Cymbalta, Hydroxyzine, Zoloft.  9.) FEN/GI: Diabetic diet, C/w home Protonix.  10.) DVT PPx: on Eliquis  11.) Code Status: DO NOT RESUSCITATE, family has decided to take patient home with hospice care at home after my discussion .  Hospital bed will be delivered at home today and family will make arrangements to take patient home by tomorrow with hospice care at home D/w daughter Angela Nevin and patient's wife   Case discussed with Care Management/Social Worker. Management plans discussed with the patient, daughter and wife at bedside and they are in agreement.  CODE STATUS: full  DVT Prophylaxis: TEDS and SCD  TOTAL TIME TAKING CARE OF THIS PATIENT: 33 minutes.  >50% time spent on counselling and coordination of care  POSSIBLE D/C IN  Am  DAYS, DEPENDING ON CLINICAL CONDITION.  Note: This dictation was prepared with Dragon dictation along with smaller phrase technology. Any transcriptional errors that result from this process are unintentional.  Nicholes Mango M.D on 12/25/2017 at 2:08 PM  Between 7am to 6pm - Pager - [phone]  After 6pm go to [email or web address] - Proofreader  [facility]  Office  [phone]  CC: Primary care physician; Rusty Aus, MDPatient ID: Gerald Angst., male   DOB: 07-20-1926, 84 y.o.   MRN: [id number]

## 2017-12-25 NOTE — Progress Notes (Signed)
   Subjective: 4 Days Post-Op Procedure(s) (LRB): INTRAMEDULLARY (IM) NAIL INTERTROCHANTRIC (Right) Patient reports pain as moderate.  Complaining of right hip discomfort. Patient is well, and has had no acute complaints or problems We will start therapy today.  Plan is to go Palliative care after hospital stay. no nausea and no vomiting Patient denies any chest pains or shortness of breath. Objective: Vital signs in last 24 hours: Temp:  [98.1 F (36.7 C)-99.5 F (37.5 C)] 99.5 F (37.5 C) (04/19 0057) Pulse Rate:  [72-106] 72 (04/19 0057) BP: (117-122)/(60-61) 122/60 (04/19 0057) SpO2:  [95 %-98 %] 95 % (04/19 0057) well approximated incision Heels are non tender and elevated off the bed using rolled towels Intake/Output from previous day: 04/18 0701 - 04/19 0700 In: 5217.5 [P.O.:360; I.V.:4757.5; IV Piggyback:100] Out: 500 [Urine:500] Intake/Output this shift: No intake/output data recorded.  Recent Labs    12/23/17 0301 12/24/17 0427  HGB 8.2* 8.9*   Recent Labs    12/23/17 0301 12/24/17 0427  WBC 9.1 8.1  RBC 2.43* 2.63*  HCT 24.2* 26.5*  PLT 208 237   Recent Labs    12/23/17 0301 12/24/17 0427  NA 135 136  K 3.7 3.7  CL 108 107  CO2 24 25  BUN 14 15  CREATININE 0.51* 0.52*  GLUCOSE 132* 165*  CALCIUM 7.5* 7.5*   No results for input(s): LABPT, INR in the last 72 hours.  EXAM General - Patient is Alert, Appropriate and Oriented Extremity - Neurologically intact Neurovascular intact Sensation intact distally Intact pulses distally Dorsiflexion/Plantar flexion intact No cellulitis present Compartment soft Dressing - Significant serosanguineous drainage Motor Function - intact, moving foot and toes well on exam.    Past Medical History:  Diagnosis Date  . BPH (benign prostatic hyperplasia)   . Closed fracture of right hip ([place])   . Diabetes mellitus without complication ([place])   . Incontinence   . Nocturia   . OAB (overactive bladder)   .  Pancreatic mass   . Prostate cancer ([place])     Assessment/Plan: 4 Days Post-Op Procedure(s) (LRB): INTRAMEDULLARY (IM) NAIL INTERTROCHANTRIC (Right) Active Problems:   Closed intertrochanteric fracture of right hip, initial encounter ([place])  Estimated body mass index is 22.38 kg/m as calculated from the following:   Height as of this encounter: 5\' 10"  (1.778 m).   Weight as of this encounter: 70.8 kg (156 lb). Up with therapy  Labs: none DVT Prophylaxis - Foot Pumps, TED hose and Eliquis Weight-Bearing as tolerated to right leg Change dressing as needed Staples will need to be removed 2 weeks postop followed by the application of benzoin Steri-Strips Will need to follow-up in [place] in 6 weeks with Dr. Dala Dock R. [place] 12/25/2017, 8:32 AM

## 2017-12-25 NOTE — Care Management (Signed)
Dr. Margaretmary Eddy to speak with family rewarding their discharge anxiety.

## 2017-12-25 NOTE — Telephone Encounter (Signed)
t being discharged from hospital and they are requesting Hospice at home. Asking for order from [place]

## 2017-12-25 NOTE — Progress Notes (Signed)
Physical Therapy Treatment Patient Details Name: Gerald Alvarez. MRN: [id number] DOB: Nov 05, 1925 Today's Date: 12/25/2017    History of Present Illness Pt admitted for R hip fracture s/p IM nailing 12/22/17. History includes dementia, pancreatic cancer, DM, and prostate cancer. Of note, previous admission on 4/4 secondary to L LE DVT and cellulitis. IVC filter placed prior to ortho Sx. L heel debrided 12/22/17 AM by podiatry.    PT Comments    Pt presents with deficits in strength, transfers, mobility, gait, balance, and activity tolerance.  Pt required max A with bed mobility and min A with transfers from an elevated EOB.  Pt presented with frequent posterior and R lateral instability while in sitting but responded to cues for weight shifting and was able to maintain independent upright sitting intermittently.  Pt was able to maintain standing balance with a RW with CGA and ambulated at the EOB around 3 feet with fair stability.  Pt will benefit from PT services in a SNF setting upon discharge to safely address above deficits for decreased caregiver assistance and eventual return to PLOF.     Follow Up Recommendations  SNF     Equipment Recommendations  Rolling walker with 5" wheels;3in1 (PT)    Recommendations for Other Services       Precautions / Restrictions Precautions Precautions: Fall Precaution Comments: R chest port Restrictions Weight Bearing Restrictions: Yes RLE Weight Bearing: Weight bearing as tolerated    Mobility  Bed Mobility Overal bed mobility: Needs Assistance Bed Mobility: Sit to Supine     Supine to sit: Max assist     General bed mobility comments: Max assist with sup to sit with extensive verbal and tactile cues for sequencing  Transfers Overall transfer level: Needs assistance Equipment used: Rolling walker (2 wheeled) Transfers: Sit to/from Stand Sit to Stand: Min assist         General transfer comment: Pt required very little  assistance with standing from an elevated EOB but did required extensive cues for sequencing  Ambulation/Gait Ambulation/Gait assistance: Min guard Ambulation Distance (Feet): 3 Feet Assistive device: Rolling walker (2 wheeled) Gait Pattern/deviations: Step-to pattern     General Gait Details: Close CGA during limited amb at the EOB but no LOB   Stairs             Wheelchair Mobility    Modified Rankin (Stroke Patients Only)       Balance Overall balance assessment: History of Falls;Needs assistance Sitting-balance support: Feet supported;Bilateral upper extremity supported Sitting balance-Leahy Scale: Poor Sitting balance - Comments: Intermittent posterior and R lateral lean in sitting requiring assist for upright (otherwise CGA for safety)   Standing balance support: Bilateral upper extremity supported Standing balance-Leahy Scale: Fair                              Cognition Arousal/Alertness: Awake/alert Behavior During Therapy: WFL for tasks assessed/performed Overall Cognitive Status: Impaired/Different from baseline                                        Exercises Total Joint Exercises Ankle Circles/Pumps: AROM;Both;10 reps Quad Sets: Strengthening;Both;10 reps Heel Slides: AROM;Both;5 reps Hip ABduction/ADduction: AROM;Both;10 reps Straight Leg Raises: AROM;AAROM;Both;5 reps Long Arc Quad: AROM;Both;10 reps Knee Flexion: AROM;Both;10 reps Other Exercises Other Exercises: Anterior and L lateral weight shifting in sitting for improved  stability    General Comments        Pertinent Vitals/Pain Pain Assessment: No/denies pain    Home Living                      Prior Function            PT Goals (current goals can now be found in the care plan section) Progress towards PT goals: Progressing toward goals    Frequency    BID      PT Plan Current plan remains appropriate    Co-evaluation               AM-PAC PT "6 Clicks" Daily Activity  Outcome Measure                   End of Session Equipment Utilized During Treatment: Gait belt Activity Tolerance: Patient limited by fatigue Patient left: in chair;with family/visitor present;with SCD's reapplied;with nursing/sitter in room;with call bell/phone within reach Nurse Communication: Mobility status;Other (comment)(Chair alarm not connected) PT Visit Diagnosis: Unsteadiness on feet (R26.81);Other abnormalities of gait and mobility (R26.89);Muscle weakness (generalized) (M62.81);History of falling (Z91.81);Pain Pain - Right/Left: Right Pain - part of body: Hip     Time: 1131-1157 PT Time Calculation (min) (ACUTE ONLY): 26 min  Charges:  $Therapeutic Exercise: 8-22 mins $Therapeutic Activity: 8-22 mins                    G Codes:       DRoyetta Asal PT, DPT 12/25/17, 12:31 PM

## 2017-12-25 NOTE — Telephone Encounter (Signed)
Finn's patient

## 2017-12-25 NOTE — Care Management (Addendum)
This RNCM has assumed care of this patient's transition of care plan.  Per previous RNCM, patient will transition to home tomorrow with hospice to allow hospice to place hospital bed in the home which may be impacted due to unstable weather outside today.  Reserve received call from [facility] with hosp[facility] of [place] [phone] and previous RNCM had notified that MD has cancelled transition to home today. RN updated that discharge order will need to be cancelled if not discharging.

## 2017-12-26 LAB — MAGNESIUM: Magnesium: 1.9 mg/dL (ref 1.7–2.4)

## 2017-12-26 LAB — BASIC METABOLIC PANEL
ANION GAP: 3 — AB (ref 5–15)
BUN: 15 mg/dL (ref 6–20)
CALCIUM: 7.7 mg/dL — AB (ref 8.9–10.3)
CO2: 24 mmol/L (ref 22–32)
CREATININE: 0.54 mg/dL — AB (ref 0.61–1.24)
Chloride: 107 mmol/L (ref 101–111)
GFR calc Af Amer: >60 mL/min (ref >60)
GLUCOSE: 132 mg/dL — AB (ref 65–99)
Potassium: 4 mmol/L (ref 3.5–5.1)
Sodium: 134 mmol/L — ABNORMAL LOW (ref 135–145)

## 2017-12-26 LAB — GLUCOSE, CAPILLARY: GLUCOSE-CAPILLARY: 140 mg/dL — AB (ref 65–99)

## 2017-12-26 LAB — PHOSPHORUS: Phosphorus: 2.9 mg/dL (ref 2.5–4.6)

## 2017-12-26 MED ORDER — SENNOSIDES-DOCUSATE SODIUM 8.6-50 MG PO TABS: 1.0000 | ORAL_TABLET | Freq: Every evening | ORAL | Status: AC | PRN

## 2017-12-26 MED ORDER — OXYCODONE HCL 5 MG PO TABS: 2.5000 mg | ORAL_TABLET | ORAL | 0 refills | Status: AC | PRN

## 2017-12-26 MED ORDER — BISACODYL 5 MG PO TBEC: 5.0000 mg | DELAYED_RELEASE_TABLET | Freq: Every day | ORAL | 0 refills | Status: AC | PRN

## 2017-12-26 MED ORDER — AMOXICILLIN-POT CLAVULANATE 875-125 MG PO TABS: 1.0000 | ORAL_TABLET | Freq: Two times a day (BID) | ORAL | 0 refills | Status: AC

## 2017-12-26 MED ORDER — DIPHENHYDRAMINE HCL 25 MG PO CAPS: 25.0000 mg | ORAL_CAPSULE | Freq: Three times a day (TID) | ORAL | 0 refills | Status: AC | PRN

## 2017-12-26 MED ORDER — TRAMADOL HCL 50 MG PO TABS: 50.0000 mg | ORAL_TABLET | Freq: Four times a day (QID) | ORAL | 0 refills | Status: AC | PRN

## 2017-12-26 MED ORDER — METHOCARBAMOL 500 MG PO TABS: 500.0000 mg | ORAL_TABLET | Freq: Four times a day (QID) | ORAL | 0 refills | Status: AC | PRN

## 2017-12-26 NOTE — Care Management (Signed)
Discharge to home today with [facility] in the home. Will fax discharge summary when available [facility] for transportation Shelbie Ammons RN MSN [facility] [phone]

## 2017-12-26 NOTE — Progress Notes (Signed)
Pharmacy Electrolyte Monitoring Consult:  Pharmacy consulted to assist in monitoring and replacing electrolytes in this 84 y.o. male admitted on 12/19/2017 with Fall   Patient ordered spironolactone 25mg  PO Daily.   Labs:  Sodium (mmol/L)  Date Value  12/26/2017 134 (L)   Potassium (mmol/L)  Date Value  12/26/2017 4.0   Magnesium (mg/dL)  Date Value  12/26/2017 1.9   Phosphorus (mg/dL)  Date Value  12/26/2017 2.9   Calcium (mg/dL)  Date Value  12/26/2017 7.7 (L)   Albumin (g/dL)  Date Value  12/24/2017 2.2 (L)    Plan: Electrolytes WNL this morning. No supplementation needed.  Labs have been stable. Will sign off at this time.   Rocky Morel, PharmD, BCPS Clinical Pharmacist 12/26/2017 7:50 AM

## 2017-12-26 NOTE — Discharge Summary (Signed)
[place] [facility] [place] NAME: Gerald Alvarez    MR#:  [id number]  DATE OF BIRTH:  [birth year]  DATE OF ADMISSION:  12/19/2017 ADMITTING PHYSICIAN: Arta Silence, MD  DATE OF DISCHARGE:  12/26/17  PRIMARY CARE PHYSICIAN: Rusty Aus, MD    ADMISSION DIAGNOSIS:  Pain [R52] Fall, initial encounter [id number]] Closed fracture of right hip, initial encounter ([place]) [S72.001A] Closed intertrochanteric fracture of right hip, initial encounter ([place]) [S72.141A]  DISCHARGE DIAGNOSIS:  Active Problems:   Closed intertrochanteric fracture of right hip, initial encounter [facility])   Palliative care by specialist   Decreased oral intake   DNR (do not resuscitate) FTT   SECONDARY DIAGNOSIS:   Past Medical History:  Diagnosis Date  . BPH (benign prostatic hyperplasia)   . Closed fracture of right hip ([place])   . Diabetes mellitus without complication ([place])   . Incontinence   . Nocturia   . OAB (overactive bladder)   . Pancreatic mass   . Prostate cancer [facility])     HOSPITAL COURSE:   Scholze a89 y.o.malewith a known history of stage IIa pancreatic head adenoCa (s/p CBD stent, now on chemoTx), pancytopenia, Hx prostate Ca (s/p surgery + XRT), IDDM, BPH, OAB, nephrolithiasis, Anx/Dep who p/w R hip fracture Pt is on Eliquis for recent LLE DVT.  1.)  Acute encephalopathy -multifactorial ; sundowning and probable healthcare associated pneumonia  Improved , patient is back to his baseline Chest x-ray with possible atelectasis versus infiltrate Patient is started on broad-spectrum antibiotics,  changed to p.o. Augmentin  12/25/2017 we will continue for a total of 7 days, needs 5 more days of Augmentin  2.) LLE DVT: - Patient has short segment popliteal left DVT.  - Resumed eliquis  - s/p IVC filter placement before ortho surgery  3.) LLE cellulitis: c/w Augmentin (stop date 12/24/2017), topical Bacitracin + Triamcinolone. Wound  consult. -Last dose for Augmentin is 12/24/17 - seen by Podiatry on this admission. No further recommendation. outpt f/up  4.)  Rt Intertroch femur Fx: s/p repair - d/w Dr Leim Fabry - s/p IVC filter on lt side considering high risk for thromboembolism by vascular  5.) IDDM:  - Continue sliding scale insulin  6.) Pancreatic Ca: poor prognosis, refusing chemotherapy  7.)  Sundowning syndrome: Recommended family members to stay with the patient during the nighttime to reorient the patient.  Benadryl as needed will be given, family members reported that trazodone is not helping patient  8.) Anx/dep: c/w home Xanax, Cymbalta, Hydroxyzine, Zoloft.  9.) FEN/GI: Diabetic diet, C/w home Protonix.  10.) DVT PPx: on Eliquis  11.) Code Status: DO NOT RESUSCITATE, family has decided to take patient home with hospice care at home after my discussion .  Hospital bed  delivered at home yesterday  Discharge patient home today family members are agreeable D/w  patient's wife     DISCHARGE CONDITIONS:   FAIR  CONSULTS OBTAINED:  Treatment Team:  Arta Silence, MD Fritzi Mandes, MD Surgeon, Orthopedic, MD Schnier, Dolores Lory, MD Sharlotte Alamo, DPM Leim Fabry, MD   PROCEDURES   DRUG ALLERGIES:   Allergies  Allergen Reactions  . Sulfa Antibiotics Nausea And Vomiting    DISCHARGE MEDICATIONS:   Allergies as of 12/26/2017      Reactions   Sulfa Antibiotics Nausea And Vomiting      Medication List    STOP taking these medications   HYDROcodone-acetaminophen 5-325 MG tablet Commonly known as:  NORCO/VICODIN  LEVEMIR FLEXTOUCH 100 UNIT/ML Pen Generic drug:  Insulin Detemir   MULTI-VITAMINS Tabs   traZODone 50 MG tablet Commonly known as:  DESYREL   vitamin B-12 1000 MCG tablet Commonly known as:  CYANOCOBALAMIN   Vitamin D3 2000 units capsule     TAKE these medications   acetaminophen 325 MG tablet Commonly known as:  TYLENOL Take 2 tablets (650 mg  total) by mouth every 6 (six) hours as needed for mild pain (or Fever >/= 101).   ALPRAZolam 0.25 MG tablet Commonly known as:  XANAX Take 1 tablet (0.25 mg total) by mouth at bedtime as needed for anxiety.   amoxicillin-clavulanate 875-125 MG tablet Commonly known as:  AUGMENTIN Take 1 tablet by mouth every 12 (twelve) hours. What changed:  when to take this   apixaban 5 MG Tabs tablet Commonly known as:  ELIQUIS Take 2 tablets twice a a day until 12/18/17 and then 1 tablet twice a day after that   bacitracin ointment Apply topically 2 (two) times daily. Apply to the blistered area on the medial left ankle once or twice a day   bisacodyl 5 MG EC tablet Commonly known as:  DULCOLAX Take 1 tablet (5 mg total) by mouth daily as needed for moderate constipation.   diphenhydrAMINE 25 mg capsule Commonly known as:  BENADRYL Take 1 capsule (25 mg total) by mouth every 8 (eight) hours as needed for sleep (agitation).   doxazosin 2 MG tablet Commonly known as:  CARDURA Take 2 mg by mouth daily.   DULoxetine 20 MG capsule Commonly known as:  CYMBALTA Take 1 capsule by mouth daily.   hydrOXYzine 25 MG capsule Commonly known as:  VISTARIL Take 12.5 mg by mouth 3 (three) times daily as needed for itching.   magnesium hydroxide 400 MG/5ML suspension Commonly known as:  MILK OF MAGNESIA Take 30 mLs by mouth daily.   megestrol 40 MG tablet Commonly known as:  MEGACE Take 40 mg by mouth daily.   methocarbamol 500 MG tablet Commonly known as:  ROBAXIN Take 1 tablet (500 mg total) by mouth every 6 (six) hours as needed for muscle spasms.   ondansetron 4 MG tablet Commonly known as:  ZOFRAN Take 1 tablet (4 mg total) by mouth every 8 (eight) hours as needed for nausea or vomiting.   oxyCODONE 5 MG immediate release tablet Commonly known as:  Oxy IR/ROXICODONE Take 0.5-1 tablets (2.5-5 mg total) by mouth every 4 (four) hours as needed for moderate pain (pain score 4-6).    pantoprazole 40 MG tablet Commonly known as:  PROTONIX TAKE 1 TABLET TWICE A DAY   polyethylene glycol packet Commonly known as:  MIRALAX / GLYCOLAX Take 17 g by mouth daily as needed for mild constipation.   prochlorperazine 10 MG tablet Commonly known as:  COMPAZINE Take 1 tablet (10 mg total) by mouth every 6 (six) hours as needed (Nausea or vomiting).   protein supplement shake Liqd Commonly known as:  PREMIER PROTEIN Take 325 mLs (11 oz total) by mouth 2 (two) times daily between meals.   senna-docusate 8.6-50 MG tablet Commonly known as:  Senokot-S Take 1 tablet by mouth 2 (two) times daily. What changed:  Another medication with the same name was added. Make sure you understand how and when to take each.   senna-docusate 8.6-50 MG tablet Commonly known as:  Senokot-S Take 1 tablet by mouth at bedtime as needed for mild constipation. What changed:  You were already taking a medication with  the same name, and this prescription was added. Make sure you understand how and when to take each.   sertraline 50 MG tablet Commonly known as:  ZOLOFT Take 1 tablet by mouth daily.   spironolactone 25 MG tablet Commonly known as:  ALDACTONE Take 25 mg by mouth daily.   traMADol 50 MG tablet Commonly known as:  ULTRAM Take 1 tablet (50 mg total) by mouth every 6 (six) hours as needed for severe pain.   triamcinolone ointment 0.5 % Commonly known as:  KENALOG Apply 1 application topically 2 (two) times daily. Do not apply to the blister part on the left ankle        DISCHARGE INSTRUCTIONS:   Follow-up with primary care physician as needed  follow-up with orthopedics Dr. Leim Fabry in  2 weeks  Hospice care at home  DIET:  Cardiac diet  DISCHARGE CONDITION:  Fair  ACTIVITY:  Activity as tolerated  OXYGEN:  Home Oxygen: No.   Oxygen Delivery: room air  DISCHARGE LOCATION:  home   If you experience worsening of your admission symptoms, develop shortness of  breath, life threatening emergency, suicidal or homicidal thoughts you must seek medical attention immediately by calling 911 or calling your MD immediately  if symptoms less severe.  You Must read complete instructions/literature along with all the possible adverse reactions/side effects for all the Medicines you take and that have been prescribed to you. Take any new Medicines after you have completely understood and accpet all the possible adverse reactions/side effects.   Please note  You were cared for by a hospitalist during your hospital stay. If you have any questions about your discharge medications or the care you received while you were in the hospital after you are discharged, you can call the unit and asked to speak with the hospitalist on call if the hospitalist that took care of you is not available. Once you are discharged, your primary care physician will handle any further medical issues. Please note that NO REFILLS for any discharge medications will be authorized once you are discharged, as it is imperative that you return to your primary care physician (or establish a relationship with a primary care physician if you do not have one) for your aftercare needs so that they can reassess your need for medications and monitor your lab values.     Today  Chief Complaint  Patient presents with  . Fall   Patient sustained a fall this morning but did not get hurt as per nurses report.  Resting comfortably.  Patient is at his baseline.  Family members are agreeable to take him home with hospice care today.  Hospital bed was delivered at home yesterday  ROS:  CONSTITUTIONAL: Denies fevers, chills. Denies any fatigue, weakness.  EYES: Denies blurry vision, double vision, eye pain. EARS, NOSE, THROAT: Denies tinnitus, ear pain, hearing loss. RESPIRATORY: Denies cough, wheeze, shortness of breath.  CARDIOVASCULAR: Denies chest pain, palpitations, edema.  GASTROINTESTINAL: Denies nausea,  vomiting, diarrhea, abdominal pain. Denies bright red blood per rectum. HEMATOLOGIC AND LYMPHATIC: Denies easy bruising or bleeding. SKIN: Denies rash or lesion. MUSCULOSKELETAL: Denies pain in neck, back, shoulder, knees,  or arthritic symptoms.  NEUROLOGIC: Denies paralysis, paresthesias.  PSYCHIATRIC: Denies anxiety or depressive symptoms.   VITAL SIGNS:  Blood pressure (!) 143/77, pulse (!) 108, temperature (!) 97.5 F (36.4 C), temperature source Oral, resp. rate 18, height 5\' 10"  (1.778 m), weight 70.8 kg (156 lb), SpO2 97 %.  I/O:  Intake/Output Summary (Last 24 hours) at 12/26/2017 0934 Last data filed at 12/25/2017 1945 Gross per 24 hour  Intake 1352.5 ml  Output 100 ml  Net 1252.5 ml    PHYSICAL EXAMINATION:  GENERAL:  84 y.o.-year-old patient lying in the bed with no acute distress.  EYES: Pupils equal, round, reactive to light and accommodation. No scleral icterus. Extraocular muscles intact.  HEENT: Head atraumatic, normocephalic. Oropharynx and nasopharynx clear.  NECK:  Supple, no jugular venous distention. No thyroid enlargement, no tenderness.  LUNGS: Normal breath sounds bilaterally, no wheezing, rales,rhonchi or crepitation. No use of accessory muscles of respiration.  CARDIOVASCULAR: S1, S2 normal. No murmurs, rubs, or gallops.  ABDOMEN: Soft, non-tender, non-distended. Bowel sounds present. No organomegaly or mass.  EXTREMITIES: Right hip area minimal tenderness no pedal edema, cyanosis, or clubbing.  NEUROLOGIC: Awake and alert oriented x2.  PSYCHIATRIC: The patient is alert and oriented x 3.  SKIN: No obvious rash, lesion, or ulcer.   DATA REVIEW:   CBC Recent Labs  Lab 12/24/17 0427  WBC 8.1  HGB 8.9*  HCT 26.5*  PLT 237    Chemistries  Recent Labs  Lab 12/24/17 0427 12/26/17 0336  NA 136 134*  K 3.7 4.0  CL 107 107  CO2 25 24  GLUCOSE 165* 132*  BUN 15 15  CREATININE 0.52* 0.54*  CALCIUM 7.5* 7.7*  MG 2.0 1.9  AST 19  --   ALT  12*  --   ALKPHOS 51  --   BILITOT 0.9  --     Cardiac Enzymes No results for input(s): TROPONINI in the last 168 hours.  Microbiology Results  Results for orders placed or performed during the hospital encounter of 12/19/17  MRSA PCR Screening     Status: None   Collection Time: 12/20/17  2:46 AM  Result Value Ref Range Status   MRSA by PCR NEGATIVE NEGATIVE Final    Comment:        The GeneXpert MRSA Assay (FDA approved for NASAL specimens only), is one component of a comprehensive MRSA colonization surveillance program. It is not intended to diagnose MRSA infection nor to guide or monitor treatment for MRSA infections. Performed at [facility], [address] [place], [place] [address]     RADIOLOGY:  Dg Chest Port 1 View  Result Date: 12/24/2017 CLINICAL DATA:  New onset cough. EXAM: PORTABLE CHEST 1 VIEW COMPARISON:  12/19/2017. FINDINGS: PowerPort catheter with lead tip noted over the right atrium. Cardiomegaly with normal pulmonary vascularity. Low lung volumes with mild basilar atelectasis/infiltrates. Surgical clips right upper quadrant. Vascular stent and IVC filter noted over the right abdomen. IMPRESSION: 1.  PowerPort catheter with tip over the superior vena cava. 2.  Low lung volumes with mild bibasilar atelectasis/infiltrates. Electronically Signed   By: Marcello Moores  Register   On: 12/24/2017 10:25    EKG:   Orders placed or performed during the hospital encounter of 12/19/17  . ED EKG  . ED EKG  . EKG 12-Lead  . EKG 12-Lead      Management plans discussed with the patient, family and they are in agreement.  CODE STATUS:     Code Status Orders  (From admission, onward)        Start     Ordered   12/24/17 1706  Do not attempt resuscitation (DNR)  Continuous    Question Answer Comment  In the event of cardiac or respiratory ARREST Do not call a "code blue"   In  the event of cardiac or respiratory ARREST Do not perform Intubation, CPR,  defibrillation or ACLS   In the event of cardiac or respiratory ARREST Use medication by any route, position, wound care, and other measures to relive pain and suffering. May use oxygen, suction and manual treatment of airway obstruction as needed for comfort.      12/24/17 1705    Code Status History    Date Active Date Inactive Code Status Order ID Comments User Context   12/20/2017 0123 12/24/2017 1705 Full Code [id number]  Arta Silence, MD ED   12/10/2017 2040 12/15/2017 2033 Full Code [id number]  Salary, Avel Peace, MD Inpatient   09/25/2017 2317 09/28/2017 1716 Full Code [id number]  Dustin Flock, MD Inpatient    Advance Directive Documentation     Most Recent Value  Type of Advance Directive  Healthcare Power of [place], Living will  Pre-existing out of facility DNR order (yellow form or pink MOST form)  -  "MOST" Form in Place?  -      TOTAL TIME TAKING CARE OF THIS PATIENT: 43 minutes.   Note: This dictation was prepared with Dragon dictation along with smaller phrase technology. Any transcriptional errors that result from this process are unintentional.   @[facility] @  on 12/26/2017 at 9:34 AM  Between 7am to 6pm - Pager - [phone]  After 6pm go to [email or web address] - password [email or web address] [facility]  Office  [phone]  CC: Primary care physician; Rusty Aus, MD

## 2017-12-26 NOTE — Discharge Instructions (Signed)
Follow-up with primary care physician as needed  follow-up with orthopedics Dr. Leim Fabry in  2 weeks  Continue hospice care at home

## 2017-12-26 NOTE — Progress Notes (Signed)
Patient had unwitnessed fall, states he did not hit his head. Dr Margaretmary Eddy and Vance Peper PA paged, waiting on callback. AC notified.

## 2017-12-26 NOTE — Progress Notes (Signed)
Pt is in no acute distress. VSS. RN explained discharge care instructions in detail to family at bedside. Family received prescriptions. Verbalized understanding. EMS called to transport pt to home with hospice home care.

## 2017-12-26 NOTE — Progress Notes (Signed)
Physical Therapy Treatment Patient Details Name: Gerald Alvarez. MRN: [id number] DOB: Sep 08, 1926 Today's Date: 12/26/2017    History of Present Illness Pt admitted for R hip fracture s/p IM nailing 12/22/17. History includes dementia, pancreatic cancer, DM, and prostate cancer. Of note, previous admission on 4/4 secondary to L LE DVT and cellulitis. IVC filter placed prior to ortho Sx. L heel debrided 12/22/17 AM by podiatry.    PT Comments    Noted fall this am.  No injuries reported.  Discussed with primary nurse.  Pt in bed asking to use bathroom.  To edge of bed with good effort to initiate but required mod a to raise trunk up off bed.  Sitting with min guard/assist.  Unsafe to be left unattended in sitting.  Stand pivot transfer +2 to commode at bedside. Heavy verbal cues for hand placements and transfers.  Primary nurse in to assist with care.  Stood with walker and min a x 2 from commode.  He was able to stand for care with verbal cues to stand fully. Min a +2 transfer back to bed and mod/max a x 2 to return to supine.  Pt was able to assist with rolling to left for dressing change and does well with supine bridging using LLE to assist for repositioning.  Pt returned to bed after session as he is awaiting EMS for transport home.   Follow Up Recommendations  SNF     Equipment Recommendations  Rolling walker with 5" wheels;3in1 (PT)    Recommendations for Other Services       Precautions / Restrictions Precautions Precautions: Fall Precaution Comments: R chest port Restrictions Weight Bearing Restrictions: Yes RLE Weight Bearing: Weight bearing as tolerated    Mobility  Bed Mobility Overal bed mobility: Needs Assistance Bed Mobility: Supine to Sit;Sit to Supine     Supine to sit: Mod assist Sit to supine: Mod assist;+2 for physical assistance   General bed mobility comments: Good effort to assist with supine to sit today but needs help raising upper body off  bed.  Transfers Overall transfer level: Needs assistance Equipment used: Rolling walker (2 wheeled) Transfers: Sit to/from Stand Sit to Stand: Min assist;+2 safety/equipment Stand pivot transfers: Mod assist;+2 physical assistance       General transfer comment: +2 assist for safety.  Ambulation/Gait   Ambulation Distance (Feet): 3 Feet Assistive device: Rolling walker (2 wheeled) Gait Pattern/deviations: Step-to pattern Gait velocity: decreased Gait velocity interpretation: <1.8 ft/sec, indicate of risk for recurrent falls General Gait Details: gait limited to commode to bed.   Stairs             Wheelchair Mobility    Modified Rankin (Stroke Patients Only)       Balance Overall balance assessment: History of Falls;Needs assistance Sitting-balance support: Feet supported;Bilateral upper extremity supported Sitting balance-Leahy Scale: Poor     Standing balance support: Bilateral upper extremity supported Standing balance-Leahy Scale: Poor Standing balance comment: assist for upright d/t R lean                            Cognition Arousal/Alertness: Awake/alert Behavior During Therapy: WFL for tasks assessed/performed Overall Cognitive Status: Impaired/Different from baseline                                        Exercises Total Joint Exercises Ankle  Circles/Pumps: AROM;Both;10 reps Quad Sets: Strengthening;Both;10 reps Heel Slides: AROM;Both;10 reps Hip ABduction/ADduction: AROM;Both;10 reps Straight Leg Raises: AROM;AAROM;Both;10 reps Other Exercises Other Exercises: to commode per his request.  Nursing in to assist with care.    General Comments        Pertinent Vitals/Pain Pain Assessment: No/denies pain Pain Location: denies pain when asked but does c/o some pain sitting on commode.   Pain Descriptors / Indicators: Operative site guarding;Grimacing Pain Intervention(s): Limited activity within patient's  tolerance;Monitored during session    Home Living                      Prior Function            PT Goals (current goals can now be found in the care plan section) Progress towards PT goals: Progressing toward goals    Frequency    BID      PT Plan Current plan remains appropriate    Co-evaluation              AM-PAC PT "6 Clicks" Daily Activity  Outcome Measure  Difficulty turning over in bed (including adjusting bedclothes, sheets and blankets)?: Unable Difficulty moving from lying on back to sitting on the side of the bed? : Unable Difficulty sitting down on and standing up from a chair with arms (e.g., wheelchair, bedside commode, etc,.)?: Unable Help needed moving to and from a bed to chair (including a wheelchair)?: A Lot Help needed walking in hospital room?: Total Help needed climbing 3-5 steps with a railing? : Total 6 Click Score: 7    End of Session Equipment Utilized During Treatment: Gait belt Activity Tolerance: Patient tolerated treatment well;Patient limited by fatigue Patient left: in bed;with bed alarm set;with call bell/phone within reach Nurse Communication: Other (comment) Pain - Right/Left: Right Pain - part of body: Hip     Time: 6644-0347 PT Time Calculation (min) (ACUTE ONLY): 25 min  Charges:  $Therapeutic Exercise: 8-22 mins $Therapeutic Activity: 8-22 mins                    G Codes:       Chesley Noon, PTA 12/26/17, 10:47 AM

## 2017-12-26 NOTE — Progress Notes (Signed)
   Subjective: 5 Days Post-Op Procedure(s) (LRB): INTRAMEDULLARY (IM) NAIL INTERTROCHANTRIC (Right) Patient sleeping and unable to communicate in regards to pain level. Patient is well, and has had no acute complaints or problems Fair with physical therapy yesterday.  Was able to ambulate 3 feet Plan is to go With palliative care after hospital stay. no nausea and no vomiting Patient denies any chest pains or shortness of breath. Objective: Vital signs in last 24 hours: Temp:  [97.5 F (36.4 C)-97.8 F (36.6 C)] 97.8 F (36.6 C) (04/20 0030) Pulse Rate:  [85-112] 85 (04/20 0030) Resp:  [18] 18 (04/19 1706) BP: (127-150)/(64-78) 127/64 (04/20 0030) SpO2:  [95 %-100 %] 100 % (04/20 0030) well approximated incision Heels are non tender and elevated off the bed using rolled towels Intake/Output from previous day: 04/19 0701 - 04/20 0700 In: 1352.5 [P.O.:560; I.V.:792.5] Out: 100 [Urine:100] Intake/Output this shift: Total I/O In: 80 [P.O.:80] Out: -   Recent Labs    12/24/17 0427  HGB 8.9*   Recent Labs    12/24/17 0427  WBC 8.1  RBC 2.63*  HCT 26.5*  PLT 237   Recent Labs    12/24/17 0427 12/26/17 0336  NA 136 134*  K 3.7 4.0  CL 107 107  CO2 25 24  BUN 15 15  CREATININE 0.52* 0.54*  GLUCOSE 165* 132*  CALCIUM 7.5* 7.7*   No results for input(s): LABPT, INR in the last 72 hours.  EXAM General - Patient is Disorganized and Confused Extremity - Neurovascular intact Intact pulses distally No cellulitis present Compartment soft Significant bruising to the entire posterior right flank hip and thigh region.  Unchanged from prior visit Dressing - moderate drainage Motor Function - intact, moving foot and toes well on exam.    Past Medical History:  Diagnosis Date  . BPH (benign prostatic hyperplasia)   . Closed fracture of right hip ([place])   . Diabetes mellitus without complication ([place])   . Incontinence   . Nocturia   . OAB (overactive bladder)   .  Pancreatic mass   . Prostate cancer ([place])     Assessment/Plan: 5 Days Post-Op Procedure(s) (LRB): INTRAMEDULLARY (IM) NAIL INTERTROCHANTRIC (Right) Active Problems:   Closed intertrochanteric fracture of right hip, initial encounter [facility])   Palliative care by specialist   Decreased oral intake   DNR (do not resuscitate)  Estimated body mass index is 22.38 kg/m as calculated from the following:   Height as of this encounter: 5\' 10"  (1.778 m).   Weight as of this encounter: 70.8 kg (156 lb). Up with therapy Discharge home with home health when medically cleared  Labs: Hemoglobin 8.9 which is up from 8.2 DVT Prophylaxis - Foot Pumps, TED hose and Eliquis Weight-Bearing as tolerated to right leg Change dressing prior to patient being discharged Staples will need to be removed 2 weeks postop followed by the application of benzoin Steri-Strips Will need to follow-up in [place] in 6 weeks with Dr. Dala Dock R. Starke Pennington 12/26/2017, 6:52 AM

## 2017-12-26 NOTE — Progress Notes (Signed)
Call insurance company to get prior authorization for Eliquis, it was approved, for 2 months.  Case number is [id number], patient can get it filled tonight and start taking.  RN Raquel Sarna will call and notify the family members to fill the prescription  Total time spent with insurance company for prior authorization-33 minutes

## 2017-12-26 NOTE — Progress Notes (Signed)
Wife called RN after discharge to say that pharmacy cannot dispense the eliquis without authorization. MD notified and Called RN. Plan is to have a family member come to floor tomorrow AM at 0830 to pick up coupon for eliquis from [place]. Pt wife called by RN and asked if they could do this, and wife said they will. RN told wife to follow up with the pt primary doctor as well on the prescription for eliquis. Wife repeated all this education back to RN and confirmed understanding. Pt did have a dose of eliquis this AM.

## 2017-12-26 NOTE — Progress Notes (Signed)
RN notified family of authorization

## 2017-12-28 NOTE — Telephone Encounter (Signed)
VO called to hospice ok for services per Dr Grayland Ormond

## 2017-12-28 NOTE — Telephone Encounter (Signed)
Not a problem.

## 2018-01-04 ENCOUNTER — Telehealth: Payer: Self-pay | Admitting: *Deleted

## 2018-01-04 NOTE — Telephone Encounter (Signed)
He's on Hospice isn't he?  I'm not sure what she is asking.

## 2018-01-04 NOTE — Telephone Encounter (Signed)
Daughter called asking for a return call to update her on her fathers condition. Please return her call [phone]

## 2018-01-04 NOTE — Telephone Encounter (Signed)
I called hospice and they will have his nurse call her

## 2018-02-06 DEATH — deceased

## 2018-03-25 ENCOUNTER — Ambulatory Visit: Payer: Medicare Other

## 2019-10-10 IMAGING — CT CT ABD-PELV W/ CM
2 of 5 series · 15 of 46 positions shown, 17 images · IV contrast (APPLIED)
Comparison: 08/05/2017.  And 10/16/2016

CLINICAL DATA: Jaundice.  Biliary obstruction.

EXAM:
CT ABDOMEN AND PELVIS WITH CONTRAST
TECHNIQUE: Multidetector CT imaging of the abdomen and pelvis was performed
using the standard protocol following bolus administration of
intravenous contrast.
CONTRAST:  100mL [id number] IOPAMIDOL ([id number]) INJECTION 61%

[Series 2: routine abd/pel with · axial · 0.80mm/px · z∈[-1095,-695]mm · 12 of 91 slices shown, 14 images]
[im 6/91  soft-tissue]
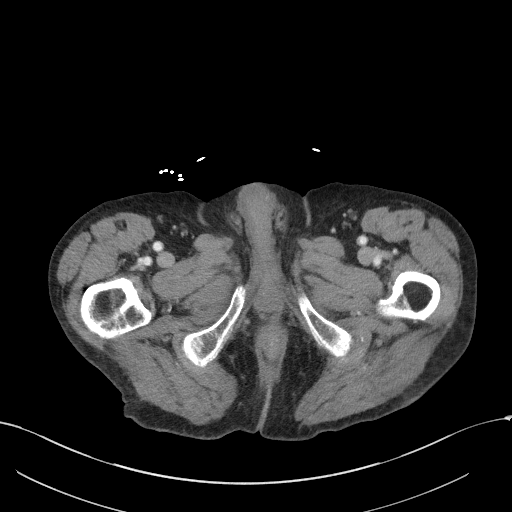
[im 6/91  bone]
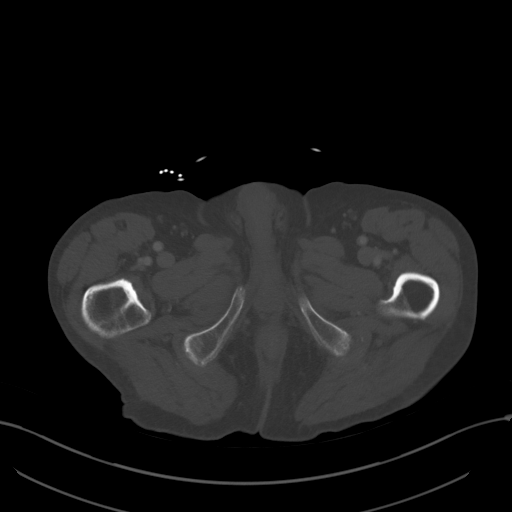
[im 16/91  soft-tissue]
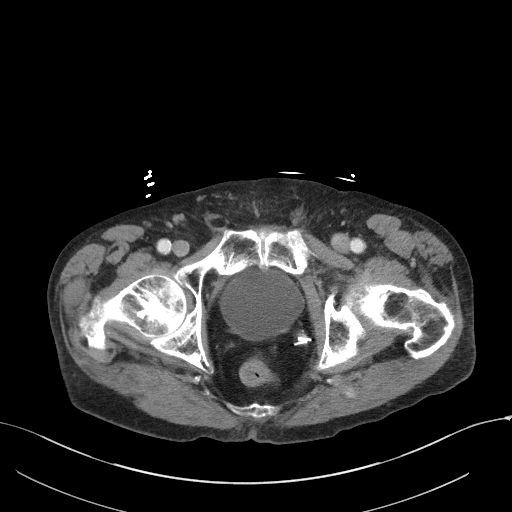
[im 21/91  soft-tissue]
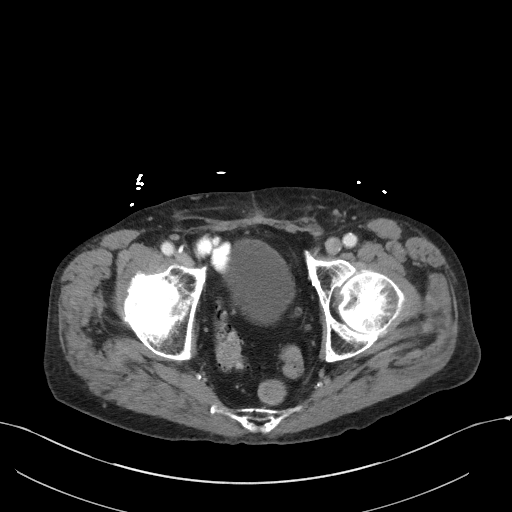
[im 26/91  soft-tissue]
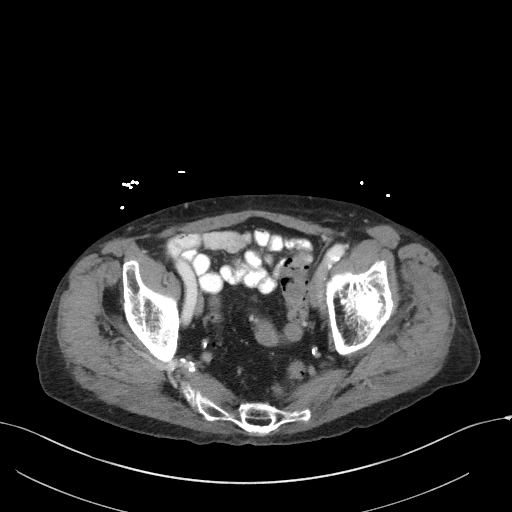
[im 36/91  soft-tissue]
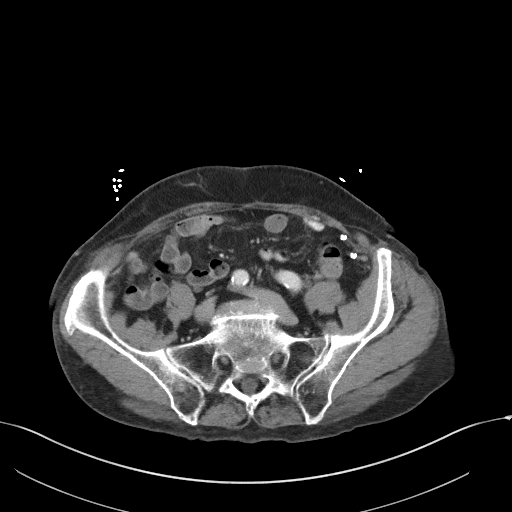
[im 41/91  soft-tissue]
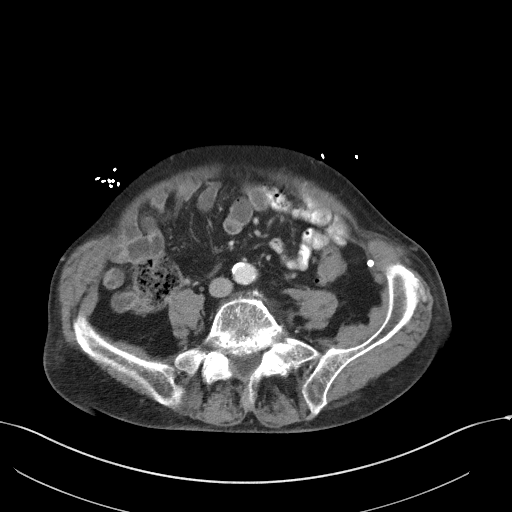
[im 51/91  soft-tissue]
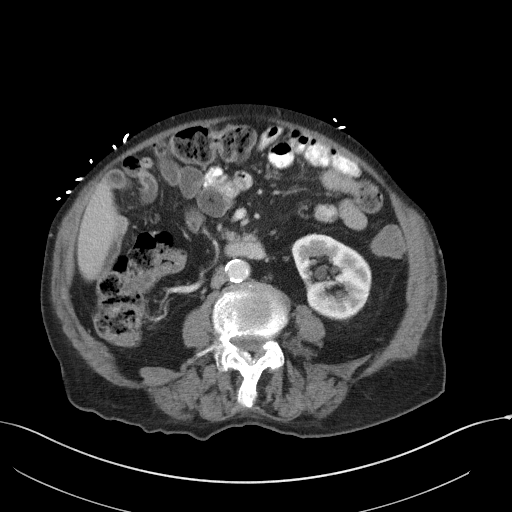
[im 56/91  soft-tissue]
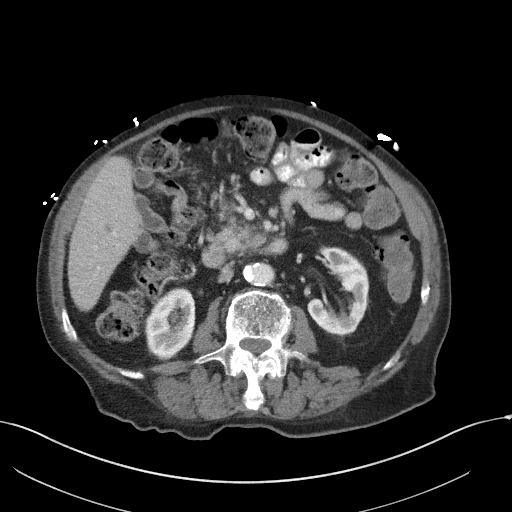
[im 66/91  soft-tissue]
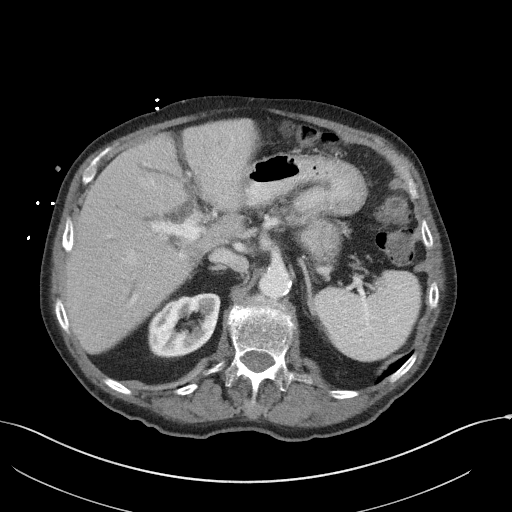
[im 66/91  bone]
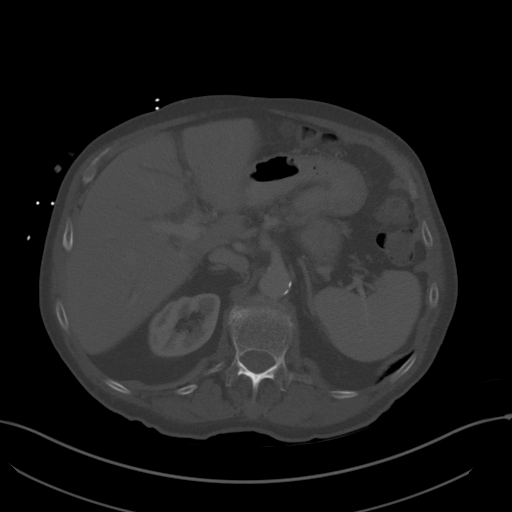
[im 71/91  soft-tissue]
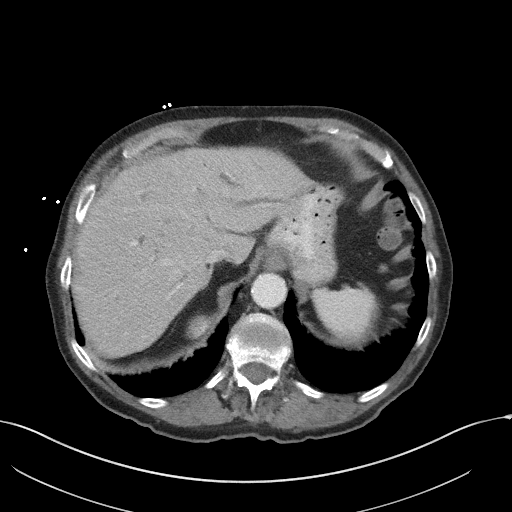
[im 76/91  soft-tissue]
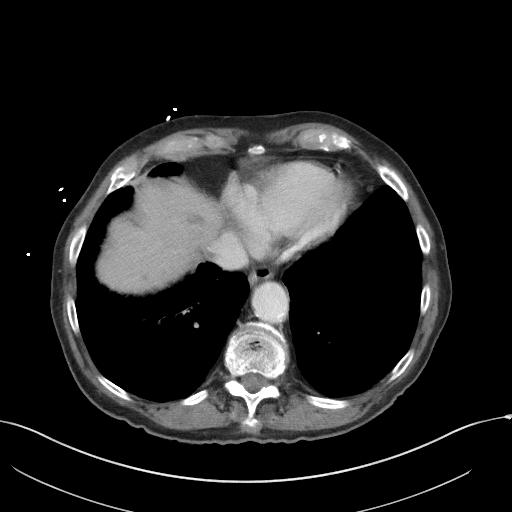
[im 86/91  soft-tissue]
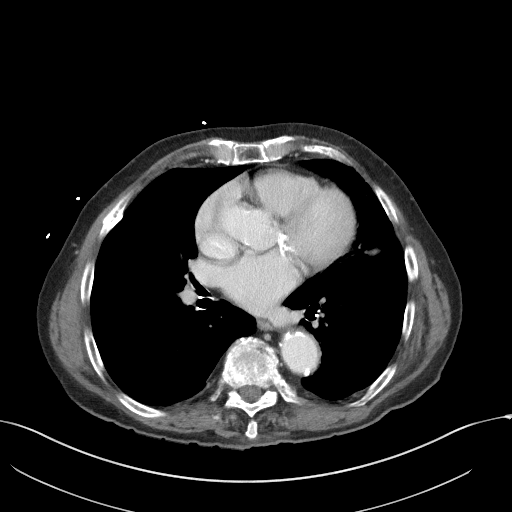

[Series 5: coronal st · coronal · 0.72mm/px · 3 of 91 slices shown]
[im 31/91  soft-tissue]
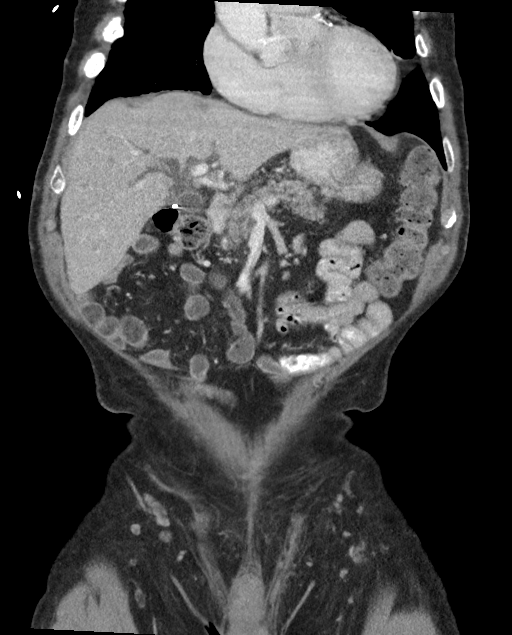
[im 41/91  soft-tissue]
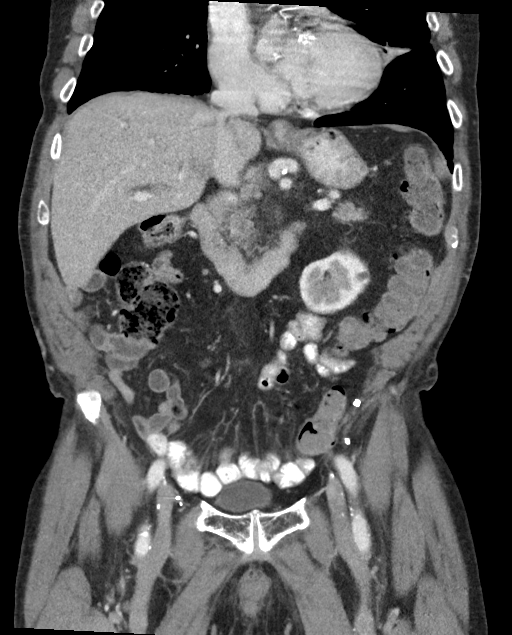
[im 51/91  soft-tissue]
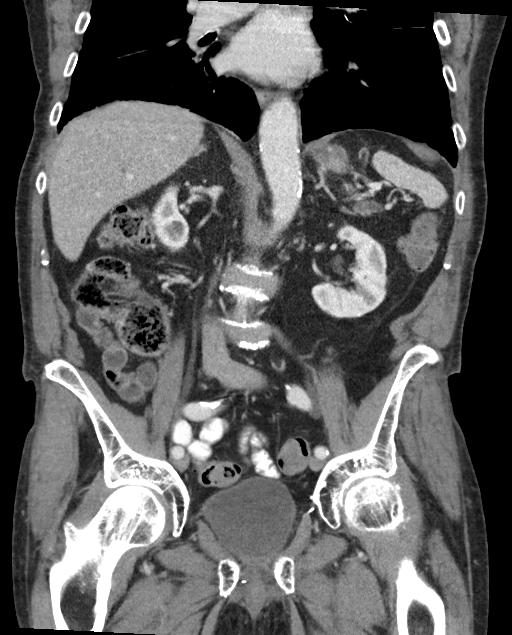

[15 of 46 positions shown; findings below may reference images not displayed]

FINDINGS: Lower chest: No pleural or pericardial effusion. The lung bases
appear clear. Calcifications within the LAD coronary artery
identified. Aortic atherosclerosis noted.

Hepatobiliary: Along the dome of the liver within the medial segment
of left lobe there is a 1.6 cm ill-defined low-attenuation lesion
measuring 1.5 cm, image 16 of series 2. A second lesion is
identified within the medial right lobe of liver measuring 2.6 x
cm. These are not significantly changed when compared with 10/16/16.
Status post cholecystectomy. There is moderate to marked
intrahepatic biliary dilatation. The common bile duct is increased
in caliber measuring 1.2 cm.

Pancreas: There is abrupt cut off of the common bile duct at the
level of the head of pancreas where there is a focal area of
enhancement measuring 1.8 cm, image 36 of series 2 and image 40 of
series 5. main duct dilatation measuring up to 5 mm is identified,
image 28 of series 2. There is mild atrophy of the body and tail of
pancreas.

Spleen: Normal in size without focal abnormality.

Adrenals/Urinary Tract: The adrenal glands are normal.

Normal adrenal glands. Small bilateral kidney lesions are too small
to reliably characterize. No hydronephrosis is identified. Urinary
bladder appears within normal limits.

Stomach/Bowel: No pathologic dilatation of the small or large bowel
loops. The appendix is visualized and appears normal. Distal colonic
diverticula noted without acute inflammation.

Vascular/Lymphatic: Aortic atherosclerosis. No aneurysm. No upper
abdominal adenopathy. No pelvic or inguinal adenopathy.

Reproductive: Previous prostatectomy.

Other: No free fluid or fluid collections. No peritoneal nodularity
identified.

Musculoskeletal: Probable bone island noted within the left iliac
bone. No suspicious bone lesions identified. Age-indeterminate T10
compression deformity is new from 10/16/2016. Unchanged T12
compression fracture. Moderate to advanced degenerative disc disease
is noted throughout the lumbar spine.
IMPRESSION: 1. Interval development of moderate to marked intrahepatic biliary
dilatation with increase caliber of the common bile duct, new from
08/05/2017. There is an abrupt cut off of the common bile duct at
the level of the head of pancreas. A suspicious area of enhancement
at the level of the cut off is identified. Findings are suspicious
for lesion within head of pancreas. Further evaluation with contrast
enhanced pancreas protocol is advised.
2. There are 2 ill-defined lesions within the liver. These are
indeterminate but are unchanged when compared with 10/16/2016. These
can may be further characterized with MRI.
3.  Aortic Atherosclerosis ([id number]).
4. T10 and T12 compression fractures.
